# Patient Record
Sex: Female | Born: 1975 | Race: White | Hispanic: No | Marital: Married | Smoking: Former smoker
Health system: Southern US, Community
[De-identification: ages and names within clinical notes are randomized; demographics above are authoritative.]

## PROBLEM LIST (undated history)

## (undated) ENCOUNTER — Inpatient Hospital Stay (HOSPITAL_COMMUNITY): Payer: Self-pay

## (undated) DIAGNOSIS — F419 Anxiety disorder, unspecified: Secondary | ICD-10-CM

## (undated) DIAGNOSIS — Z789 Other specified health status: Secondary | ICD-10-CM

## (undated) HISTORY — PX: NECK SURGERY: SHX720

## (undated) HISTORY — PX: KNEE SURGERY: SHX244

---

## 2012-12-01 ENCOUNTER — Other Ambulatory Visit (HOSPITAL_COMMUNITY)
Admission: RE | Admit: 2012-12-01 | Discharge: 2012-12-01 | Disposition: A | Discharge status: Home / Self Care | Payer: BC Managed Care – PPO | Source: Ambulatory Visit | Attending: Family Medicine | Admitting: Family Medicine

## 2012-12-01 DIAGNOSIS — Z01419 Encounter for gynecological examination (general) (routine) without abnormal findings: Secondary | ICD-10-CM | POA: Insufficient documentation

## 2012-12-03 ENCOUNTER — Other Ambulatory Visit: Payer: Self-pay | Admitting: Family Medicine

## 2012-12-03 DIAGNOSIS — R102 Pelvic and perineal pain: Secondary | ICD-10-CM

## 2012-12-09 ENCOUNTER — Ambulatory Visit
Admission: RE | Admit: 2012-12-09 | Discharge: 2012-12-09 | Disposition: A | Discharge status: Home / Self Care | Payer: BC Managed Care – PPO | Source: Ambulatory Visit | Attending: Family Medicine | Admitting: Family Medicine

## 2012-12-09 DIAGNOSIS — R102 Pelvic and perineal pain: Secondary | ICD-10-CM

## 2013-05-01 ENCOUNTER — Inpatient Hospital Stay (HOSPITAL_COMMUNITY)
Admission: AD | Admit: 2013-05-01 | Discharge: 2013-05-01 | Disposition: A | Discharge status: Home / Self Care | Payer: BC Managed Care – PPO | Source: Ambulatory Visit | Attending: Obstetrics and Gynecology | Admitting: Obstetrics and Gynecology

## 2013-05-01 ENCOUNTER — Inpatient Hospital Stay (HOSPITAL_COMMUNITY): Payer: BC Managed Care – PPO

## 2013-05-01 ENCOUNTER — Encounter (HOSPITAL_COMMUNITY): Payer: Self-pay | Admitting: *Deleted

## 2013-05-01 DIAGNOSIS — O3410 Maternal care for benign tumor of corpus uteri, unspecified trimester: Secondary | ICD-10-CM | POA: Insufficient documentation

## 2013-05-01 DIAGNOSIS — D259 Leiomyoma of uterus, unspecified: Secondary | ICD-10-CM | POA: Insufficient documentation

## 2013-05-01 DIAGNOSIS — O209 Hemorrhage in early pregnancy, unspecified: Secondary | ICD-10-CM | POA: Insufficient documentation

## 2013-05-01 DIAGNOSIS — O99330 Smoking (tobacco) complicating pregnancy, unspecified trimester: Secondary | ICD-10-CM | POA: Insufficient documentation

## 2013-05-01 DIAGNOSIS — R109 Unspecified abdominal pain: Secondary | ICD-10-CM | POA: Insufficient documentation

## 2013-05-01 HISTORY — DX: Other specified health status: Z78.9

## 2013-05-01 LAB — CBC
HEMATOCRIT: 39.3 % (ref 36.0–46.0)
HEMOGLOBIN: 13.8 g/dL (ref 12.0–15.0)
MCH: 34.2 pg — AB (ref 26.0–34.0)
MCHC: 35.1 g/dL (ref 30.0–36.0)
MCV: 97.5 fL (ref 78.0–100.0)
Platelets: 263 10*3/uL (ref 150–400)
RBC: 4.03 MIL/uL (ref 3.87–5.11)
RDW: 11.6 % (ref 11.5–15.5)
WBC: 8.3 10*3/uL (ref 4.0–10.5)

## 2013-05-01 LAB — HCG, QUANTITATIVE, PREGNANCY: hCG, Beta Chain, Quant, S: 1533 m[IU]/mL — ABNORMAL HIGH (ref <5)

## 2013-05-01 LAB — ABO/RH: ABO/RH(D): O NEG

## 2013-05-01 MED ORDER — RHO D IMMUNE GLOBULIN 1500 UNIT/2ML IJ SOLN
300.0000 ug | Freq: Once | INTRAMUSCULAR | Status: AC
  Administered 2013-05-01: 300 ug via INTRAMUSCULAR
  Filled 2013-05-01: qty 2

## 2013-05-01 NOTE — Progress Notes (Signed)
J.Ethier PA in earlier to discuss test results and d/c plan. Written material on Rhophylac given to pt earlier and questions answered. Written and verbal d/c instructions given and understanding voiced.

## 2013-05-01 NOTE — MAU Note (Signed)
About 1100 on Sat was spotting like pink color. That continued throughout the day until night time bleeding bright red and more like a period

## 2013-05-01 NOTE — Discharge Instructions (Signed)
Vaginal Bleeding During Pregnancy, First Trimester °A small amount of bleeding (spotting) from the vagina is relatively common in early pregnancy. It usually stops on its own. Various things may cause bleeding or spotting in early pregnancy. Some bleeding may be related to the pregnancy, and some may not. In most cases, the bleeding is normal and is not a problem. However, bleeding can also be a sign of something serious. Be sure to tell your health care provider about any vaginal bleeding right away. °Some possible causes of vaginal bleeding during the first trimester include: °· Infection or inflammation of the cervix. °· Growths (polyps) on the cervix. °· Miscarriage or threatened miscarriage. °· Pregnancy tissue has developed outside of the uterus and in a fallopian tube (tubal pregnancy). °· Tiny cysts have developed in the uterus instead of pregnancy tissue (molar pregnancy). °HOME CARE INSTRUCTIONS  °Watch your condition for any changes. The following actions may help to lessen any discomfort you are feeling: °· Follow your health care provider's instructions for limiting your activity. If your health care provider orders bed rest, you may need to stay in bed and only get up to use the bathroom. However, your health care provider may allow you to continue light activity. °· If needed, make plans for someone to help with your regular activities and responsibilities while you are on bed rest. °· Keep track of the number of pads you use each day, how often you change pads, and how soaked (saturated) they are. Write this down. °· Do not use tampons. Do not douche. °· Do not have sexual intercourse or orgasms until approved by your health care provider. °· If you pass any tissue from your vagina, save the tissue so you can show it to your health care provider. °· Only take over-the-counter or prescription medicines as directed by your health care provider. °· Do not take aspirin because it can make you  bleed. °· Keep all follow-up appointments as directed by your health care provider. °SEEK MEDICAL CARE IF: °· You have any vaginal bleeding during any part of your pregnancy. °· You have cramps or labor pains. °SEEK IMMEDIATE MEDICAL CARE IF:  °· You have severe cramps in your back or belly (abdomen). °· You have a fever, not controlled by medicine. °· You pass large clots or tissue from your vagina. °· Your bleeding increases. °· You feel lightheaded or weak, or you have fainting episodes. °· You have chills. °· You are leaking fluid or have a gush of fluid from your vagina. °· You pass out while having a bowel movement. °MAKE SURE YOU: °· Understand these instructions. °· Will watch your condition. °· Will get help right away if you are not doing well or get worse. °Document Released: 11/06/2004 Document Revised: 11/17/2012 Document Reviewed: 10/04/2012 °ExitCare® Patient Information ©2014 [employer]. ° °

## 2013-05-01 NOTE — MAU Provider Note (Signed)
History     CSN: [id number]  Arrival date and time: 05/01/13 5465   First Provider Initiated Contact with Patient 05/01/13 0207      Chief Complaint  Patient presents with  . Vaginal Bleeding  . Abdominal Cramping   HPI Ms. Hannah Bullock is a 38 y.o. G1P0 at [redacted]w[redacted]d who presents to MAU today with complaint of vaginal bleeding. The patient states that she had light bleeding since Saturday morning that became heavier since about 2100 Saturday. She has very mild occasional cramps. She denies previous bleeding episodes during this pregnancy. She rates her pain at 1/10. She denies vaginal discharge, UTI symptoms, fever or N/V/D or constipation.   OB History   Grav Para Term Preterm Abortions TAB SAB Ect Mult Living   1               Past Medical History  Diagnosis Date  . Medical history non-contributory     Past Surgical History  Procedure Laterality Date  . Knee surgery      Family History  Problem Relation Age of Onset  . Heart disease Father   . Heart disease Paternal Grandfather     History  Substance Use Topics  . Smoking status: Current Some Day Smoker    Types: Cigarettes  . Smokeless tobacco: Not on file  . Alcohol Use: No    Allergies: No Known Allergies  Prescriptions prior to admission  Medication Sig Dispense Refill  . Prenatal Vit-Fe Fumarate-FA (PRENATAL MULTIVITAMIN) TABS tablet Take 1 tablet by mouth daily at 12 noon.        Review of Systems  Constitutional: Negative for fever and malaise/fatigue.  Gastrointestinal: Positive for abdominal pain. Negative for nausea, vomiting, diarrhea and constipation.  Genitourinary: Negative for dysuria, urgency and frequency.       Neg - vaginal discharge + vaginal bleeding  Neurological: Negative for dizziness and weakness.   Physical Exam   Blood pressure 139/86, pulse 85, temperature 98.1 F (36.7 C), resp. rate 20, height 5\' 4"  (1.626 m), weight 144 lb (65.318 kg), last menstrual period  03/13/2013.  Physical Exam  Constitutional: She is oriented to person, place, and time. She appears well-developed and well-nourished. No distress.  HENT:  Head: Normocephalic and atraumatic.  Cardiovascular: Normal rate, regular rhythm and normal heart sounds.   Respiratory: Effort normal and breath sounds normal. No respiratory distress.  GI: Soft. Bowel sounds are normal. She exhibits no distension and no mass. There is no tenderness. There is no rebound and no guarding.  Genitourinary: Cervix exhibits discharge (active bleeding from the cervical os). Cervix exhibits no motion tenderness and no friability. There is bleeding (scant blood in the vaginal vault) around the vagina. No vaginal discharge found.  Neurological: She is alert and oriented to person, place, and time.  Skin: Skin is warm and dry. No erythema.  Psychiatric: She has a normal mood and affect.  Cervix: closed, thick  Results for orders placed during the hospital encounter of 05/01/13 (from the past 24 hour(s))  CBC     Status: Abnormal   Collection Time    05/01/13 12:40 AM      Result Value Ref Range   WBC 8.3  4.0 - 10.5 K/uL   RBC 4.03  3.87 - 5.11 MIL/uL   Hemoglobin 13.8  12.0 - 15.0 g/dL   HCT 39.3  36.0 - 46.0 %   MCV 97.5  78.0 - 100.0 fL   MCH 34.2 (*) 26.0 - 34.0 pg  MCHC 35.1  30.0 - 36.0 g/dL   RDW 11.6  11.5 - 15.5 %   Platelets 263  150 - 400 K/uL  ABO/RH     Status: None   Collection Time    05/01/13 12:40 AM      Result Value Ref Range   ABO/RH(D) O NEG    HCG, QUANTITATIVE, PREGNANCY     Status: Abnormal   Collection Time    05/01/13 12:40 AM      Result Value Ref Range   hCG, Beta Chain, Quant, S 1533 (*) <5 mIU/mL  RH IG WORKUP (INCLUDES ABO/RH)     Status: None   Collection Time    05/01/13 12:40 AM      Result Value Ref Range   Gestational Age(Wks) 5     ABO/RH(D) O NEG     Antibody Screen NEG     Unit Number [id number]     Blood Component Type RHIG     Unit division 00      Status of Unit ALLOCATED     Transfusion Status OK TO TRANSFUSE     US Ob Comp Less 14 Wks  05/01/2013   CLINICAL DATA:  38 year old G1, LMP 03/13/2013 (7 weeks 0 days), quantitative beta HCG 1,533, presenting with vaginal bleeding.  EXAM: OBSTETRIC <14 WK [place] AND TRANSVAGINAL OB US  TECHNIQUE: Both transabdominal and transvaginal ultrasound examinations were performed for complete evaluation of the gestation as well as the maternal uterus, adnexal regions, and pelvic cul-de-sac. Transvaginal technique was performed to assess early pregnancy.  COMPARISON:  None.  FINDINGS: Intrauterine gestational sac: Single, oval and slightly irregular in shape.  Yolk sac:  Not present.  Embryo:  Not present.  Cardiac Activity: Not applicable.  MSD:  4.5 mm   5 w   0  d  [place] EDC: 01/01/2014.  Maternal uterus/adnexae: No evidence of subchorionic hemorrhage. Left ovary normal in appearance measuring approximately 3.0 x 1.5 x 2.1 cm, containing small follicular cysts. Right ovary normal in appearance measuring approximately 3.1 x 2.3 x 1.9 cm. Approximate 0.9 x 0.8 x 0.9 cm homogeneous solid hypoechoic mass in the left adnexal region immediately adjacent to the uterus. At least 3 measurable uterine fibroids, one in the left uterine body measuring approximately 2.3 x 2.2 x 2.2 cm, another in the right fundus measuring approximately 3.9 x 3.4 x 3.9 cm, and a 3rd in the right uterine body measuring approximately 2.4 x 2.9 x 2.6 cm.  IMPRESSION: 1. Single intrauterine gestational sac, slightly irregular in its shape, without evidence of a yolk sac or embryo at this time. Mean sac diameter yields a gestational age of [redacted] weeks 0 days. 2. Normal-appearing ovaries bilaterally. 3. At least 3 measurable uterine fibroids, measurements given above. 4. Approximate 1 cm homogeneous solid mass in the left adnexal region, query endometrioma.   Electronically Signed   By: Evangeline Dakin M.D.   On: 05/01/2013 02:18   US Ob  Transvaginal  05/01/2013   CLINICAL DATA:  38 year old G1, LMP 03/13/2013 (7 weeks 0 days), quantitative beta HCG 1,533, presenting with vaginal bleeding.  EXAM: OBSTETRIC <14 WK [place] AND TRANSVAGINAL OB US  TECHNIQUE: Both transabdominal and transvaginal ultrasound examinations were performed for complete evaluation of the gestation as well as the maternal uterus, adnexal regions, and pelvic cul-de-sac. Transvaginal technique was performed to assess early pregnancy.  COMPARISON:  None.  FINDINGS: Intrauterine gestational sac: Single, oval and slightly irregular in shape.  Yolk sac:  Not present.  Embryo:  Not present.  Cardiac Activity: Not applicable.  MSD:  4.5 mm   5 w   0  d  [place] EDC: 01/01/2014.  Maternal uterus/adnexae: No evidence of subchorionic hemorrhage. Left ovary normal in appearance measuring approximately 3.0 x 1.5 x 2.1 cm, containing small follicular cysts. Right ovary normal in appearance measuring approximately 3.1 x 2.3 x 1.9 cm. Approximate 0.9 x 0.8 x 0.9 cm homogeneous solid hypoechoic mass in the left adnexal region immediately adjacent to the uterus. At least 3 measurable uterine fibroids, one in the left uterine body measuring approximately 2.3 x 2.2 x 2.2 cm, another in the right fundus measuring approximately 3.9 x 3.4 x 3.9 cm, and a 3rd in the right uterine body measuring approximately 2.4 x 2.9 x 2.6 cm.  IMPRESSION: 1. Single intrauterine gestational sac, slightly irregular in its shape, without evidence of a yolk sac or embryo at this time. Mean sac diameter yields a gestational age of [redacted] weeks 0 days. 2. Normal-appearing ovaries bilaterally. 3. At least 3 measurable uterine fibroids, measurements given above. 4. Approximate 1 cm homogeneous solid mass in the left adnexal region, query endometrioma.   Electronically Signed   By: Evangeline Dakin M.D.   On: 05/01/2013 02:18    MAU Course  Procedures None  MDM CBC, ABO/Rh, quant hCG and [place] today Discussed patient with Dr.  Simona Huh. [place] for discharge after Rhogam injection given. Follow-up in the office Discussed results and precautions with the patient and her husband. Questions answered. ~ 20 minutes spent on patient education.  Rhogam given Assessment and Plan  A: IUGS at [redacted]w[redacted]d without YS or FP Multiple uterine fibroids Vaginal bleeding in pregnancy prior to [redacted] weeks gestation  P: Discharge home Bleeding/Ectopic precautions discussed Patient advised to follow-up in the office or call if her condition were to change or worsen Patient may return to MAU as needed or if her condition were to change or worsen  Farris Has, PA-C  05/01/2013, 3:11 AM

## 2013-05-02 LAB — RH IG WORKUP (INCLUDES ABO/RH)
ABO/RH(D): O NEG
ANTIBODY SCREEN: NEGATIVE
Gestational Age(Wks): 5
UNIT DIVISION: 0

## 2013-12-12 ENCOUNTER — Encounter (HOSPITAL_COMMUNITY): Payer: Self-pay | Admitting: *Deleted

## 2014-03-06 ENCOUNTER — Encounter (HOSPITAL_COMMUNITY): Payer: Self-pay | Admitting: *Deleted

## 2015-06-26 ENCOUNTER — Other Ambulatory Visit: Payer: Self-pay | Admitting: Orthopedic Surgery

## 2015-06-26 DIAGNOSIS — M542 Cervicalgia: Secondary | ICD-10-CM

## 2015-06-27 ENCOUNTER — Ambulatory Visit
Admission: RE | Admit: 2015-06-27 | Discharge: 2015-06-27 | Disposition: A | Discharge status: Home / Self Care | Payer: BLUE CROSS/BLUE SHIELD | Source: Ambulatory Visit | Attending: Orthopedic Surgery | Admitting: Orthopedic Surgery

## 2015-06-27 DIAGNOSIS — M542 Cervicalgia: Secondary | ICD-10-CM

## 2017-02-20 IMAGING — MR MR CERVICAL SPINE W/O CM
5 series · 28 of 48 positions shown · non-contrast
Comparison: None.

CLINICAL DATA: 40-year-old female with cervical neck pain radiating
to the left upper extremity including the shoulder for 1.5 months.
Numbness in the left hand. Status post steroid injection 1 month
ago. Initial encounter.

EXAM:
MRI CERVICAL SPINE WITHOUT CONTRAST
TECHNIQUE: Multiplanar, multisequence MR imaging of the cervical spine was
performed. No intravenous contrast was administered.

[Series 3: T2 · sagittal · 3.0mm · 0.66mm/px · 6 of 12 slices shown (1 of 2)]
[im 1/12]
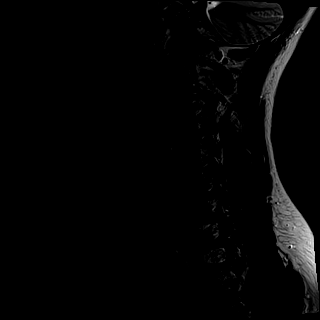
[im 3/12]
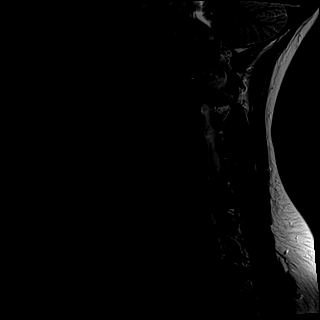
[im 5/12]
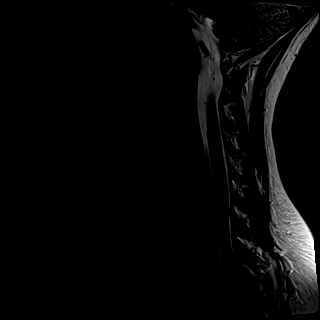
[im 7/12]
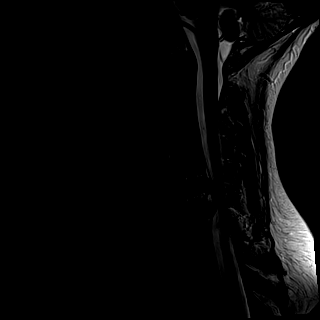
[im 9/12]
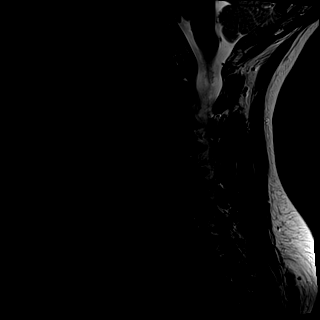
[im 12/12]
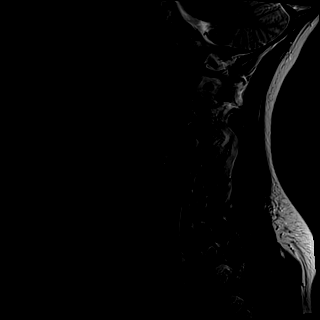

[Series 4: T1 · sagittal · 3.0mm · 0.41mm/px · 6 of 12 slices shown]
[im 1/12]
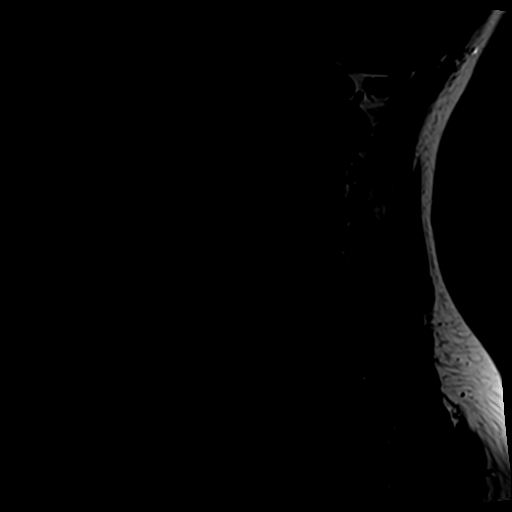
[im 3/12]
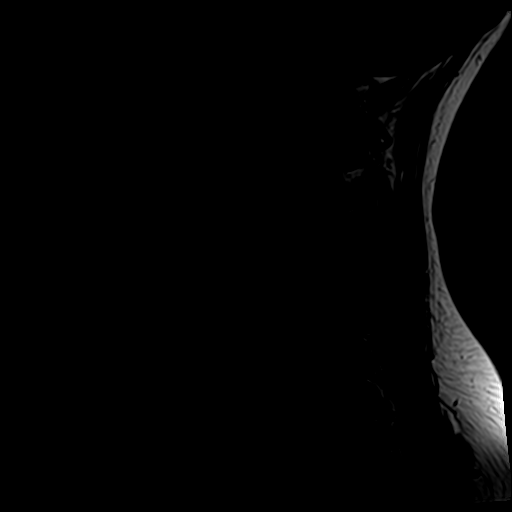
[im 5/12]
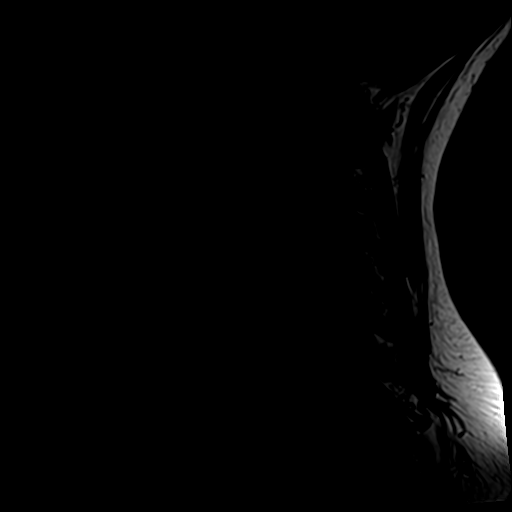
[im 7/12]
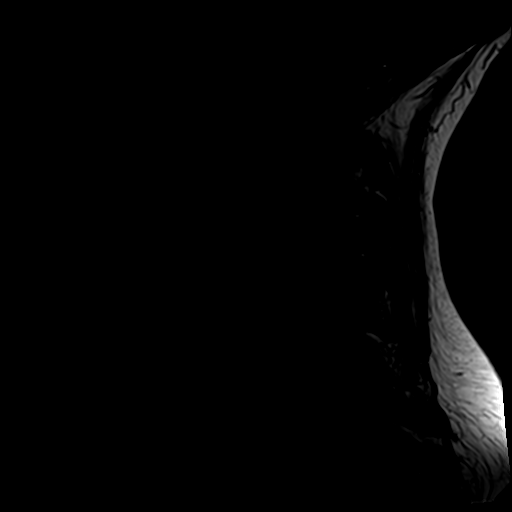
[im 9/12]
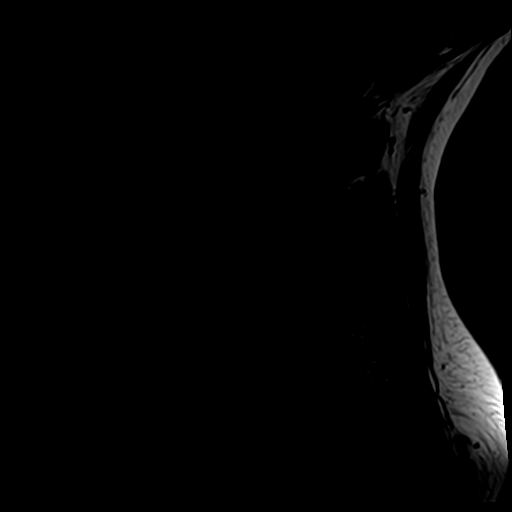
[im 12/12]
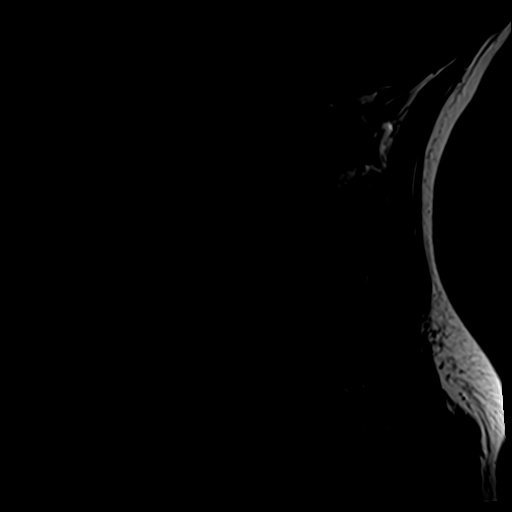

[Series 5: tir sag · sagittal · 3.0mm · 0.41mm/px · 6 of 12 slices shown]
[im 1/12]
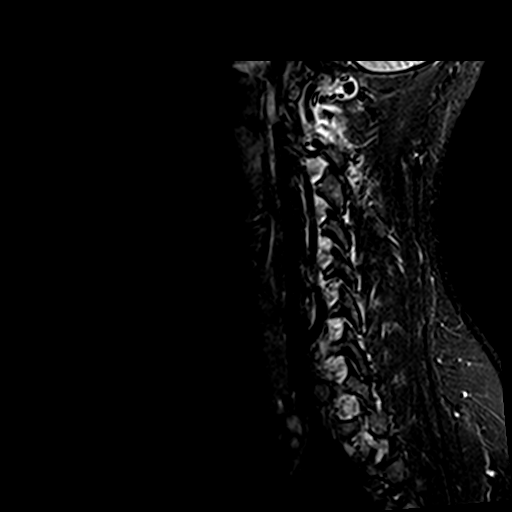
[im 3/12]
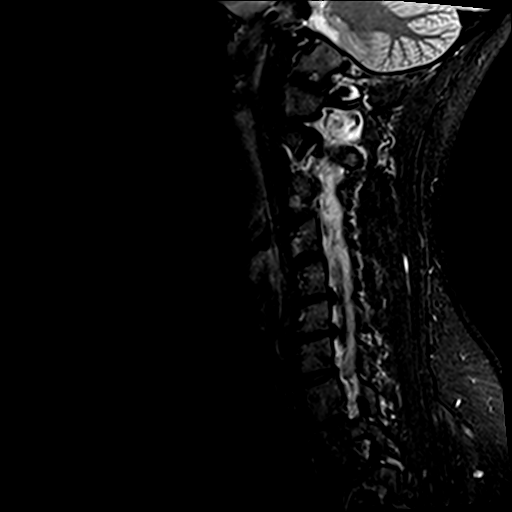
[im 5/12]
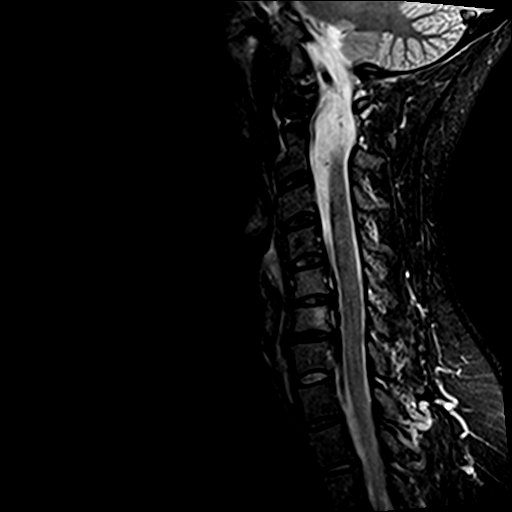
[im 7/12]
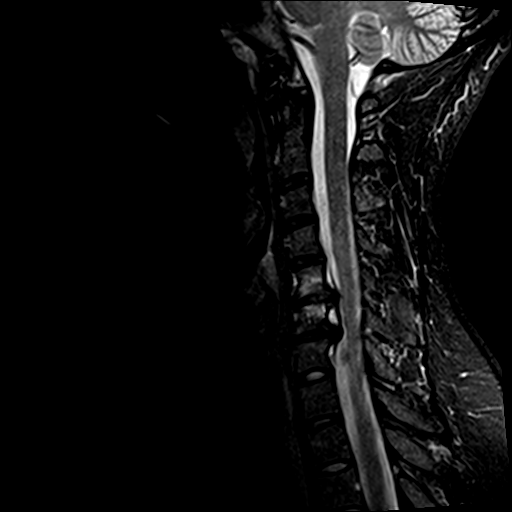
[im 9/12]
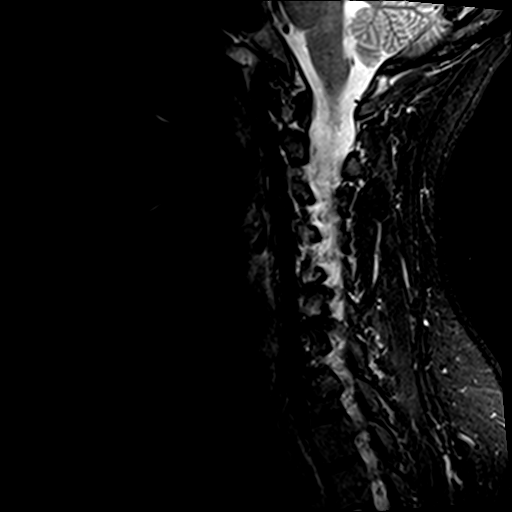
[im 12/12]
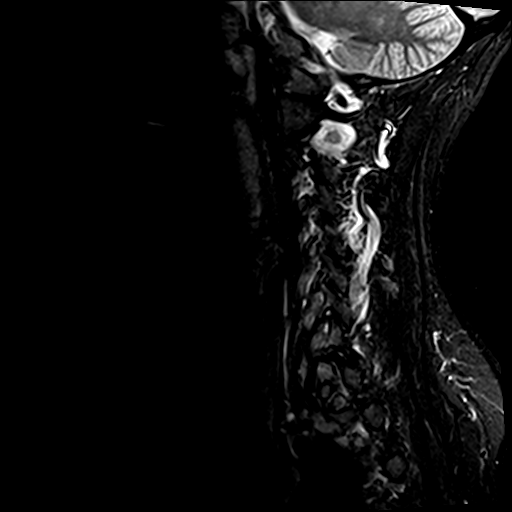

[Series 6: GRE · axial · 3.0mm · 0.35mm/px · 1 of 28 slices shown]
[im 1/28]
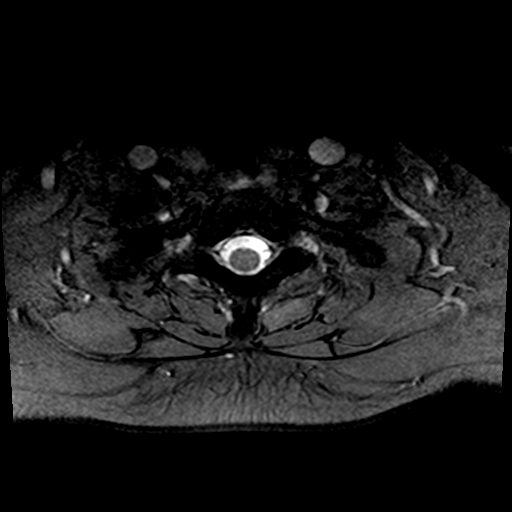

[Series 7: T2 · axial · 3.0mm · 0.70mm/px · z∈[-16,+85]mm · 9 of 28 slices shown (2 of 2)]
[im 1/28]
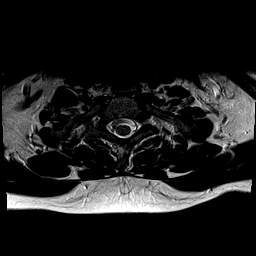
[im 4/28]
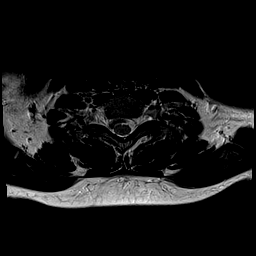
[im 8/28]
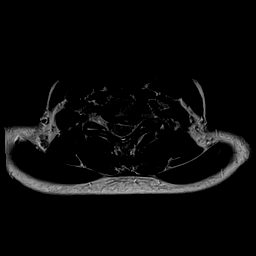
[im 12/28]
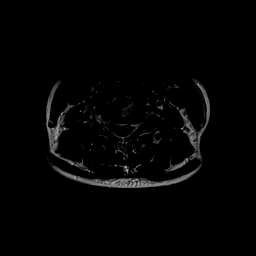
[im 14/28]
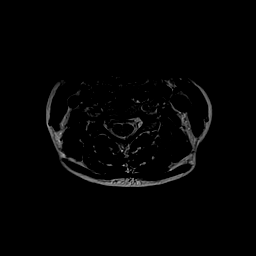
[im 16/28]
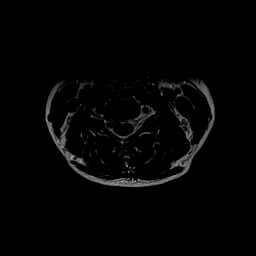
[im 20/28]
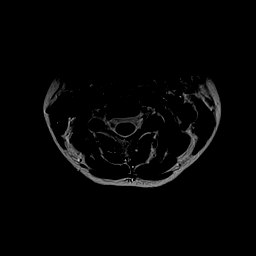
[im 24/28]
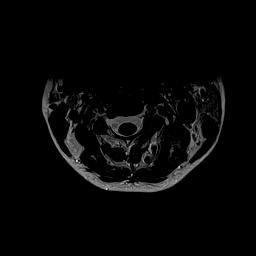
[im 28/28]
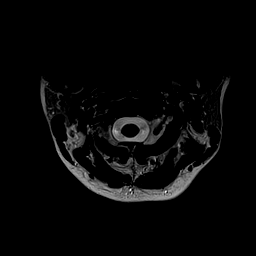

[28 of 48 positions shown; findings below may reference images not displayed]

FINDINGS: Straightening of cervical lordosis, with mild reversal most
pronounced from the C4 to the C6 level. Associated patchy vertebral
body and endplate marrow edema at C5 and C6 appears degenerative in
nature. Associated disc degeneration described below. Marrow signal
elsewhere is within normal limits. No acute osseous abnormality
identified.

Negative visualized posterior fossa. Cervicomedullary junction is
within normal limits. Spinal cord signal is within normal limits at
all visualized levels.

Negative paraspinal soft tissues.

C2-C3:  Negative.

C3-C4:  Negative.

C4-C5:  Negative.

C5-C6: Disc space loss with left eccentric circumferential disc
osteophyte complex. Broad-based posterior component of disc (series
6, image 18). Spinal stenosis with mild if any spinal cord mass
effect. Disc and uncovertebral hypertrophy affecting left foramen
with mild to moderate left C6 foraminal stenosis.

C6-C7: Disc space loss. Circumferential disc osteophyte complex.
Broad-based left eccentric and subarticular component of disc
(series 6, image 21). Spinal stenosis with mild spinal cord mass
effect. Severe left C7 foraminal stenosis related to uncovertebral
hypertrophy and disc. No significant right foraminal involvement.

C7-T1:  Mild facet hypertrophy on the left.

No upper thoracic spinal stenosis.
IMPRESSION: 1. Disc and endplate degeneration at C5-C6 and C6-C7 resulting in
spinal and left neural foraminal stenosis at each level.
Up to mild spinal cord mass effect occurs with no cord signal
abnormality. Up to moderate C6 and severe C7 left neural foraminal
stenosis.
2. Patchy marrow edema in the C5 and C6 vertebral bodies appears to
be degenerative in nature.

## 2017-04-06 ENCOUNTER — Encounter (HOSPITAL_COMMUNITY): Payer: Self-pay | Admitting: Emergency Medicine

## 2017-04-06 ENCOUNTER — Observation Stay (HOSPITAL_COMMUNITY): Payer: BLUE CROSS/BLUE SHIELD | Admitting: Certified Registered Nurse Anesthetist

## 2017-04-06 ENCOUNTER — Observation Stay (HOSPITAL_COMMUNITY)
Admission: EM | Admit: 2017-04-06 | Discharge: 2017-04-06 | Disposition: A | Discharge status: Home / Self Care | Payer: BLUE CROSS/BLUE SHIELD | Attending: Internal Medicine | Admitting: Internal Medicine

## 2017-04-06 ENCOUNTER — Encounter (HOSPITAL_COMMUNITY)
Admission: EM | Disposition: A | Discharge status: Home / Self Care | Payer: Self-pay | Source: Home / Self Care | Attending: Emergency Medicine

## 2017-04-06 DIAGNOSIS — K250 Acute gastric ulcer with hemorrhage: Secondary | ICD-10-CM

## 2017-04-06 DIAGNOSIS — K920 Hematemesis: Secondary | ICD-10-CM

## 2017-04-06 DIAGNOSIS — K259 Gastric ulcer, unspecified as acute or chronic, without hemorrhage or perforation: Secondary | ICD-10-CM | POA: Diagnosis not present

## 2017-04-06 DIAGNOSIS — R1110 Vomiting, unspecified: Secondary | ICD-10-CM

## 2017-04-06 DIAGNOSIS — K210 Gastro-esophageal reflux disease with esophagitis: Secondary | ICD-10-CM | POA: Diagnosis not present

## 2017-04-06 DIAGNOSIS — D62 Acute posthemorrhagic anemia: Secondary | ICD-10-CM | POA: Diagnosis not present

## 2017-04-06 DIAGNOSIS — K319 Disease of stomach and duodenum, unspecified: Secondary | ICD-10-CM | POA: Insufficient documentation

## 2017-04-06 DIAGNOSIS — Z791 Long term (current) use of non-steroidal anti-inflammatories (NSAID): Secondary | ICD-10-CM | POA: Diagnosis not present

## 2017-04-06 DIAGNOSIS — F1010 Alcohol abuse, uncomplicated: Secondary | ICD-10-CM | POA: Diagnosis not present

## 2017-04-06 DIAGNOSIS — K253 Acute gastric ulcer without hemorrhage or perforation: Secondary | ICD-10-CM | POA: Diagnosis not present

## 2017-04-06 DIAGNOSIS — K921 Melena: Secondary | ICD-10-CM | POA: Diagnosis not present

## 2017-04-06 DIAGNOSIS — K922 Gastrointestinal hemorrhage, unspecified: Secondary | ICD-10-CM | POA: Diagnosis present

## 2017-04-06 DIAGNOSIS — K3189 Other diseases of stomach and duodenum: Secondary | ICD-10-CM | POA: Diagnosis not present

## 2017-04-06 DIAGNOSIS — Z7982 Long term (current) use of aspirin: Secondary | ICD-10-CM | POA: Insufficient documentation

## 2017-04-06 DIAGNOSIS — Z79899 Other long term (current) drug therapy: Secondary | ICD-10-CM | POA: Insufficient documentation

## 2017-04-06 DIAGNOSIS — R55 Syncope and collapse: Secondary | ICD-10-CM | POA: Diagnosis present

## 2017-04-06 DIAGNOSIS — F17210 Nicotine dependence, cigarettes, uncomplicated: Secondary | ICD-10-CM | POA: Insufficient documentation

## 2017-04-06 DIAGNOSIS — Z720 Tobacco use: Secondary | ICD-10-CM | POA: Diagnosis present

## 2017-04-06 DIAGNOSIS — K2970 Gastritis, unspecified, without bleeding: Secondary | ICD-10-CM | POA: Diagnosis not present

## 2017-04-06 HISTORY — PX: ESOPHAGOGASTRODUODENOSCOPY: SHX5428

## 2017-04-06 LAB — TYPE AND SCREEN
ABO/RH(D): O NEG
ANTIBODY SCREEN: NEGATIVE

## 2017-04-06 LAB — CBC
HEMATOCRIT: 26.6 % — AB (ref 36.0–46.0)
HEMATOCRIT: 23 % — AB (ref 36.0–46.0)
HEMOGLOBIN: 7.6 g/dL — AB (ref 12.0–15.0)
Hemoglobin: 8.8 g/dL — ABNORMAL LOW (ref 12.0–15.0)
MCH: 32 pg (ref 26.0–34.0)
MCH: 31.8 pg (ref 26.0–34.0)
MCHC: 33.1 g/dL (ref 30.0–36.0)
MCHC: 33 g/dL (ref 30.0–36.0)
MCV: 96.7 fL (ref 78.0–100.0)
MCV: 96.2 fL (ref 78.0–100.0)
Platelets: 290 10*3/uL (ref 150–400)
Platelets: 256 10*3/uL (ref 150–400)
RBC: 2.75 MIL/uL — AB (ref 3.87–5.11)
RBC: 2.39 MIL/uL — ABNORMAL LOW (ref 3.87–5.11)
RDW: 13.1 % (ref 11.5–15.5)
RDW: 13.1 % (ref 11.5–15.5)
WBC: 10.7 10*3/uL — ABNORMAL HIGH (ref 4.0–10.5)
WBC: 9 10*3/uL (ref 4.0–10.5)

## 2017-04-06 LAB — COMPREHENSIVE METABOLIC PANEL
ALBUMIN: 3.2 g/dL — AB (ref 3.5–5.0)
ALT: 21 U/L (ref 14–54)
AST: 28 U/L (ref 15–41)
Alkaline Phosphatase: 57 U/L (ref 38–126)
Anion gap: 7 (ref 5–15)
BILIRUBIN TOTAL: 0.4 mg/dL (ref 0.3–1.2)
BUN: 29 mg/dL — ABNORMAL HIGH (ref 6–20)
CO2: 21 mmol/L — ABNORMAL LOW (ref 22–32)
Calcium: 7.9 mg/dL — ABNORMAL LOW (ref 8.9–10.3)
Chloride: 109 mmol/L (ref 101–111)
Creatinine, Ser: 0.71 mg/dL (ref 0.44–1.00)
GFR calc non Af Amer: >60 mL/min (ref >60)
Glucose, Bld: 115 mg/dL — ABNORMAL HIGH (ref 65–99)
POTASSIUM: 4.3 mmol/L (ref 3.5–5.1)
SODIUM: 137 mmol/L (ref 135–145)
TOTAL PROTEIN: 5.4 g/dL — AB (ref 6.5–8.1)

## 2017-04-06 LAB — PREGNANCY, URINE: Preg Test, Ur: NEGATIVE

## 2017-04-06 LAB — PROTIME-INR
INR: 1.25
Prothrombin Time: 15.6 seconds — ABNORMAL HIGH (ref 11.4–15.2)

## 2017-04-06 LAB — APTT: APTT: 26 s (ref 24–36)

## 2017-04-06 LAB — ABO/RH: ABO/RH(D): O NEG

## 2017-04-06 LAB — I-STAT BETA HCG BLOOD, ED (MC, WL, AP ONLY)

## 2017-04-06 LAB — HEMOGLOBIN AND HEMATOCRIT, BLOOD
HCT: 25.6 % — ABNORMAL LOW (ref 36.0–46.0)
Hemoglobin: 8.6 g/dL — ABNORMAL LOW (ref 12.0–15.0)

## 2017-04-06 LAB — OCCULT BLOOD GASTRIC / DUODENUM (SPECIMEN CUP): OCCULT BLOOD, GASTRIC: POSITIVE — AB

## 2017-04-06 LAB — POC OCCULT BLOOD, ED: FECAL OCCULT BLD: POSITIVE — AB

## 2017-04-06 LAB — ETHANOL: Alcohol, Ethyl (B): <10 mg/dL (ref <10)

## 2017-04-06 SURGERY — EGD (ESOPHAGOGASTRODUODENOSCOPY)
Anesthesia: Monitor Anesthesia Care

## 2017-04-06 MED ORDER — LORAZEPAM 1 MG PO TABS: 1.0000 mg | ORAL_TABLET | Freq: Four times a day (QID) | ORAL | Status: DC | PRN

## 2017-04-06 MED ORDER — MIDAZOLAM HCL 5 MG/5ML IJ SOLN
INTRAMUSCULAR | Status: DC | PRN
  Administered 2017-04-06: 2 mg via INTRAVENOUS

## 2017-04-06 MED ORDER — SODIUM CHLORIDE 0.9 % IV SOLN
INTRAVENOUS | Status: DC
  Administered 2017-04-06: 10:00:00 via INTRAVENOUS

## 2017-04-06 MED ORDER — FERROUS SULFATE 325 (65 FE) MG PO TBEC: 325.0000 mg | DELAYED_RELEASE_TABLET | Freq: Two times a day (BID) | ORAL | 3 refills | Status: DC

## 2017-04-06 MED ORDER — PANTOPRAZOLE SODIUM 40 MG PO TBEC: 40.0000 mg | DELAYED_RELEASE_TABLET | Freq: Two times a day (BID) | ORAL | 3 refills | Status: DC

## 2017-04-06 MED ORDER — ONDANSETRON HCL 4 MG/2ML IJ SOLN
4.0000 mg | Freq: Four times a day (QID) | INTRAMUSCULAR | Status: DC | PRN
Start: 2017-04-06 — End: 2017-04-06

## 2017-04-06 MED ORDER — LORAZEPAM 2 MG/ML IJ SOLN: 0.0000 mg | Freq: Four times a day (QID) | INTRAMUSCULAR | Status: DC

## 2017-04-06 MED ORDER — PANTOPRAZOLE SODIUM 40 MG IV SOLR
80.0000 mg | Freq: Once | INTRAVENOUS | Status: AC
  Administered 2017-04-06: 80 mg via INTRAVENOUS
  Filled 2017-04-06: qty 80

## 2017-04-06 MED ORDER — SODIUM CHLORIDE 0.9 % IV SOLN
INTRAVENOUS | Status: DC
  Administered 2017-04-06: 05:00:00 via INTRAVENOUS

## 2017-04-06 MED ORDER — ONDANSETRON HCL 4 MG/2ML IJ SOLN
INTRAMUSCULAR | Status: DC | PRN
  Administered 2017-04-06: 4 mg via INTRAVENOUS

## 2017-04-06 MED ORDER — SODIUM CHLORIDE 0.9 % IV BOLUS (SEPSIS)
1000.0000 mL | Freq: Once | INTRAVENOUS | Status: AC
  Administered 2017-04-06: 1000 mL via INTRAVENOUS

## 2017-04-06 MED ORDER — ACETAMINOPHEN 325 MG PO TABS
650.0000 mg | ORAL_TABLET | Freq: Four times a day (QID) | ORAL | Status: DC | PRN
  Administered 2017-04-06: 650 mg via ORAL
  Filled 2017-04-06: qty 2

## 2017-04-06 MED ORDER — SODIUM CHLORIDE 0.9 % IV SOLN
8.0000 mg/h | INTRAVENOUS | Status: DC
  Administered 2017-04-06: 8 mg/h via INTRAVENOUS
  Filled 2017-04-06 (×2): qty 80

## 2017-04-06 MED ORDER — PROPOFOL 10 MG/ML IV BOLUS
INTRAVENOUS | Status: DC | PRN
  Administered 2017-04-06: 20 mg via INTRAVENOUS
  Administered 2017-04-06: 10 mg via INTRAVENOUS

## 2017-04-06 MED ORDER — FOLIC ACID 1 MG PO TABS
1.0000 mg | ORAL_TABLET | Freq: Every day | ORAL | Status: DC
  Administered 2017-04-06: 1 mg via ORAL
  Filled 2017-04-06: qty 1

## 2017-04-06 MED ORDER — LIDOCAINE 2% (20 MG/ML) 5 ML SYRINGE
INTRAMUSCULAR | Status: DC | PRN
  Administered 2017-04-06: 80 mg via INTRAVENOUS

## 2017-04-06 MED ORDER — PANTOPRAZOLE SODIUM 40 MG PO TBEC
40.0000 mg | DELAYED_RELEASE_TABLET | Freq: Two times a day (BID) | ORAL | Status: DC
  Administered 2017-04-06: 40 mg via ORAL
  Filled 2017-04-06: qty 1

## 2017-04-06 MED ORDER — DEXMEDETOMIDINE HCL 200 MCG/2ML IV SOLN
INTRAVENOUS | Status: DC | PRN
  Administered 2017-04-06 (×2): 8 ug via INTRAVENOUS
  Administered 2017-04-06: 4 ug via INTRAVENOUS

## 2017-04-06 MED ORDER — THIAMINE HCL 100 MG/ML IJ SOLN: 100.0000 mg | Freq: Every day | INTRAMUSCULAR | Status: DC

## 2017-04-06 MED ORDER — MIDAZOLAM HCL 2 MG/2ML IJ SOLN
INTRAMUSCULAR | Status: AC
  Filled 2017-04-06: qty 2

## 2017-04-06 MED ORDER — LORAZEPAM 2 MG/ML IJ SOLN: 1.0000 mg | Freq: Four times a day (QID) | INTRAMUSCULAR | Status: DC | PRN

## 2017-04-06 MED ORDER — PROPOFOL 500 MG/50ML IV EMUL
INTRAVENOUS | Status: DC | PRN
  Administered 2017-04-06: 200 ug/kg/min via INTRAVENOUS

## 2017-04-06 MED ORDER — VITAMIN B-1 100 MG PO TABS
100.0000 mg | ORAL_TABLET | Freq: Every day | ORAL | Status: DC
  Administered 2017-04-06: 100 mg via ORAL
  Filled 2017-04-06: qty 1

## 2017-04-06 MED ORDER — ONDANSETRON HCL 4 MG PO TABS: 4.0000 mg | ORAL_TABLET | Freq: Four times a day (QID) | ORAL | Status: DC | PRN

## 2017-04-06 MED ORDER — ONDANSETRON HCL 4 MG/2ML IJ SOLN
4.0000 mg | Freq: Once | INTRAMUSCULAR | Status: AC
  Administered 2017-04-06: 4 mg via INTRAVENOUS
  Filled 2017-04-06: qty 2

## 2017-04-06 MED ORDER — ACETAMINOPHEN 650 MG RE SUPP: 650.0000 mg | Freq: Four times a day (QID) | RECTAL | Status: DC | PRN

## 2017-04-06 MED ORDER — LORAZEPAM 2 MG/ML IJ SOLN: 0.0000 mg | Freq: Two times a day (BID) | INTRAMUSCULAR | Status: DC

## 2017-04-06 MED ORDER — ADULT MULTIVITAMIN W/MINERALS CH
1.0000 | ORAL_TABLET | Freq: Every day | ORAL | Status: DC
  Administered 2017-04-06: 1 via ORAL
  Filled 2017-04-06: qty 1

## 2017-04-06 MED ORDER — PANTOPRAZOLE SODIUM 40 MG IV SOLR: 40.0000 mg | Freq: Two times a day (BID) | INTRAVENOUS | Status: DC

## 2017-04-06 MED ORDER — KETAMINE HCL-SODIUM CHLORIDE 100-0.9 MG/10ML-% IV SOSY
PREFILLED_SYRINGE | INTRAVENOUS | Status: AC
Start: 2017-04-06 — End: 2017-04-06
  Filled 2017-04-06: qty 10

## 2017-04-06 NOTE — H&P (Signed)
History and Physical    Hannah Bullock RUE:[id number] DOB: 1975/11/19 DOA: 04/06/2017  PCP: Patient, No Pcp Per  Patient coming from: Home.  Chief Complaint: Throwing up blood.  HPI: Hannah Bullock is a 42 y.o. female with history of alcoholism and tobacco abuse who uses Goody powders every day and has been recently using ibuprofen because patient had upper respiratory tract infection presents to the ER after patient had multiple episodes of frank upper GI bleed.  Patient started throwing up coffee-ground later on patient had projectile hematemesis.  Patient has been noticing some black stool last few days.  During 1 of the episodes of hematemesis patient lost consciousness but did not hit her head because patient was already on the floor.  Admits to drinking alcohol every day.  EMS on arrival found patient to be hypotensive.  ED Course: In the ER patient was given fluid bolus following which blood pressure improved.  Hemoglobin is around 8.  Last hemoglobin in our system is around 13 in 2015.  Stool for occult blood is positive.  Protonix infusion has been started and patient admitted for acute GI bleed likely from upper GI.  Patient denies any abdominal pain.  Review of Systems: As per HPI, rest all negative.   Past Medical History:  Diagnosis Date  . Medical history non-contributory     Past Surgical History:  Procedure Laterality Date  . KNEE SURGERY    . NECK SURGERY       reports that she has been smoking cigarettes.  she has never used smokeless tobacco. She reports that she drinks alcohol. She reports that she does not use drugs.  No Known Allergies  Family History  Problem Relation Age of Onset  . Heart disease Father   . Heart disease Paternal Grandfather     Prior to Admission medications   Medication Sig Start Date End Date Taking? Authorizing Provider  Aspirin-Salicylamide-Caffeine (BC HEADACHE POWDER PO) Take 1 packet by mouth as needed (for pain).   Yes [provider]  calcium carbonate (TUMS - DOSED IN MG ELEMENTAL CALCIUM) 500 MG chewable tablet Chew 1 tablet by mouth daily as needed for indigestion or heartburn.   Yes [provider]  DM-Doxylamine-Acetaminophen (NYQUIL HBP COLD & FLU) 15-6.25-325 MG/15ML LIQD Take 15 mLs by mouth as needed (for cough and sleep).   Yes [provider]  ibuprofen (ADVIL,MOTRIN) 200 MG tablet Take 200 mg by mouth every 6 (six) hours as needed for headache or mild pain.   Yes [provider]  Pheniramine-PE-APAP (THERAFLU FLU & SORE THROAT) 20-10-650 MG PACK Take 1 Package by mouth as needed (for sinus).   Yes [provider]  pseudoephedrine-acetaminophen (TYLENOL SINUS) 30-500 MG TABS tablet Take 1 tablet by mouth every 4 (four) hours as needed (for congestion).   Yes [provider]    Physical Exam: Vitals:   04/06/17 0500 04/06/17 0545 04/06/17 0559 04/06/17 0600  BP: 104/69 123/75  138/83  Pulse: 93 (!) 110  (!) 122  Resp: 16 18  14   Temp:   98.5 F (36.9 C)   TempSrc:   Oral   SpO2: 100% 100%  100%      Constitutional: Moderately built and nourished. Vitals:   04/06/17 0500 04/06/17 0545 04/06/17 0559 04/06/17 0600  BP: 104/69 123/75  138/83  Pulse: 93 (!) 110  (!) 122  Resp: 16 18  14   Temp:   98.5 F (36.9 C)   TempSrc:   Oral  SpO2: 100% 100%  100%   Eyes: Anicteric no pallor. ENMT: No discharge from the ears eyes nose or mouth. Neck: No mass felt.  No neck rigidity. Respiratory: No rhonchi or crepitations. Cardiovascular: S1-S2 heard no murmurs appreciated. Abdomen: Soft nontender bowel sounds present. Musculoskeletal: No edema.  No joint effusion. Skin: No rash.  Skin appears warm. Neurologic: Alert awake oriented to time place and person.  Moves all extremity's. Psychiatric: Appears normal.  Normal affect.   Labs on Admission: I have personally reviewed following labs and imaging studies  CBC: Recent Labs  Lab 04/06/17 0408    WBC 10.7*  HGB 8.8*  HCT 26.6*  MCV 96.7  PLT 244   Basic Metabolic Panel: Recent Labs  Lab 04/06/17 0408  NA 137  K 4.3  CL 109  CO2 21*  GLUCOSE 115*  BUN 29*  CREATININE 0.71  CALCIUM 7.9*   GFR: CrCl cannot be calculated (Unknown ideal weight.). Liver Function Tests: Recent Labs  Lab 04/06/17 0408  AST 28  ALT 21  ALKPHOS 57  BILITOT 0.4  PROT 5.4*  ALBUMIN 3.2*   No results for input(s): LIPASE, AMYLASE in the last 168 hours. No results for input(s): AMMONIA in the last 168 hours. Coagulation Profile: Recent Labs  Lab 04/06/17 0454  INR 1.25   Cardiac Enzymes: No results for input(s): CKTOTAL, CKMB, CKMBINDEX, TROPONINI in the last 168 hours. BNP (last 3 results) No results for input(s): PROBNP in the last 8760 hours. HbA1C: No results for input(s): HGBA1C in the last 72 hours. CBG: No results for input(s): GLUCAP in the last 168 hours. Lipid Profile: No results for input(s): CHOL, HDL, LDLCALC, TRIG, CHOLHDL, LDLDIRECT in the last 72 hours. Thyroid Function Tests: No results for input(s): TSH, T4TOTAL, FREET4, T3FREE, THYROIDAB in the last 72 hours. Anemia Panel: No results for input(s): VITAMINB12, FOLATE, FERRITIN, TIBC, IRON, RETICCTPCT in the last 72 hours. Urine analysis: No results found for: COLORURINE, APPEARANCEUR, LABSPEC, PHURINE, GLUCOSEU, HGBUR, BILIRUBINUR, KETONESUR, PROTEINUR, UROBILINOGEN, NITRITE, LEUKOCYTESUR Sepsis Labs: @LABRCNTIP (procalcitonin:4,lacticidven:4) )No results found for this or any previous visit (from the past 240 hour(s)).   Radiological Exams on Admission: No results found.  EKG: Independently reviewed.  Sinus tachycardia QTC of 439 ms.  Assessment/Plan Principal Problem:   Acute GI bleeding Active Problems:   Acute blood loss anemia   Alcohol abuse   Tobacco abuse    1. Acute GI bleeding -likely upper GI bleed.  Patient is placed on Protonix infusion follow CBC closely.  Patient's blood pressure  has improved with fluid bolus.  Consult gastroenterologist in a.m. 2. Alcohol abuse -patient is placed on CIWA protocol. 3. Tobacco abuse -tobacco cessation counseling requested. 4. Acute blood loss anemia -follow CBC.   DVT prophylaxis: SCDs. Code Status: Full code. Family Communication: Discussed with patient. Disposition Plan: Home. Consults called: None. Admission status: Inpatient.   Rise Patience MD [facility] Pager [phone].  If 7PM-7AM, please contact night-coverage [email or web address] Password [facility]  04/06/2017, 6:13 AM

## 2017-04-06 NOTE — Discharge Instructions (Signed)
YOU HAD AN ENDOSCOPIC PROCEDURE TODAY: Refer to the procedure report and other information in the discharge instructions given to you for any specific questions about what was found during the examination. If this information does not answer your questions, please call [place] office at [phone] to clarify.   YOU SHOULD EXPECT: Some feelings of bloating in the abdomen. Passage of more gas than usual. Walking can help get rid of the air that was put into your GI tract during the procedure and reduce the bloating. If you had a lower endoscopy (such as a colonoscopy or flexible sigmoidoscopy) you may notice spotting of blood in your stool or on the toilet paper. Some abdominal soreness may be present for a day or two, also.  DIET: Your first meal following the procedure should be a light meal and then it is ok to progress to your normal diet. A half-sandwich or bowl of soup is an example of a good first meal. Heavy or fried foods are harder to digest and may make you feel nauseous or bloated. Drink plenty of fluids but you should avoid alcoholic beverages for 24 hours. If you had a esophageal dilation, please see attached instructions for diet.    ACTIVITY: Your care partner should take you home directly after the procedure. You should plan to take it easy, moving slowly for the rest of the day. You can resume normal activity the day after the procedure however YOU SHOULD NOT DRIVE, use power tools, machinery or perform tasks that involve climbing or major physical exertion for 24 hours (because of the sedation medicines used during the test).   SYMPTOMS TO REPORT IMMEDIATELY: A gastroenterologist can be reached at any hour. Please call [phone]  for any of the following symptoms:  Monitored Anesthesia Care, Care After These instructions provide you with information about caring for yourself after your procedure. Your health care provider may also give you more specific instructions. Your treatment  has been planned according to current medical practices, but problems sometimes occur. Call your health care provider if you have any problems or questions after your procedure. What can I expect after the procedure? After your procedure, it is common to:  Feel sleepy for several hours.  Feel clumsy and have poor balance for several hours.  Feel forgetful about what happened after the procedure.  Have poor judgment for several hours.  Feel nauseous or vomit.  Have a sore throat if you had a breathing tube during the procedure.  Follow these instructions at home: For at least 24 hours after the procedure:   Do not: ? Participate in activities in which you could fall or become injured. ? Drive. ? Use heavy machinery. ? Drink alcohol. ? Take sleeping pills or medicines that cause drowsiness. ? Make important decisions or sign legal documents. ? Take care of children on your own.  Rest. Eating and drinking  Follow the diet that is recommended by your health care provider.  If you vomit, drink water, juice, or soup when you can drink without vomiting.  Make sure you have little or no nausea before eating solid foods. General instructions  Have a responsible adult stay with you until you are awake and alert.  Take over-the-counter and prescription medicines only as told by your health care provider.  If you smoke, do not smoke without supervision.  Keep all follow-up visits as told by your health care provider. This is important. Contact a health care provider if:  You keep feeling nauseous  or you keep vomiting.  You feel light-headed.  You develop a rash.  You have a fever. Get help right away if:  You have trouble breathing. This information is not intended to replace advice given to you by your health care provider. Make sure you discuss any questions you have with your health care provider. Document Released: 05/20/2015 Document Revised: 09/19/2015 Document  Reviewed: 05/20/2015 Elsevier Interactive Patient Education  2018 [employer].   Following upper endoscopy (EGD, EUS, ERCP, esophageal dilation) Vomiting of blood or coffee ground material  New, significant abdominal pain  New, significant chest pain or pain under the shoulder blades  Painful or persistently difficult swallowing  New shortness of breath  Black, tarry-looking or red, bloody stools  FOLLOW UP:  If any biopsies were taken you will be contacted by phone or by letter within the next 1-3 weeks. Call [phone]  if you have not heard about the biopsies in 3 weeks.  Please also call with any specific questions about appointments or follow up tests.

## 2017-04-06 NOTE — Anesthesia Preprocedure Evaluation (Addendum)
Anesthesia Evaluation  Patient identified by MRN, date of birth, ID band Patient awake    Reviewed: Allergy & Precautions, H&P , NPO status , Patient's Chart, lab work & pertinent test results  Airway Mallampati: III  TM Distance: >3 FB Neck ROM: Full    Dental no notable dental hx. (+) Teeth Intact, Dental Advisory Given   Pulmonary Current Smoker, former smoker,    Pulmonary exam normal breath sounds clear to auscultation       Cardiovascular negative cardio ROS   Rhythm:Regular Rate:Normal     Neuro/Psych negative neurological ROS  negative psych ROS   GI/Hepatic negative GI ROS, (+)     substance abuse  alcohol use,   Endo/Other  negative endocrine ROS  Renal/GU negative Renal ROS  negative genitourinary   Musculoskeletal   Abdominal   Peds  Hematology negative hematology ROS (+) anemia ,   Anesthesia Other Findings   Reproductive/Obstetrics negative OB ROS                            Anesthesia Physical Anesthesia Plan  ASA: III  Anesthesia Plan: MAC   Post-op Pain Management:    Induction: Intravenous  PONV Risk Score and Plan: 2 and Propofol infusion and Ondansetron  Airway Management Planned: Nasal Cannula  Additional Equipment:   Intra-op Plan:   Post-operative Plan:   Informed Consent: I have reviewed the patients History and Physical, chart, labs and discussed the procedure including the risks, benefits and alternatives for the proposed anesthesia with the patient or authorized representative who has indicated his/her understanding and acceptance.   Dental advisory given  Plan Discussed with: CRNA and Surgeon  Anesthesia Plan Comments:        Anesthesia Quick Evaluation

## 2017-04-06 NOTE — Discharge Summary (Signed)
Physician Discharge Summary  Hannah Bullock FXT:[id number] DOB: 01-26-1976 DOA: 04/06/2017  PCP: Patient, No Pcp Per  Admit date: 04/06/2017 Discharge date: 04/06/2017  Admitted From: home Disposition:  Home  Recommendations for Outpatient Follow-up:  1. Follow up with GI in 1-2 weeks 2. Please obtain BMP/CBC in one week 3.   Home Health:No Equipment/Devices:None  Discharge Condition:stable CODE STATUS:full Diet recommendation: Regular   Brief/Interim Summary: 42 y.o. female past medical history of alcohol, tobacco abuse N Goody powder use as well as ibuprofen for an upper respiratory tract infection comes into the ED with hematemesis,    Discharge Diagnoses:  Principal Problem:   Acute gastric ulcer with hemorrhage Active Problems:   Acute blood loss anemia   Alcohol abuse   Tobacco abuse   Upper GI bleed   Coffee ground emesis  Acute GI bleeding/Acute blood loss anemia: Baseline Hbg on 2015 was 16. On admission 8.6. With 6 episodes of hematemesis, will type and screen 2 units of packed red blood cells. EGD was done that showed gastritis with a nonbleeding ulcer likely due to NSAID use. She was changed to oral Protonix twice a day which she will continue as an outpatient. Follow-up with GI as an outpatient.  Alcohol abuse Monitor with serial protocol no signs of withdrawal she was given thiamine and folate on admission counseling was performed.  Tobacco abuse Counseling.   Discharge Instructions  Discharge Instructions    Diet - low sodium heart healthy   Complete by:  As directed    Increase activity slowly   Complete by:  As directed      Allergies as of 04/06/2017   No Known Allergies     Medication List    STOP taking these medications   BC HEADACHE POWDER PO   ibuprofen 200 MG tablet Commonly known as:  ADVIL,MOTRIN   NYQUIL HBP COLD & FLU 15-6.25-325 MG/15ML Liqd Generic drug:  DM-Doxylamine-Acetaminophen     TAKE these medications    calcium carbonate 500 MG chewable tablet Commonly known as:  TUMS - dosed in mg elemental calcium Chew 1 tablet by mouth daily as needed for indigestion or heartburn.   pseudoephedrine-acetaminophen 30-500 MG Tabs tablet Commonly known as:  TYLENOL SINUS Take 1 tablet by mouth every 4 (four) hours as needed (for congestion).   THERAFLU FLU & SORE THROAT 20-10-650 MG Pack Generic drug:  Pheniramine-PE-APAP Take 1 Package by mouth as needed (for sinus).       No Known Allergies  Consultations: GI  Procedures/Studies:  No results found. EGD that showed gastritis with nonbleeding gastric ulcer   Subjective: She relates no new complaints feels great.  Discharge Exam: Vitals:   04/06/17 1500 04/06/17 1600  BP: 121/87 127/73  Pulse:  (!) 111  Resp: 12 17  Temp:    SpO2: 98% 99%   Vitals:   04/06/17 1300 04/06/17 1310 04/06/17 1500 04/06/17 1600  BP:  (!) 99/52 121/87 127/73  Pulse: (!) 101 (!) 105  (!) 111  Resp: 17 16 12 17   Temp:      TempSrc:      SpO2: 98% 98% 98% 99%  Weight:      Height:        General: Pt is alert, awake, not in acute distress Cardiovascular: RRR, S1/S2 +, no rubs, no gallops Respiratory: CTA bilaterally, no wheezing, no rhonchi Abdominal: Soft, NT, ND, bowel sounds + Extremities: no edema, no cyanosis    The results of significant diagnostics from this hospitalization (  including imaging, microbiology, ancillary and laboratory) are listed below for reference.     Microbiology: No results found for this or any previous visit (from the past 240 hour(s)).   Labs: BNP (last 3 results) No results for input(s): BNP in the last 8760 hours. Basic Metabolic Panel: Recent Labs  Lab 04/06/17 0408  NA 137  K 4.3  CL 109  CO2 21*  GLUCOSE 115*  BUN 29*  CREATININE 0.71  CALCIUM 7.9*   Liver Function Tests: Recent Labs  Lab 04/06/17 0408  AST 28  ALT 21  ALKPHOS 57  BILITOT 0.4  PROT 5.4*  ALBUMIN 3.2*   No results for  input(s): LIPASE, AMYLASE in the last 168 hours. No results for input(s): AMMONIA in the last 168 hours. CBC: Recent Labs  Lab 04/06/17 0408 04/06/17 0544 04/06/17 1038  WBC 10.7*  --  9.0  HGB 8.8* 8.6* 7.6*  HCT 26.6* 25.6* 23.0*  MCV 96.7  --  96.2  PLT 290  --  256   Cardiac Enzymes: No results for input(s): CKTOTAL, CKMB, CKMBINDEX, TROPONINI in the last 168 hours. BNP: Invalid input(s): POCBNP CBG: No results for input(s): GLUCAP in the last 168 hours. D-Dimer No results for input(s): DDIMER in the last 72 hours. Hgb A1c No results for input(s): HGBA1C in the last 72 hours. Lipid Profile No results for input(s): CHOL, HDL, LDLCALC, TRIG, CHOLHDL, LDLDIRECT in the last 72 hours. Thyroid function studies No results for input(s): TSH, T4TOTAL, T3FREE, THYROIDAB in the last 72 hours.  Invalid input(s): FREET3 Anemia work up No results for input(s): VITAMINB12, FOLATE, FERRITIN, TIBC, IRON, RETICCTPCT in the last 72 hours. Urinalysis No results found for: COLORURINE, APPEARANCEUR, LABSPEC, [place], GLUCOSEU, HGBUR, BILIRUBINUR, KETONESUR, PROTEINUR, UROBILINOGEN, NITRITE, LEUKOCYTESUR Sepsis Labs Invalid input(s): PROCALCITONIN,  WBC,  LACTICIDVEN Microbiology No results found for this or any previous visit (from the past 240 hour(s)).   Time coordinating discharge: Over 30 minutes  SIGNED:   Charlynne Cousins, MD  [facility] 04/06/2017, 4:14 PM Pager   If 7PM-7AM, please contact night-coverage [email or web address] Password [email or web address]

## 2017-04-06 NOTE — ED Notes (Signed)
All meds were held at 1408 due to patient being asleep and not wanting to be awakened.

## 2017-04-06 NOTE — ED Provider Notes (Signed)
TIME SEEN: 4:27 AM  CHIEF COMPLAINT: GI bleed  HPI: Patient is a 42 year old female with history of alcohol abuse who presents to the emergency department with GI bleed.  She states for the past 4 days she has had black stools.  Has been having some upper abdominal pain which she has been taking some Pepto-Bismol for intermittently.  No abdominal pain currently.  States tonight she started having coffee-ground emesis.  States she felt very lightheaded and had a syncopal event.  No chest pain or shortness of breath.  Does not take iron tablets she drinks 6-8 ounces of bourbon a night and then several hard sodas and sometimes [place].  She has been drinking heavily for years.  Has never had an upper GI bleed before.  Not on blood thinners.  No previous history of endoscopy.  Has never been told she has esophageal varices.  She does not have a GI physician.  Last seen by [facility] physicians in 2015.  ROS: See HPI Constitutional: no fever  Eyes: no drainage  ENT: no runny nose   Cardiovascular:  no chest pain  Resp: no SOB  GI:  vomiting GU: no dysuria Integumentary: no rash  Allergy: no hives  Musculoskeletal: no leg swelling  Neurological: no slurred speech ROS otherwise negative  PAST MEDICAL HISTORY/PAST SURGICAL HISTORY:  Past Medical History:  Diagnosis Date  . Medical history non-contributory     MEDICATIONS:  Prior to Admission medications   Medication Sig Start Date End Date Taking? Authorizing Provider  Prenatal Vit-Fe Fumarate-FA (PRENATAL MULTIVITAMIN) TABS tablet Take 1 tablet by mouth daily at 12 noon.    [provider]    ALLERGIES:  No Known Allergies  SOCIAL HISTORY:  Social History   Tobacco Use  . Smoking status: Current Some Day Smoker    Types: Cigarettes  Substance Use Topics  . Alcohol use: No    FAMILY HISTORY: Family History  Problem Relation Age of Onset  . Heart disease Father   . Heart disease Paternal Grandfather     EXAM: BP  115/87   Pulse (!) 107   SpO2 100%  CONSTITUTIONAL: Alert and oriented and responds appropriately to questions. Well-appearing; well-nourished, smiling, pleasant HEAD: Normocephalic EYES: Conjunctivae clear, pupils appear equal, EOMI ENT: normal nose; moist mucous membranes NECK: Supple, no meningismus, no nuchal rigidity, no LAD  CARD: Regular and tachycardic; S1 and S2 appreciated; no murmurs, no clicks, no rubs, no gallops RESP: Normal chest excursion without splinting or tachypnea; breath sounds clear and equal bilaterally; no wheezes, no rhonchi, no rales, no hypoxia or respiratory distress, speaking full sentences ABD/GI: Normal bowel sounds; non-distended; soft, non-tender, no rebound, no guarding, no peritoneal signs, no hepatosplenomegaly RECTAL:  Normal rectal tone, no gross blood, patient does have melena, guaiac positive, no hemorrhoids appreciated, nontender rectal exam, no fecal impaction BACK:  The back appears normal and is non-tender to palpation, there is no CVA tenderness EXT: Normal ROM in all joints; non-tender to palpation; no edema; normal capillary refill; no cyanosis, no calf tenderness or swelling    SKIN: Normal color for age and race; warm; no rash NEURO: Moves all extremities equally PSYCH: The patient's mood and manner are appropriate. Grooming and personal hygiene are appropriate.  MEDICAL DECISION MAKING: Patient here with upper GI bleed.  Blood pressure was 54/31 with EMS.  She is tachycardic here but normotensive.  Hemoglobin is 8.8.  She is still having coffee-ground emesis and melena.  Will give IV fluids and start  Protonix drip and give a bolus.  Patient will need admission.  Will monitor her closely.   Patient vomited 10 mL of coffee ground emesis before meds started.  ED PROGRESS: 5:30 AM Discussed patient's case with hospitalist, Dr. Hal Hope.  I have recommended admission and patient (and family if present) agree with this plan. Admitting physician  will place admission orders.   I reviewed all nursing notes, vitals, pertinent previous records, EKGs, lab and urine results, imaging (as available).       EKG Interpretation  Date/Time:  Monday April 06 2017 04:27:07 EST Ventricular Rate:  107 PR Interval:    QRS Duration: 91 QT Interval:  329 QTC Calculation: 439 R Axis:   65 Text Interpretation:  Sinus tachycardia Nonspecific T abnormalities, anterior leads No old tracing to compare Confirmed by Sequan Auxier, Cyril Mourning [phone]) on 04/06/2017 4:40:59 AM        CRITICAL CARE Performed by: Cyril Mourning Shawnae Leiva   Total critical care time: 45 minutes  Critical care time was exclusive of separately billable procedures and treating other patients.  Critical care was necessary to treat or prevent imminent or life-threatening deterioration.  Critical care was time spent personally by me on the following activities: development of treatment plan with patient and/or surrogate as well as nursing, discussions with consultants, evaluation of patient's response to treatment, examination of patient, obtaining history from patient or surrogate, ordering and performing treatments and interventions, ordering and review of laboratory studies, ordering and review of radiographic studies, pulse oximetry and re-evaluation of patient's condition.    Selby Slovacek, Delice Bison, DO 04/06/17 [phone]

## 2017-04-06 NOTE — Consult Note (Signed)
[facility] Consult: 8:15 AM 04/06/2017  LOS: 0 days    Referring Provider: Dr Olevia Bowens  Primary Care Physician:  Patient, No Pcp Per Primary Gastroenterologist:  unassigned     Reason for Consultation: Coffee ground emesis and anemia.     HPI: Hannah Bullock is a 42 y.o. female.  PMH cervical disc dz and stenosis.  S/p c spine and knee surgery.      For various aches and pains the patient's baseline use of meds is BC powders 4 times a week and ibuprofen 6 times a week.  For the last few days she upped BCs to once daily along with acetaminophen-containing cold remedies because she has upper respiratory symptoms.  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 Patient noticed soft, black colored stool starting on Thursday through Friday.  Last night, Sunday at 11 PM she developed small volume coffee-ground emesis and over the next few hours this became larger volume coffee-ground emesis but never hematemesis.  At home she had a syncopal spell.  EMS arrived and blood pressure was in the 50s/30s. By arrival at the ED BP 104/69, tachy to 122.   Hgb 8.8 >> 8.6, was 13.8 in 04/2013.  BUN 29.  Pt/INR ok.  FOB and GOB + No blood transfusions yet.    About 6 months ago patient had a similar spell of coffee grounds and dark stools but it lasted an hour or so.  She does not use PPI or H2 blockers.  Recently she has had a bit more heartburn than his normal.  No abdominal pain.  No chest pain.  No dysphagia. No family history of ulcer disease, GI bleed, anemia, liver disease or colon cancer.   Past Medical History:  Diagnosis Date  . Medical history non-contributory     Past Surgical History:  Procedure Laterality Date  . KNEE SURGERY    . NECK SURGERY       Prior to Admission medications   Medication Sig Start Date End Date Taking? Authorizing Provider  Aspirin-Salicylamide-Caffeine (BC HEADACHE POWDER PO) Take 1 packet by mouth as needed (for pain).   Yes [provider]  calcium carbonate (TUMS - DOSED IN MG ELEMENTAL CALCIUM) 500 MG chewable tablet Chew 1 tablet by mouth daily as needed for indigestion or heartburn.   Yes [provider]  DM-Doxylamine-Acetaminophen (NYQUIL HBP COLD & FLU) 15-6.25-325 MG/15ML LIQD Take 15 mLs by mouth as needed (for cough and sleep).   Yes [provider]  ibuprofen (ADVIL,MOTRIN) 200 MG tablet Take 200 mg by mouth every 6 (six) hours as needed for headache or mild pain.   Yes [provider]  Pheniramine-PE-APAP (THERAFLU FLU & SORE THROAT) 20-10-650 MG PACK Take 1 Package by mouth as needed (for sinus).   Yes [provider]  pseudoephedrine-acetaminophen (TYLENOL SINUS) 30-500 MG TABS tablet Take 1 tablet by mouth every 4 (four) hours as needed (for congestion).   Yes [provider]    Scheduled Meds: . folic  acid  1 mg Oral Daily  . LORazepam  0-4 mg Intravenous Q6H   Followed by  . [START ON 04/08/2017] LORazepam  0-4 mg Intravenous Q12H  . multivitamin with minerals  1 tablet Oral Daily  . [START ON 04/09/2017] pantoprazole  40 mg Intravenous Q12H  . thiamine  100 mg Oral Daily   Or  . thiamine  100 mg Intravenous Daily   Infusions: . sodium chloride 125 mL/hr at 04/06/17 0511  . sodium chloride    . pantoprozole (PROTONIX) infusion 8 mg/hr (04/06/17 0535)  . sodium chloride     PRN Meds: acetaminophen **OR** acetaminophen, LORazepam **OR** LORazepam, ondansetron **OR** ondansetron (ZOFRAN) IV   Allergies as of 04/06/2017  . (No Known Allergies)    Family History  Problem Relation Age of Onset  . Heart disease Father   . Heart disease Paternal Grandfather     Social History   Socioeconomic History  . Marital status: Married     Spouse name: Not on file  . Number of children: Not on file  . Years of education: Not on file  . Highest education level: Not on file  Social Needs  . Financial resource strain: Not on file  . Food insecurity - worry: Not on file  . Food insecurity - inability: Not on file  . Transportation needs - medical: Not on file  . Transportation needs - non-medical: Not on file  Occupational History  . Not on file  Tobacco Use  . Smoking status: Current Some Day Smoker    Types: Cigarettes  . Smokeless tobacco: Never Used  Substance and Sexual Activity  . Alcohol use: Yes    Comment: Every day.  . Drug use: No  . Sexual activity: Yes  Other Topics Concern  . Not on file  Social History Narrative  . Not on file    REVIEW OF SYSTEMS: Constitutional: Generally has good energy.  She works out vigorously at [employer] 3 times a week. ENT:  No nose bleeds Pulm: No shortness of breath or cough. CV:  No palpitations, no LE edema.  Chest pain GU:  No hematuria, no frequency GI:  Per HPI Heme: No unusual bleeding or bruising. Transfusions: No transfusions to date. Neuro: Syncope as per  HPI.  Generally has no headaches, no peripheral tingling or numbness Derm:  No itching, no rash or sores.  Endocrine:  No sweats or chills.  No polyuria or dysuria Immunization: Did not query her as to recent vaccinations. Travel:  None beyond local counties in last few months.    PHYSICAL EXAM: Vital signs in last 24 hours: Vitals:   04/06/17 0559 04/06/17 0600  BP:  138/83  Pulse:  (!) 122  Resp:  14  Temp: 98.5 F (36.9 C)   SpO2:  100%   Wt Readings from Last 3 Encounters:  05/01/13 65.3 kg (144 lb)    General: Pleasant, comfortable, fully alert, healthy-appearing WF Head: No facial asymmetry or swelling.  No signs of head trauma. Eyes: No scleral icterus.  No conjunctival pallor.  EOMI. Ears: Not hard of hearing Nose: No discharge or congestion. Mouth: Good dentition.  Tongue  midline.  Oral mucosa moist and clear. Neck: No JVD, no masses, no thyromegaly. Lungs: Good breath sounds, clear.  No dyspnea.  No cough Heart: RRR.  No MRG.  S1, S2 present. Abdomen: Soft.  Active bowel sounds.  No organomegaly, bruits, hernias..   Rectal: Deferred.  Stool FOBT positive per ED staff.  Musc/Skeltl: No joint redness, swelling or deformity. Extremities: No CCE.  Feet warm with brisk pedal pulses. Neurologic: Excellent historian.  Oriented x3.  No tremor.  Fully alert without somnolence. Skin: No rashes, sores, telemetry ectasia. Tattoos: On the back, professional quality. Nodes: No cervical or inguinal adenopathy. Psych: Pleasant, calm, cooperative.  Fluid speech.  Intake/Output from previous day: 02/24 0701 - 02/25 0700 In: 2000 [I.V.:1000; IV Piggyback:1000] Out: -  Intake/Output this shift: No intake/output data recorded.  LAB RESULTS: Recent Labs    04/06/17 0408 04/06/17 0544  WBC 10.7*  --   HGB 8.8* 8.6*  HCT 26.6* 25.6*  PLT 290  --    BMET Lab Results  Component Value Date   NA 137 04/06/2017   K 4.3 04/06/2017   CL 109 04/06/2017   CO2 21 (L) 04/06/2017   GLUCOSE 115 (H) 04/06/2017   BUN 29 (H) 04/06/2017   CREATININE 0.71 04/06/2017   CALCIUM 7.9 (L) 04/06/2017   LFT Recent Labs    04/06/17 0408  PROT 5.4*  ALBUMIN 3.2*  AST 28  ALT 21  ALKPHOS 57  BILITOT 0.4   PT/INR Lab Results  Component Value Date   INR 1.25 04/06/2017   Hepatitis Panel No results for input(s): HEPBSAG, HCVAB, HEPAIGM, HEPBIGM in the last 72 hours. C-Diff No components found for: CDIFF Lipase  No results found for: LIPASE  Drugs of Abuse  No results found for: LABOPIA, COCAINSCRNUR, LABBENZ, AMPHETMU, THCU, LABBARB   RADIOLOGY STUDIES: No results found.    IMPRESSION:   *  Hematemesis.  FOBT +.  Use of ASA powders, NSAIDs and heavy ETOH R/o ulcers, gastritis.   Protonix drip in place.    *  Blood loss anemia, recent unknown baseline.  Hgb  down 5 gm c/w 2 years ago.   Symptomatic with syncope, hypotension.  Blood pressure improved with IV fluids.  *Heavy alcohol use.  LFTs, coags are normal.    PLAN:     *   EGD later this morning.  Continue Protonix drip for the time being.   Azucena Freed  04/06/2017, 8:15 AM Pager: [phone]

## 2017-04-06 NOTE — Progress Notes (Signed)
[facility] PROGRESS NOTE    Progress Note  Hannah Bullock  DDU:[id number] DOB: May 24, 1975 DOA: 04/06/2017 PCP: Patient, No Pcp Per     Brief Narrative:   Hannah Bullock is an 42 y.o. female past medical history of alcohol, tobacco abuse N Goody powder use as well as ibuprofen for an upper respiratory tract infection comes into the ED with hematemesis,  Assessment/Plan:   Acute GI bleeding/Acute blood loss anemia: Baseline Hbg on 2015 was 16. On admission 8.6. With 6 episodes of hematemesis, will type and screen 2 units of packed red blood cells. Keep n.p.o.,  Have called GI for possible EGD this morning. She was started on Protonix drip, will give her an additional fluid bolus continue normal saline. Is currently asymptomatic.  Alcohol abuse Monitor with Ativan protocol started on thiamine and folate, she is high risk for withdrawal as she relates she consumes alcohol daily.  Tobacco abuse   DVT prophylaxis: SCD's Family Communication:none Disposition Plan/Barrier to D/C: unable to determine Code Status:     Code Status Orders  (From admission, onward)        Start     Ordered   04/06/17 0612  Full code  Continuous     04/06/17 0613    Code Status History    Date Active Date Inactive Code Status Order ID Comments User Context   This patient has a current code status but no historical code status.        IV Access:    Peripheral IV   Procedures and diagnostic studies:   No results found.   Medical Consultants:    None.  Anti-Infectives:    Subjective:    [name] no further episodes of hematemesis, she relates she is anorexic, she denies any abdominal pain.  Objective:    Vitals:   04/06/17 0500 04/06/17 0545 04/06/17 0559 04/06/17 0600  BP: 104/69 123/75  138/83  Pulse: 93 (!) 110  (!) 122  Resp: 16 18  14   Temp:   98.5 F (36.9 C)   TempSrc:   Oral   SpO2: 100% 100%  100%    Intake/Output Summary (Last 24 hours) at  04/06/2017 0739 Last data filed at 04/06/2017 2376 Gross per 24 hour  Intake 2000 ml  Output -  Net 2000 ml   There were no vitals filed for this visit.  Exam: General exam: In no acute distress. Respiratory system: Good air movement and clear to auscultation. Cardiovascular system: S1 & S2 heard, RRR.  Gastrointestinal system: Positive bowel sounds, soft nontender nondistended. Central nervous system: Alert and oriented. No focal neurological deficits. Extremities: No pedal edema. Skin: No rashes, lesions or ulcers Psychiatry: Judgement and insight appear normal. Mood & affect appropriate.    Data Reviewed:    Labs: Basic Metabolic Panel: Recent Labs  Lab 04/06/17 0408  NA 137  K 4.3  CL 109  CO2 21*  GLUCOSE 115*  BUN 29*  CREATININE 0.71  CALCIUM 7.9*   GFR CrCl cannot be calculated (Unknown ideal weight.). Liver Function Tests: Recent Labs  Lab 04/06/17 0408  AST 28  ALT 21  ALKPHOS 57  BILITOT 0.4  PROT 5.4*  ALBUMIN 3.2*   No results for input(s): LIPASE, AMYLASE in the last 168 hours. No results for input(s): AMMONIA in the last 168 hours. Coagulation profile Recent Labs  Lab 04/06/17 0454  INR 1.25    CBC: Recent Labs  Lab 04/06/17 0408 04/06/17 0544  WBC 10.7*  --  HGB 8.8* 8.6*  HCT 26.6* 25.6*  MCV 96.7  --   PLT 290  --    Cardiac Enzymes: No results for input(s): CKTOTAL, CKMB, CKMBINDEX, TROPONINI in the last 168 hours. BNP (last 3 results) No results for input(s): PROBNP in the last 8760 hours. CBG: No results for input(s): GLUCAP in the last 168 hours. D-Dimer: No results for input(s): DDIMER in the last 72 hours. Hgb A1c: No results for input(s): HGBA1C in the last 72 hours. Lipid Profile: No results for input(s): CHOL, HDL, LDLCALC, TRIG, CHOLHDL, LDLDIRECT in the last 72 hours. Thyroid function studies: No results for input(s): TSH, T4TOTAL, T3FREE, THYROIDAB in the last 72 hours.  Invalid input(s): FREET3 Anemia  work up: No results for input(s): VITAMINB12, FOLATE, FERRITIN, TIBC, IRON, RETICCTPCT in the last 72 hours. Sepsis Labs: Recent Labs  Lab 04/06/17 0408  WBC 10.7*   Microbiology No results found for this or any previous visit (from the past 240 hour(s)).   Medications:   . folic acid  1 mg Oral Daily  . LORazepam  0-4 mg Intravenous Q6H   Followed by  . [START ON 04/08/2017] LORazepam  0-4 mg Intravenous Q12H  . multivitamin with minerals  1 tablet Oral Daily  . [START ON 04/09/2017] pantoprazole  40 mg Intravenous Q12H  . thiamine  100 mg Oral Daily   Or  . thiamine  100 mg Intravenous Daily   Continuous Infusions: . sodium chloride 125 mL/hr at 04/06/17 0511  . sodium chloride    . pantoprozole (PROTONIX) infusion 8 mg/hr (04/06/17 0535)      LOS: 0 days   Charlynne Cousins  [facility] Pager [phone]  *Please refer to [email or web address], password [email or web address] to get updated schedule on who will round on this patient, as hospitalists switch teams weekly. If 7PM-7AM, please contact night-coverage at [email or web address], password [email or web address] for any overnight needs.  04/06/2017, 7:39 AM

## 2017-04-06 NOTE — ED Triage Notes (Signed)
Pt BIB [facility] for hematemesis. Around 2300 pt had a couple episodes of coffee ground emesis. 1-2 hours after that patient projectile vomited coffee ground emesis and passed out. Original bp was 54/31. A+O during triage pt has gotten 600cc of NS. Pt states she has had some dark stools and does take BC powders regularly.

## 2017-04-06 NOTE — ED Notes (Signed)
Mother[facility] number [phone] call if needed

## 2017-04-06 NOTE — Transfer of Care (Signed)
Immediate Anesthesia Transfer of Care Note  Patient: Hannah Bullock  Procedure(s) Performed: ESOPHAGOGASTRODUODENOSCOPY (EGD) (N/A )  Patient Location: PACU  Anesthesia Type:MAC  Level of Consciousness: awake, alert  and oriented  Airway & Oxygen Therapy: Patient Spontanous Breathing and Patient connected to nasal cannula oxygen  Post-op Assessment: Report given to RN, Post -op Vital signs reviewed and stable and Patient moving all extremities X 4  Post vital signs: Reviewed and stable  Last Vitals:  Vitals:   04/06/17 0915 04/06/17 1200  BP: 133/87 (!) 146/96  Pulse: (!) 125   Resp: 17 (!) 24  Temp:  37.7 C  SpO2: 100% 98%    Last Pain:  Vitals:   04/06/17 1200  TempSrc: Oral         Complications: No apparent anesthesia complications

## 2017-04-06 NOTE — Op Note (Signed)
[facility] Patient Name: Hannah Bullock Procedure Date : 04/06/2017 MRN: [id number] Attending MD: Jerene Bears , MD Date of Birth: May 08, 1975 CSN: [id number] Age: 42 Admit Type: Inpatient Procedure:                Upper GI endoscopy Indications:              Acute post hemorrhagic anemia, Coffee-ground                            emesis, Melena Providers:                Lajuan Lines. Hilarie Fredrickson, MD, Kingsley Plan, RN, Cherylynn Ridges, Technician, Edmonia James, CRNA Referring MD:             [facility] Medicines:                Monitored Anesthesia Care Complications:            No immediate complications. Estimated Blood Loss:     Estimated blood loss was minimal. Procedure:                Pre-Anesthesia Assessment:                           - Prior to the procedure, a History and Physical                            was performed, and patient medications and                            allergies were reviewed. The patient's tolerance of                            previous anesthesia was also reviewed. The risks                            and benefits of the procedure and the sedation                            options and risks were discussed with the patient.                            All questions were answered, and informed consent                            was obtained. Prior Anticoagulants: The patient has                            taken no previous anticoagulant or antiplatelet                            agents. ASA Grade Assessment: II - A patient with  mild systemic disease. After reviewing the risks                            and benefits, the patient was deemed in                            satisfactory condition to undergo the procedure.                           - Prior to the procedure, a History and Physical                            was performed, and patient medications and      allergies were reviewed. The patient's tolerance of                            previous anesthesia was also reviewed. The risks                            and benefits of the procedure and the sedation                            options and risks were discussed with the patient.                            All questions were answered, and informed consent                            was obtained. Prior Anticoagulants: The patient has                            taken no previous anticoagulant or antiplatelet                            agents. ASA Grade Assessment: II - A patient with                            mild systemic disease. After reviewing the risks                            and benefits, the patient was deemed in                            satisfactory condition to undergo the procedure.                           After obtaining informed consent, the endoscope was                            passed under direct vision. Throughout the                            procedure, the patient's blood pressure, pulse, and  oxygen saturations were monitored continuously. The                            Endoscope was introduced through the mouth, and                            advanced to the second part of duodenum. The upper                            GI endoscopy was accomplished without difficulty.                            The patient tolerated the procedure well. Scope In: Scope Out: Findings:      LA Grade A (one or more mucosal breaks less than 5 mm, not extending       between tops of 2 mucosal folds) esophagitis with no bleeding was found       at the gastroesophageal junction.      The cardia and gastric fundus were normal on retroflexion.      Localized moderate inflammation was found in the gastric antrum.       Biopsies were taken with a cold forceps for Helicobacter pylori testing.      One non-bleeding cratered gastric ulcer with no stigmata of  bleeding was       found in the gastric antrum. The lesion was 6 mm in largest dimension.      The examined duodenum was normal. Impression:               - Very mild reflux esophagitis.                           - Gastritis. Biopsied.                           - Non-bleeding gastric ulcer with no stigmata of                            bleeding (source for recent coffee-ground emesis,                            melena and acute anemia).                           - Normal examined duodenum. Moderate Sedation:      N/A Recommendation:           - Return patient to hospital ward for ongoing care.                           - Advance diet as tolerated.                           - No aspirin, ibuprofen, naproxen, or other                            non-steroidal anti-inflammatory drugs.                           -  Await pathology results.                           - Twice daily PPI for at least 8 weeks and then                            daily if need for NSAIDs.                           - Avoid alcohol for now given gastritis and ulcer                            disease.                           - Ulcer is low-risk for rebleeding given clean                            base. Call GI with questions. Procedure Code(s):        --- Professional ---                           [phone], Esophagogastroduodenoscopy, flexible,                            transoral; with biopsy, single or multiple Diagnosis Code(s):        --- Professional ---                           K21.0, Gastro-esophageal reflux disease with                            esophagitis                           K25.9, Gastric ulcer, unspecified as acute or                            chronic, without hemorrhage or perforation                           K29.70, Gastritis, unspecified, without bleeding                           D62, Acute posthemorrhagic anemia                           K92.0, Hematemesis                           K92.1,  Melena (includes Hematochezia) CPT copyright 2016 American Medical Association. All rights reserved. The codes documented in this report are preliminary and upon coder review may  be revised to meet current compliance requirements. Jerene Bears, MD 04/06/2017 12:56:03 PM This report has been signed electronically. Number of Addenda: 0

## 2017-04-06 NOTE — ED Notes (Signed)
Spoke to hospitalist. He will be prescribing meds electronically.  Patient has been made aware to pick up meds at [facility] in [place].

## 2017-04-06 NOTE — Anesthesia Postprocedure Evaluation (Signed)
Anesthesia Post Note  Patient: Hannah Bullock  Procedure(s) Performed: ESOPHAGOGASTRODUODENOSCOPY (EGD) (N/A )     Patient location during evaluation: PACU Anesthesia Type: MAC Level of consciousness: awake and alert Pain management: pain level controlled Vital Signs Assessment: post-procedure vital signs reviewed and stable Respiratory status: spontaneous breathing, nonlabored ventilation and respiratory function stable Cardiovascular status: stable and blood pressure returned to baseline Postop Assessment: no apparent nausea or vomiting Anesthetic complications: no    Last Vitals:  Vitals:   04/06/17 1300 04/06/17 1310  BP:  (!) 99/52  Pulse: (!) 101 (!) 105  Resp: 17 16  Temp:    SpO2: 98% 98%    Last Pain:  Vitals:   04/06/17 1245  TempSrc: Oral                 Zeola Brys,W. EDMOND

## 2017-04-06 NOTE — ED Notes (Signed)
Soft diet lunch tray ordered

## 2017-04-07 LAB — HIV ANTIBODY (ROUTINE TESTING W REFLEX): HIV SCREEN 4TH GENERATION: NONREACTIVE

## 2017-04-09 ENCOUNTER — Encounter (HOSPITAL_COMMUNITY): Payer: Self-pay | Admitting: Internal Medicine

## 2019-04-18 ENCOUNTER — Ambulatory Visit: Payer: Self-pay | Attending: Internal Medicine

## 2019-04-18 DIAGNOSIS — Z23 Encounter for immunization: Secondary | ICD-10-CM | POA: Insufficient documentation

## 2019-04-18 NOTE — Progress Notes (Signed)
   Covid-19 Vaccination Clinic  Name:  Hannah Bullock    MRN: [id number] DOB: November 05, 1975  04/18/2019  [name] was observed post Covid-19 immunization for 15 minutes without incident. She was provided with Vaccine Information Sheet and instruction to access the V-Safe system.   [name] was instructed to call 911 with any severe reactions post vaccine: Marland Kitchen Difficulty breathing  . Swelling of face and throat  . A fast heartbeat  . A bad rash all over body  . Dizziness and weakness   Immunizations Administered    Name Date Dose VIS Date Route   Pfizer COVID-19 Vaccine 04/18/2019  5:55 PM 0.3 mL 01/21/2019 Intramuscular   Manufacturer: [place]   Lot: [id number]   [place]: [id number]

## 2019-05-18 ENCOUNTER — Ambulatory Visit: Payer: Self-pay | Attending: Internal Medicine

## 2019-05-18 DIAGNOSIS — Z23 Encounter for immunization: Secondary | ICD-10-CM

## 2019-05-18 NOTE — Progress Notes (Signed)
   Covid-19 Vaccination Clinic  Name:  Hannah Bullock    MRN: [id number] DOB: 11-02-1975  05/18/2019  [name] was observed post Covid-19 immunization for 15 minutes without incident. She was provided with Vaccine Information Sheet and instruction to access the V-Safe system.   [name] was instructed to call 911 with any severe reactions post vaccine: Marland Kitchen Difficulty breathing  . Swelling of face and throat  . A fast heartbeat  . A bad rash all over body  . Dizziness and weakness   Immunizations Administered    Name Date Dose VIS Date Route   Pfizer COVID-19 Vaccine 05/18/2019  3:09 PM 0.3 mL 01/21/2019 Intramuscular   Manufacturer: [place]   Lot: [id number]   [place]: [id number]

## 2021-10-21 DIAGNOSIS — Z6827 Body mass index (BMI) 27.0-27.9, adult: Secondary | ICD-10-CM | POA: Diagnosis not present

## 2021-10-21 DIAGNOSIS — E559 Vitamin D deficiency, unspecified: Secondary | ICD-10-CM | POA: Diagnosis not present

## 2021-12-02 DIAGNOSIS — Z6826 Body mass index (BMI) 26.0-26.9, adult: Secondary | ICD-10-CM | POA: Diagnosis not present

## 2021-12-02 DIAGNOSIS — E782 Mixed hyperlipidemia: Secondary | ICD-10-CM | POA: Diagnosis not present

## 2021-12-03 ENCOUNTER — Other Ambulatory Visit: Payer: Self-pay

## 2021-12-03 ENCOUNTER — Emergency Department (HOSPITAL_COMMUNITY): Payer: 59

## 2021-12-03 ENCOUNTER — Ambulatory Visit (HOSPITAL_COMMUNITY): Payer: 59

## 2021-12-03 ENCOUNTER — Encounter (HOSPITAL_COMMUNITY): Payer: Self-pay

## 2021-12-03 ENCOUNTER — Encounter (HOSPITAL_COMMUNITY): Payer: Self-pay | Admitting: *Deleted

## 2021-12-03 ENCOUNTER — Emergency Department (HOSPITAL_COMMUNITY)
Admission: EM | Admit: 2021-12-03 | Discharge: 2021-12-04 | Disposition: A | Discharge status: Home / Self Care | Payer: 59 | Attending: Emergency Medicine | Admitting: Emergency Medicine

## 2021-12-03 ENCOUNTER — Ambulatory Visit (HOSPITAL_COMMUNITY)
Admission: EM | Admit: 2021-12-03 | Discharge: 2021-12-03 | Disposition: A | Discharge status: Home / Self Care | Payer: 59

## 2021-12-03 DIAGNOSIS — R030 Elevated blood-pressure reading, without diagnosis of hypertension: Secondary | ICD-10-CM | POA: Insufficient documentation

## 2021-12-03 DIAGNOSIS — L03113 Cellulitis of right upper limb: Secondary | ICD-10-CM | POA: Diagnosis not present

## 2021-12-03 DIAGNOSIS — I10 Essential (primary) hypertension: Secondary | ICD-10-CM | POA: Diagnosis not present

## 2021-12-03 DIAGNOSIS — M79601 Pain in right arm: Secondary | ICD-10-CM | POA: Diagnosis not present

## 2021-12-03 DIAGNOSIS — M7989 Other specified soft tissue disorders: Secondary | ICD-10-CM | POA: Diagnosis not present

## 2021-12-03 DIAGNOSIS — R21 Rash and other nonspecific skin eruption: Secondary | ICD-10-CM | POA: Diagnosis not present

## 2021-12-03 LAB — BASIC METABOLIC PANEL
Anion gap: 13 (ref 5–15)
BUN: 7 mg/dL (ref 6–20)
CO2: 21 mmol/L — ABNORMAL LOW (ref 22–32)
Calcium: 9.6 mg/dL (ref 8.9–10.3)
Chloride: 98 mmol/L (ref 98–111)
Creatinine, Ser: 0.8 mg/dL (ref 0.44–1.00)
GFR, Estimated: >60 mL/min (ref >60)
Glucose, Bld: 162 mg/dL — ABNORMAL HIGH (ref 70–99)
Potassium: 2.9 mmol/L — ABNORMAL LOW (ref 3.5–5.1)
Sodium: 132 mmol/L — ABNORMAL LOW (ref 135–145)

## 2021-12-03 LAB — CBC WITH DIFFERENTIAL/PLATELET
Abs Immature Granulocytes: 0.06 10*3/uL (ref 0.00–0.07)
Basophils Absolute: 0 10*3/uL (ref 0.0–0.1)
Basophils Relative: 0 %
Eosinophils Absolute: 0 10*3/uL (ref 0.0–0.5)
Eosinophils Relative: 0 %
HCT: 39.7 % (ref 36.0–46.0)
Hemoglobin: 14.4 g/dL (ref 12.0–15.0)
Immature Granulocytes: 0 %
Lymphocytes Relative: 9 %
Lymphs Abs: 1.2 10*3/uL (ref 0.7–4.0)
MCH: 34.9 pg — ABNORMAL HIGH (ref 26.0–34.0)
MCHC: 36.3 g/dL — ABNORMAL HIGH (ref 30.0–36.0)
MCV: 96.1 fL (ref 80.0–100.0)
Monocytes Absolute: 0.9 10*3/uL (ref 0.1–1.0)
Monocytes Relative: 6 %
Neutro Abs: 11.3 10*3/uL — ABNORMAL HIGH (ref 1.7–7.7)
Neutrophils Relative %: 85 %
Platelets: 305 10*3/uL (ref 150–400)
RBC: 4.13 MIL/uL (ref 3.87–5.11)
RDW: 12 % (ref 11.5–15.5)
WBC: 13.5 10*3/uL — ABNORMAL HIGH (ref 4.0–10.5)
nRBC: 0 % (ref 0.0–0.2)

## 2021-12-03 LAB — LACTIC ACID, PLASMA: Lactic Acid, Venous: 1.3 mmol/L (ref 0.5–1.9)

## 2021-12-03 NOTE — ED Provider Triage Note (Signed)
Emergency Medicine Provider Triage Evaluation Note  Hannah Bullock , a 46 y.o. female  was evaluated in triage.  Patient presenting today with right upper extremity cellulitis.  Reports for the past couple days she had pain in her right elbow but now is having streaking redness up the arm.  Denies any IVDU.  She was sent from urgent care due to tachycardia and borderline fever.  Review of Systems  Positive: Fevers, circa 100 Negative:   Physical Exam  BP (!) 149/104 (BP Location: Left Arm)   Pulse (!) 119   Temp 99.9 F (37.7 C) (Oral)   Resp 16   Ht 5' 4.5" (1.638 m)   Wt 71.7 kg   LMP 11/09/2021 (Approximate)   SpO2 96%   BMI 26.70 kg/m  Gen:   Awake, no distress   Resp:  Normal effort  MSK:   Moves extremities without difficulty  Other:  Elbow inflammation and cellulitis streaking up the upper extremity  Medical Decision Making  Medically screening exam initiated at 9:22 PM.  Appropriate orders placed.  Analissa Bayless was informed that the remainder of the evaluation will be completed by another provider, this initial triage assessment does not replace that evaluation, and the importance of remaining in the ED until their evaluation is complete.     Rhae Hammock, PA-C 12/03/21 2124

## 2021-12-03 NOTE — Discharge Instructions (Signed)
Please go to emergency department for further care and management.

## 2021-12-03 NOTE — ED Triage Notes (Signed)
Pt presents with swelling and redness to right arm that she noticed 2 days ago. The redness extends to her axilla down to her wrist. She was recently seen for this and put on Azithromycin and prednisone. She reports the redness has worsened today.   She also reports cough x 10 days

## 2021-12-03 NOTE — ED Triage Notes (Signed)
Pt states she has had a cough x 10 days which was getting worse she did a telehealth appt and was given prednisone and an zpak on Saturday. 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 Today she noticed she has a rash on her whole right arm. She is having whole body pains, side pains from coughing so much. She has been taking the meds given on Saturday (held them today), IBU, and mucinex fast med.

## 2021-12-03 NOTE — ED Provider Notes (Signed)
Provider Note  Patient Contact: 6:43 PM (approximate)   History   multiple complaints   HPI  Hannah Bullock is a 46 y.o. female presents to the urgent care with multiple medical complaints.  Patient is primarily concerned with new onset of fever as well as erythema from right elbow with streaking along the right upper extremity.  Patient states that.  Patient also states that she has been taking for cough but has taken Z-Pak in the past and has tolerated medication well.  Patient states that she has been coughing for the past 10 days has some abdominal discomfort and body aches.  She endorses generalized fatigue at home      Physical Exam   Triage Vital Signs: ED Triage Vitals  Enc Vitals Group     BP 12/03/21 1824 (!) 161/109     Pulse Rate 12/03/21 1824 (!) 130     Resp 12/03/21 1824 18     Temp 12/03/21 1824 100.2 F (37.9 C)     Temp Source 12/03/21 1824 Oral     SpO2 12/03/21 1824 97 %     Weight --      Height --      Head Circumference --      Peak Flow --      Pain Score 12/03/21 1825 7     Pain Loc --      Pain Edu? --      Excl. in [place]? --     Most recent vital signs: Vitals:   12/03/21 1824  BP: (!) 161/109  Pulse: (!) 130  Resp: 18  Temp: 100.2 F (37.9 C)  SpO2: 97%     General: Alert and in no acute distress. Eyes:  PERRL. EOMI. Head: No acute traumatic findings ENT:      Nose: No congestion/rhinnorhea.      Mouth/Throat: Mucous membranes are moist. Neck: No stridor. No cervical spine tenderness to palpation. Cardiovascular:  Good peripheral perfusion Respiratory: Normal respiratory effort without tachypnea or retractions. Lungs CTAB. Good air entry to the bases with no decreased or absent breath sounds. Gastrointestinal: Bowel sounds 4 quadrants. Soft and nontender to palpation. No guarding or rigidity. No palpable masses. No distention. No CVA tenderness. Musculoskeletal: Full range of motion to all extremities.  Patient performs  limited range of motion of the right elbow Neurologic:  No gross focal neurologic deficits are appreciated.  Skin: Patient has erythema right upper extremity extending from right elbow to upper arm with streaking Other:   ED Results / Procedures / Treatments   Labs (all labs ordered are listed, but only abnormal results are displayed) Labs Reviewed - No data to display      PROCEDURES:  Critical Care performed: No  Procedures   MEDICATIONS ORDERED IN ED: Medications - No data to display   IMPRESSION / MDM / [place] / ED COURSE  I reviewed the triage vital signs and the nursing notes.                              Assessment and plan Cellulitis 46 year old female presents to the urgent care with [place] erythema of the right upper extremity that has progressed in intensity rapidly over the past 2 to 3 days with streaking and limited range of motion at the right elbow.  Patient was hypertensive but tachycardic with heart rate at 130 and a low-grade fever.  Differential diagnosis and urticaria.Marland KitchenMarland Kitchen  Given that rash only affects right upper extremity with edema to right elbow reaction/urticaria.  I am also concerned for sepsis given tachycardia and low-grade fever noted in triage Patient was referred to the emergency department for further care and management given concern for sepsis.      FINAL CLINICAL IMPRESSION(S) / ED DIAGNOSES   Final diagnoses:  Cellulitis of right upper extremity     Rx / DC Orders   ED Discharge Orders     None        Note:  This document was prepared using Dragon voice recognition software and may include unintentional dictation errors.   Vallarie Mare [place], [place] 12/03/21 [phone]

## 2021-12-03 NOTE — ED Notes (Signed)
Patient is being discharged from the Urgent Care and sent to the Emergency Department via personal vehicle. Per Vallarie Mare PA, patient is in need of higher level of care due to possible cellulitis. Patient is aware and verbalizes understanding of plan of care.  Vitals:   12/03/21 1824  BP: (!) 161/109  Pulse: (!) 130  Resp: 18  Temp: 100.2 F (37.9 C)  SpO2: 97%

## 2021-12-04 ENCOUNTER — Other Ambulatory Visit: Payer: Self-pay

## 2021-12-04 ENCOUNTER — Emergency Department (EMERGENCY_DEPARTMENT_HOSPITAL): Payer: 59

## 2021-12-04 DIAGNOSIS — M79601 Pain in right arm: Secondary | ICD-10-CM | POA: Diagnosis not present

## 2021-12-04 DIAGNOSIS — R21 Rash and other nonspecific skin eruption: Secondary | ICD-10-CM

## 2021-12-04 DIAGNOSIS — M7989 Other specified soft tissue disorders: Secondary | ICD-10-CM

## 2021-12-04 LAB — MAGNESIUM: Magnesium: 2 mg/dL (ref 1.7–2.4)

## 2021-12-04 MED ORDER — SODIUM CHLORIDE 0.9 % IV BOLUS
1000.0000 mL | Freq: Once | INTRAVENOUS | Status: AC
  Administered 2021-12-04: 1000 mL via INTRAVENOUS

## 2021-12-04 MED ORDER — CEPHALEXIN 500 MG PO CAPS: 500.0000 mg | ORAL_CAPSULE | Freq: Four times a day (QID) | ORAL | 0 refills | Status: AC

## 2021-12-04 MED ORDER — CEFTRIAXONE SODIUM 2 G IJ SOLR
2.0000 g | Freq: Once | INTRAMUSCULAR | Status: AC
  Administered 2021-12-04: 2 g via INTRAVENOUS
  Filled 2021-12-04: qty 20

## 2021-12-04 MED ORDER — POTASSIUM CHLORIDE CRYS ER 20 MEQ PO TBCR: 20.0000 meq | EXTENDED_RELEASE_TABLET | Freq: Every day | ORAL | 0 refills | Status: DC

## 2021-12-04 MED ORDER — POTASSIUM CHLORIDE CRYS ER 20 MEQ PO TBCR
40.0000 meq | EXTENDED_RELEASE_TABLET | Freq: Once | ORAL | Status: AC
  Administered 2021-12-04: 40 meq via ORAL
  Filled 2021-12-04: qty 2

## 2021-12-04 NOTE — ED Provider Notes (Cosign Needed Addendum)
[facility] Provider Note   CSN: [id number] Arrival date & time: 12/03/21  1903     History  Chief Complaint  Patient presents with   Cellulitis    Hannah Bullock is a 46 y.o. female who reports her self is otherwise healthy no daily medication use presented for evaluation of pain and erythema to the right arm that has been ongoing x2 days.  She denies similar in the past.  She reports that she did have a small pustule to her armpit that has been healing.  She denies any injury or cuts to the right arm.  Patient does report that she has been experiencing some bronchitis for the last 2 weeks and has been on prednisone and azithromycin for the past 4-5 days with some improvement.  She does report intermittent fevers at home up to 100.8 F.  Patient denies nausea, vomiting, diarrhea, abdominal pain, shortness of breath, fall/injury, numbness, tingling, IV drug use or any additional concerns.  HPI     Home Medications Prior to Admission medications   Medication Sig Start Date End Date Taking? Authorizing Provider  azithromycin (ZITHROMAX) 250 MG tablet Take 250-500 mg by mouth See admin instructions. 500 mg on day 1, 250 mg once daily on days 2-5. 11/30/21  Yes [provider]  cephALEXin (KEFLEX) 500 MG capsule Take 1 capsule (500 mg total) by mouth 4 (four) times daily for 10 days. 12/04/21 12/14/21 Yes Nuala Alpha A, PA-C  Cholecalciferol (VITAMIN D-3 PO) Take 1 capsule by mouth daily.   Yes [provider]  ibuprofen (ADVIL) 200 MG tablet Take 800 mg by mouth 3 (three) times daily as needed for fever, headache or mild pain.   Yes [provider]  loratadine (CLARITIN) 10 MG tablet Take 10 mg by mouth daily.   Yes [provider]  phentermine 15 MG capsule Take 15 mg by mouth daily. 10/26/21  Yes [provider]  Phenylephrine-APAP-guaiFENesin (MUCINEX FAST-MAX PO) Take 20 mLs by mouth every 6 (six)  hours as needed (fever, headache, flu-like symptoms).   Yes [provider]  potassium chloride SA (KLOR-CON M) 20 MEQ tablet Take 1 tablet (20 mEq total) by mouth daily for 3 days. 12/04/21 12/07/21 Yes Nuala Alpha A, PA-C  predniSONE (DELTASONE) 20 MG tablet Take 20 mg by mouth daily. 10 day course. Pt on day 3. 11/30/21  Yes [provider]      Allergies    Patient has no known allergies.    Review of Systems   Review of Systems Ten systems are reviewed and are negative for acute change except as noted in the HPI  Physical Exam Updated Vital Signs BP (!) 162/101   Pulse 93   Temp 98.6 F (37 C) (Oral)   Resp 19   Ht 5' 4.5" (1.638 m)   Wt 71.7 kg   LMP 11/09/2021 (Approximate)   SpO2 98%   BMI 26.70 kg/m  Physical Exam Constitutional:      General: She is not in acute distress.    Appearance: Normal appearance. She is well-developed. She is not ill-appearing or diaphoretic.  HENT:     Head: Normocephalic and atraumatic.  Eyes:     General: Vision grossly intact. Gaze aligned appropriately.     Pupils: Pupils are equal, round, and reactive to light.  Neck:     Trachea: Trachea and phonation normal.  Cardiovascular:     Rate and Rhythm: Normal rate and regular rhythm.  Pulmonary:     Effort: Pulmonary effort is normal. No respiratory distress.  Abdominal:     General: There is no distension.     Palpations: Abdomen is soft.     Tenderness: There is no abdominal tenderness. There is no guarding or rebound.  Musculoskeletal:        General: Normal range of motion.     Cervical back: Normal range of motion.     Comments: Diffuse erythema to the posterior right upper arm extending down to the forearm.  Please see picture attached.  There is no induration or skin break.  Patient has full range of motion with all movements of the finger hand wrist elbow and shoulder with mild "tight feeling" to the upper arm with elbow flexion.  No bony tenderness on  examination.  Strong radial pulse.  Compartments soft.  Capillary refill and sensation intact.  Skin:    General: Skin is warm and dry.     Findings: Erythema present.  Neurological:     Mental Status: She is alert.     GCS: GCS eye subscore is 4. GCS verbal subscore is 5. GCS motor subscore is 6.     Comments: Speech is clear and goal oriented, follows commands Major Cranial nerves without deficit, no facial droop Moves extremities without ataxia, coordination intact  Psychiatric:        Behavior: Behavior normal.          ED Results / Procedures / Treatments   Labs (all labs ordered are listed, but only abnormal results are displayed) Labs Reviewed  CBC WITH DIFFERENTIAL/PLATELET - Abnormal; Notable for the following components:      Result Value   WBC 13.5 (*)    MCH 34.9 (*)    MCHC 36.3 (*)    Neutro Abs 11.3 (*)    All other components within normal limits  BASIC METABOLIC PANEL - Abnormal; Notable for the following components:   Sodium 132 (*)    Potassium 2.9 (*)    CO2 21 (*)    Glucose, Bld 162 (*)    All other components within normal limits  CULTURE, BLOOD (ROUTINE X 2)  CULTURE, BLOOD (ROUTINE X 2)  LACTIC ACID, PLASMA  MAGNESIUM  LACTIC ACID, PLASMA  I-STAT BETA HCG BLOOD, ED ([facility], [facility], [facility] ONLY)    EKG None  Radiology UE VENOUS DUPLEX (7am - 7pm)  Result Date: 12/04/2021 UPPER VENOUS STUDY  Patient Name:  Hannah Bullock  Date of Exam:   12/04/2021 Medical Rec #: [id number]      Accession #:    [id number] Date of Birth: 07-25-1975       Patient Gender: F Patient Age:   79 years Exam Location:  [facility] Procedure:      VAS [place] UPPER EXTREMITY VENOUS DUPLEX Referring Phys: Erlene Quan Chantz [place] --------------------------------------------------------------------------------  Indications: Swelling, Pain, and redness and a rash on the upper right arm Comparison Study: No priors. Performing Technologist: Oda Cogan RDMS, RVT  Examination Guidelines: A  complete evaluation includes B-mode imaging, spectral Doppler, color Doppler, and power Doppler as needed of all accessible portions of each vessel. Bilateral testing is considered an integral part of a complete examination. Limited examinations for reoccurring indications may be performed as noted.  Right Findings: +----------+------------+---------+-----------+----------+-------+ RIGHT     CompressiblePhasicitySpontaneousPropertiesSummary +----------+------------+---------+-----------+----------+-------+ IJV           Full       Yes       Yes                      +----------+------------+---------+-----------+----------+-------+  Subclavian    Full       Yes       Yes                      +----------+------------+---------+-----------+----------+-------+ Axillary      Full       Yes       Yes                      +----------+------------+---------+-----------+----------+-------+ Brachial      Full                                          +----------+------------+---------+-----------+----------+-------+ Radial        Full                                          +----------+------------+---------+-----------+----------+-------+ Ulnar         Full                                          +----------+------------+---------+-----------+----------+-------+ Cephalic      Full                                          +----------+------------+---------+-----------+----------+-------+ Basilic       Full                                          +----------+------------+---------+-----------+----------+-------+  Left Findings: +----------+------------+---------+-----------+----------+-------+ LEFT      CompressiblePhasicitySpontaneousPropertiesSummary +----------+------------+---------+-----------+----------+-------+ Subclavian               Yes       Yes                      +----------+------------+---------+-----------+----------+-------+   Summary:  Right: No evidence of deep vein thrombosis in the upper extremity. No evidence of superficial vein thrombosis in the upper extremity.  Left: No evidence of thrombosis in the subclavian.  *See table(s) above for measurements and observations.    Preliminary    DG Elbow Complete Right  Result Date: 12/03/2021 CLINICAL DATA:  Cellulitis, swelling and red rash. EXAM: RIGHT ELBOW - COMPLETE 3+ VIEW COMPARISON:  None Available. FINDINGS: There is no evidence of fracture, dislocation, or joint effusion. There is no evidence of arthropathy or other focal bone abnormality. Soft tissue swelling is present along the posterior aspect of the distal humerus, elbow, and proximal forearm. No subcutaneous emphysema. IMPRESSION: Soft tissue swelling along the posterior aspect of the distal humerus, elbow, and proximal forearm, compatible with history of cellulitis. No acute osseous abnormality. Electronically Signed   By: Brett Fairy M.D.   On: 12/03/2021 21:41    Procedures Procedures    Medications Ordered in ED Medications  sodium chloride 0.9 % bolus 1,000 mL (0 mLs Intravenous Stopped 12/04/21 1158)  cefTRIAXone (ROCEPHIN) 2 g in sodium chloride 0.9 % 100 mL IVPB (0 g Intravenous Stopped 12/04/21 1157)  potassium chloride SA (KLOR-CON  M) CR tablet 40 mEq (40 mEq Oral Given 12/04/21 0933)    ED Course/ Medical Decision Making/ A&P Clinical Course as of 12/04/21 1209  Wed Dec 04, 2021  1201 I-Stat Beta hCG blood, ED ([facility], [facility], [facility] only) Patient denies chance for pregnancy today and does not want pregnancy testing. [BM]    Clinical Course User Index [BM] Deliah Boston, PA-C                           Medical Decision Making 46 year old otherwise healthy female presented for erythema and pain of the posterior right arm has been ongoing x2 days.  No clear inciting event she has been dealing with bronchitis for the past 2 weeks and has been on prednisone and azithromycin for the past 4-5 days.   She denies any difficulty breathing, sore throat, chest tightness, nausea, vomiting, diarrhea, abdominal pain or other symptoms to suggest allergic reaction.  She has no [place] area.  She has diffuse erythema to the posterior aspect of the right arm, there is a small healing pustule to the armpit which is not confluent with her area of erythema.  Otherwise examination within normal limits.  She is neurovascular intact.  Good range of motion and strength on exam.  Suspect patient has cellulitis as cause of her symptoms at this time.  Low suspicion for septic joint, DVT, allergic reaction or other emergent pathologies at this time.  Labs obtained in triage included CBC, BMP, blood cultures and lactic.  X-ray of the right elbow was obtained in triage as well.  Amount and/or Complexity of Data Reviewed Independent Historian: spouse    Details: Patient's husband at bedside Labs: ordered.    Details: CBC shows mild leukocytosis of 13.5.  No anemia or thrombocytopenia. BMP shows hypokalemia of 2.9.  This was repleted with K-Dur.  No emergent electrolyte derangement or gap. Magnesium within normal limits. Lactic acid within normal limits, reassuring Blood cultures pending Radiology:     Details: I have personally reviewed and interpreted patient's 4-view x-ray of her right elbow.  I do not appreciate any obvious acute fracture, dislocation or bony lesions.  Risk Prescription drug management. Risk Details: Doppler of the right upper extremity was obtained and is negative for DVT or SVT.  I had a long conversation regarding treatment options with the patient today, we discussed outpatient treatment versus admission to the hospital for IV antibiotics.  I offered to consult with hospitalist for possible admission given the extent of her cellulitis, patient declined she wants to try outpatient treatment at this time, feel this is reasonable given patient is otherwise healthy, she is well-appearing in no acute  distress.  She was tachycardic on arrival this has resolved, she is afebrile at this time as well.  She does have a slight leukocytosis.  Do not feel patient is septic at this time so code sepsis was not initiated.  Patient was given 1 dose of IV Rocephin along with IV fluid in the ER and she will be treated with Keflex 500 mg 4 times daily for treatment of her cellulitis.  I advised patient have this area rechecked in the next 24-48 hours to ensure improvement, her skin was marked, she is aware that if her symptoms worsen or if erythema extends past the mark she should return immediately to the ER for further treatment.  She is aware that she may return at any time for further evaluation.  Patient is  in agreement with plan she reports no social limitations to ER return, she lives with her husband.  Patient seen and evaluated by Dr. Alvino Chapel during this visit who agrees with plan to discharge at this time.  Incidentally patient's blood pressure elevated in the ER today.  She is made aware of this and encouraged to have blood pressure rechecked by PCP.  Patient without signs/symptoms to suggest hypertensive urgency/emergency today, she is to return if symptoms occur.  She was also made aware that her potassium level was low, and she was repleted in the ER today and has been sent a 3-day prescription for K-Dur.  I asked patient to have her potassium level rechecked in the next week.     At this time there does not appear to be any evidence of an acute emergency medical condition and the patient appears stable for discharge with appropriate outpatient follow up. Diagnosis was discussed with patient who verbalizes understanding of care plan and is agreeable to discharge. I have discussed return precautions with patient who verbalizes understanding. Patient encouraged to follow-up with their PCP. All questions answered.   Note: Portions of this report may have been transcribed using voice recognition  software. Every effort was made to ensure accuracy; however, inadvertent computerized transcription errors may still be present.  Final Clinical Impression(s) / ED Diagnoses Final diagnoses:  Cellulitis of right arm  Elevated blood pressure reading    Rx / DC Orders ED Discharge Orders          Ordered    cephALEXin (KEFLEX) 500 MG capsule  4 times daily        12/04/21 1157    potassium chloride SA (KLOR-CON M) 20 MEQ tablet  Daily        12/04/21 1159               Gari Crown 12/04/21 1209    Davonna Belling, MD 12/05/21 [phone]

## 2021-12-04 NOTE — ED Notes (Signed)
Patient complaining of worsening rash on r arm. Triage rn melanie notified

## 2021-12-04 NOTE — Discharge Instructions (Addendum)
At this time there does not appear to be the presence of an emergent medical condition, however there is always the potential for conditions to change. Please read and follow the below instructions.  Please return to the Emergency Department immediately for any new or worsening symptoms or if your symptoms do not improve within 2 days. Please be sure to follow up with your Primary Care Provider within one week regarding your visit today; please call their office to schedule an appointment even if you are feeling better for a follow-up visit. As we discussed I suggest that you have your arm rechecked in the next 24-48 hours to ensure improvement.  If the redness extends past the marker at any point please return to the ER immediately for further treatment.  If you have any new or worsening of symptoms please return for recheck sooner. Please take your antibiotic Keflex as prescribed until complete to help with your symptoms.  Please drink enough water to avoid dehydration and get plenty of rest. Your blood pressure was elevated in the ER today.  Please have your blood pressure rechecked by your primary care provider in the next week to ensure improvement. Your potassium level was low in the emergency department today.  Please take the potassium supplements as prescribed.  Please have your potassium level rechecked by your primary care provider in the next week to ensure improvement. Stop taking the azithromycin and the steroid prednisone.   Please read the additional information packets attached to your discharge summary.  Go to the nearest Emergency Department immediately if: You have fever or chills You feel very sleepy. You throw up (vomit) or have watery poop (diarrhea) for a long time. You see red streaks coming from the area. Your red area gets larger. Your red area turns dark in color. If you have any new/concerning or worsening of symptoms.  Do not take your medicine if  develop an itchy  rash, swelling in your mouth or lips, or difficulty breathing; call 911 and seek immediate emergency medical attention if this occurs.  You may review your lab tests and imaging results in their entirety on your MyChart account.  Please discuss all results of fully with your primary care provider and other specialist at your follow-up visit.  Note: Portions of this text may have been transcribed using voice recognition software. Every effort was made to ensure accuracy; however, inadvertent computerized transcription errors may still be present.

## 2021-12-06 LAB — CULTURE, BLOOD (ROUTINE X 2): Special Requests: ADEQUATE

## 2021-12-07 LAB — CULTURE, BLOOD (ROUTINE X 2): Culture: NO GROWTH

## 2021-12-08 LAB — CULTURE, BLOOD (ROUTINE X 2): Special Requests: ADEQUATE

## 2021-12-09 LAB — CULTURE, BLOOD (ROUTINE X 2): Culture: NO GROWTH

## 2021-12-30 DIAGNOSIS — E559 Vitamin D deficiency, unspecified: Secondary | ICD-10-CM | POA: Diagnosis not present

## 2021-12-30 DIAGNOSIS — E663 Overweight: Secondary | ICD-10-CM | POA: Diagnosis not present

## 2022-01-20 DIAGNOSIS — E782 Mixed hyperlipidemia: Secondary | ICD-10-CM | POA: Diagnosis not present

## 2022-01-20 DIAGNOSIS — Z6827 Body mass index (BMI) 27.0-27.9, adult: Secondary | ICD-10-CM | POA: Diagnosis not present

## 2022-02-24 DIAGNOSIS — Z6827 Body mass index (BMI) 27.0-27.9, adult: Secondary | ICD-10-CM | POA: Diagnosis not present

## 2022-02-24 DIAGNOSIS — E782 Mixed hyperlipidemia: Secondary | ICD-10-CM | POA: Diagnosis not present

## 2022-03-03 DIAGNOSIS — Z79891 Long term (current) use of opiate analgesic: Secondary | ICD-10-CM | POA: Diagnosis not present

## 2022-03-03 DIAGNOSIS — Z79899 Other long term (current) drug therapy: Secondary | ICD-10-CM | POA: Diagnosis not present

## 2022-03-03 DIAGNOSIS — Z5181 Encounter for therapeutic drug level monitoring: Secondary | ICD-10-CM | POA: Diagnosis not present

## 2022-04-10 DIAGNOSIS — Z5181 Encounter for therapeutic drug level monitoring: Secondary | ICD-10-CM | POA: Diagnosis not present

## 2022-04-10 DIAGNOSIS — Z79899 Other long term (current) drug therapy: Secondary | ICD-10-CM | POA: Diagnosis not present

## 2022-04-10 DIAGNOSIS — Z79891 Long term (current) use of opiate analgesic: Secondary | ICD-10-CM | POA: Diagnosis not present

## 2022-06-19 DIAGNOSIS — F419 Anxiety disorder, unspecified: Secondary | ICD-10-CM | POA: Diagnosis not present

## 2022-06-19 DIAGNOSIS — Z5181 Encounter for therapeutic drug level monitoring: Secondary | ICD-10-CM | POA: Diagnosis not present

## 2022-06-19 DIAGNOSIS — F900 Attention-deficit hyperactivity disorder, predominantly inattentive type: Secondary | ICD-10-CM | POA: Diagnosis not present

## 2022-07-17 DIAGNOSIS — F900 Attention-deficit hyperactivity disorder, predominantly inattentive type: Secondary | ICD-10-CM | POA: Diagnosis not present

## 2022-07-17 DIAGNOSIS — F419 Anxiety disorder, unspecified: Secondary | ICD-10-CM | POA: Diagnosis not present

## 2022-08-12 DIAGNOSIS — F900 Attention-deficit hyperactivity disorder, predominantly inattentive type: Secondary | ICD-10-CM | POA: Diagnosis not present

## 2022-08-12 DIAGNOSIS — F419 Anxiety disorder, unspecified: Secondary | ICD-10-CM | POA: Diagnosis not present

## 2022-12-05 ENCOUNTER — Other Ambulatory Visit: Payer: Self-pay

## 2022-12-05 ENCOUNTER — Inpatient Hospital Stay (HOSPITAL_BASED_OUTPATIENT_CLINIC_OR_DEPARTMENT_OTHER)
Admission: EM | Admit: 2022-12-05 | Discharge: 2022-12-09 | DRG: 286 | Disposition: A | Discharge status: Home / Self Care | Payer: 59 | Attending: Internal Medicine | Admitting: Internal Medicine

## 2022-12-05 ENCOUNTER — Emergency Department (HOSPITAL_BASED_OUTPATIENT_CLINIC_OR_DEPARTMENT_OTHER): Payer: 59

## 2022-12-05 ENCOUNTER — Encounter (HOSPITAL_BASED_OUTPATIENT_CLINIC_OR_DEPARTMENT_OTHER): Payer: Self-pay

## 2022-12-05 ENCOUNTER — Emergency Department (HOSPITAL_BASED_OUTPATIENT_CLINIC_OR_DEPARTMENT_OTHER): Payer: 59 | Admitting: Radiology

## 2022-12-05 DIAGNOSIS — Z87891 Personal history of nicotine dependence: Secondary | ICD-10-CM

## 2022-12-05 DIAGNOSIS — I110 Hypertensive heart disease with heart failure: Principal | ICD-10-CM | POA: Diagnosis present

## 2022-12-05 DIAGNOSIS — J189 Pneumonia, unspecified organism: Secondary | ICD-10-CM | POA: Diagnosis present

## 2022-12-05 DIAGNOSIS — R000 Tachycardia, unspecified: Secondary | ICD-10-CM | POA: Diagnosis not present

## 2022-12-05 DIAGNOSIS — I426 Alcoholic cardiomyopathy: Secondary | ICD-10-CM | POA: Diagnosis not present

## 2022-12-05 DIAGNOSIS — E876 Hypokalemia: Secondary | ICD-10-CM | POA: Diagnosis present

## 2022-12-05 DIAGNOSIS — I5021 Acute systolic (congestive) heart failure: Secondary | ICD-10-CM | POA: Diagnosis present

## 2022-12-05 DIAGNOSIS — R7989 Other specified abnormal findings of blood chemistry: Secondary | ICD-10-CM | POA: Diagnosis not present

## 2022-12-05 DIAGNOSIS — R911 Solitary pulmonary nodule: Secondary | ICD-10-CM | POA: Diagnosis not present

## 2022-12-05 DIAGNOSIS — F411 Generalized anxiety disorder: Secondary | ICD-10-CM | POA: Insufficient documentation

## 2022-12-05 DIAGNOSIS — Z1152 Encounter for screening for COVID-19: Secondary | ICD-10-CM | POA: Diagnosis not present

## 2022-12-05 DIAGNOSIS — Z888 Allergy status to other drugs, medicaments and biological substances status: Secondary | ICD-10-CM

## 2022-12-05 DIAGNOSIS — R7401 Elevation of levels of liver transaminase levels: Secondary | ICD-10-CM | POA: Diagnosis not present

## 2022-12-05 DIAGNOSIS — I340 Nonrheumatic mitral (valve) insufficiency: Secondary | ICD-10-CM | POA: Diagnosis present

## 2022-12-05 DIAGNOSIS — I493 Ventricular premature depolarization: Secondary | ICD-10-CM | POA: Diagnosis not present

## 2022-12-05 DIAGNOSIS — E872 Acidosis, unspecified: Secondary | ICD-10-CM | POA: Diagnosis not present

## 2022-12-05 DIAGNOSIS — N179 Acute kidney failure, unspecified: Secondary | ICD-10-CM | POA: Diagnosis present

## 2022-12-05 DIAGNOSIS — Z8249 Family history of ischemic heart disease and other diseases of the circulatory system: Secondary | ICD-10-CM | POA: Diagnosis not present

## 2022-12-05 DIAGNOSIS — J40 Bronchitis, not specified as acute or chronic: Secondary | ICD-10-CM | POA: Insufficient documentation

## 2022-12-05 DIAGNOSIS — I428 Other cardiomyopathies: Secondary | ICD-10-CM | POA: Diagnosis not present

## 2022-12-05 DIAGNOSIS — Z7984 Long term (current) use of oral hypoglycemic drugs: Secondary | ICD-10-CM

## 2022-12-05 DIAGNOSIS — Z79899 Other long term (current) drug therapy: Secondary | ICD-10-CM | POA: Diagnosis not present

## 2022-12-05 DIAGNOSIS — I509 Heart failure, unspecified: Secondary | ICD-10-CM

## 2022-12-05 DIAGNOSIS — K5900 Constipation, unspecified: Secondary | ICD-10-CM | POA: Diagnosis present

## 2022-12-05 DIAGNOSIS — I959 Hypotension, unspecified: Secondary | ICD-10-CM | POA: Diagnosis not present

## 2022-12-05 DIAGNOSIS — R739 Hyperglycemia, unspecified: Secondary | ICD-10-CM | POA: Diagnosis present

## 2022-12-05 DIAGNOSIS — I10 Essential (primary) hypertension: Secondary | ICD-10-CM | POA: Insufficient documentation

## 2022-12-05 DIAGNOSIS — F10939 Alcohol use, unspecified with withdrawal, unspecified: Secondary | ICD-10-CM | POA: Insufficient documentation

## 2022-12-05 DIAGNOSIS — F10930 Alcohol use, unspecified with withdrawal, uncomplicated: Secondary | ICD-10-CM

## 2022-12-05 DIAGNOSIS — I2489 Other forms of acute ischemic heart disease: Secondary | ICD-10-CM | POA: Diagnosis present

## 2022-12-05 DIAGNOSIS — F10139 Alcohol abuse with withdrawal, unspecified: Secondary | ICD-10-CM | POA: Diagnosis present

## 2022-12-05 DIAGNOSIS — J9601 Acute respiratory failure with hypoxia: Secondary | ICD-10-CM | POA: Diagnosis not present

## 2022-12-05 DIAGNOSIS — Z981 Arthrodesis status: Secondary | ICD-10-CM | POA: Diagnosis not present

## 2022-12-05 DIAGNOSIS — J90 Pleural effusion, not elsewhere classified: Principal | ICD-10-CM

## 2022-12-05 DIAGNOSIS — F1010 Alcohol abuse, uncomplicated: Secondary | ICD-10-CM | POA: Diagnosis present

## 2022-12-05 DIAGNOSIS — R918 Other nonspecific abnormal finding of lung field: Secondary | ICD-10-CM | POA: Diagnosis not present

## 2022-12-05 DIAGNOSIS — R0602 Shortness of breath: Secondary | ICD-10-CM | POA: Diagnosis not present

## 2022-12-05 HISTORY — DX: Anxiety disorder, unspecified: F41.9

## 2022-12-05 LAB — SARS CORONAVIRUS 2 BY RT PCR: SARS Coronavirus 2 by RT PCR: NEGATIVE

## 2022-12-05 LAB — CBC WITH DIFFERENTIAL/PLATELET
Abs Immature Granulocytes: 0.03 10*3/uL (ref 0.00–0.07)
Basophils Absolute: 0.1 10*3/uL (ref 0.0–0.1)
Basophils Relative: 1 %
Eosinophils Absolute: 0.1 10*3/uL (ref 0.0–0.5)
Eosinophils Relative: 2 %
HCT: 38.6 % (ref 36.0–46.0)
Hemoglobin: 13.6 g/dL (ref 12.0–15.0)
Immature Granulocytes: 0 %
Lymphocytes Relative: 13 %
Lymphs Abs: 1.1 10*3/uL (ref 0.7–4.0)
MCH: 34.2 pg — ABNORMAL HIGH (ref 26.0–34.0)
MCHC: 35.2 g/dL (ref 30.0–36.0)
MCV: 97 fL (ref 80.0–100.0)
Monocytes Absolute: 0.5 10*3/uL (ref 0.1–1.0)
Monocytes Relative: 5 %
Neutro Abs: 7 10*3/uL (ref 1.7–7.7)
Neutrophils Relative %: 79 %
Platelets: 335 10*3/uL (ref 150–400)
RBC: 3.98 MIL/uL (ref 3.87–5.11)
RDW: 11.9 % (ref 11.5–15.5)
WBC: 8.8 10*3/uL (ref 4.0–10.5)
nRBC: 0 % (ref 0.0–0.2)

## 2022-12-05 LAB — LACTIC ACID, PLASMA
Lactic Acid, Venous: 1.5 mmol/L (ref 0.5–1.9)
Lactic Acid, Venous: 0.9 mmol/L (ref 0.5–1.9)

## 2022-12-05 LAB — COMPREHENSIVE METABOLIC PANEL
ALT: 45 U/L — ABNORMAL HIGH (ref 0–44)
AST: 69 U/L — ABNORMAL HIGH (ref 15–41)
Albumin: 4.3 g/dL (ref 3.5–5.0)
Alkaline Phosphatase: 62 U/L (ref 38–126)
Anion gap: 12 (ref 5–15)
BUN: 7 mg/dL (ref 6–20)
CO2: 21 mmol/L — ABNORMAL LOW (ref 22–32)
Calcium: 9.4 mg/dL (ref 8.9–10.3)
Chloride: 107 mmol/L (ref 98–111)
Creatinine, Ser: 0.74 mg/dL (ref 0.44–1.00)
GFR, Estimated: >60 mL/min (ref >60)
Glucose, Bld: 158 mg/dL — ABNORMAL HIGH (ref 70–99)
Potassium: 4 mmol/L (ref 3.5–5.1)
Sodium: 140 mmol/L (ref 135–145)
Total Bilirubin: 0.7 mg/dL (ref 0.3–1.2)
Total Protein: 6.8 g/dL (ref 6.5–8.1)

## 2022-12-05 LAB — URINALYSIS, W/ REFLEX TO CULTURE (INFECTION SUSPECTED)
Bacteria, UA: NONE SEEN
Bilirubin Urine: NEGATIVE
Glucose, UA: NEGATIVE mg/dL
Ketones, ur: 15 mg/dL — AB
Leukocytes,Ua: NEGATIVE
Nitrite: NEGATIVE
Specific Gravity, Urine: 1.017 (ref 1.005–1.030)
pH: 5.5 (ref 5.0–8.0)

## 2022-12-05 LAB — BRAIN NATRIURETIC PEPTIDE: B Natriuretic Peptide: 662 pg/mL — ABNORMAL HIGH (ref 0.0–100.0)

## 2022-12-05 LAB — TROPONIN I (HIGH SENSITIVITY)
Troponin I (High Sensitivity): 25 ng/L — ABNORMAL HIGH (ref <18)
Troponin I (High Sensitivity): 25 ng/L — ABNORMAL HIGH (ref <18)

## 2022-12-05 LAB — PREGNANCY, URINE: Preg Test, Ur: NEGATIVE

## 2022-12-05 LAB — TSH: TSH: 2 u[IU]/mL (ref 0.350–4.500)

## 2022-12-05 MED ORDER — LEVALBUTEROL HCL 0.63 MG/3ML IN NEBU: 0.6300 mg | INHALATION_SOLUTION | Freq: Four times a day (QID) | RESPIRATORY_TRACT | Status: DC | PRN

## 2022-12-05 MED ORDER — LACTATED RINGERS IV BOLUS
1000.0000 mL | Freq: Once | INTRAVENOUS | Status: AC
Start: 2022-12-05 — End: 2022-12-05
  Administered 2022-12-05: 1000 mL via INTRAVENOUS

## 2022-12-05 MED ORDER — LABETALOL HCL 5 MG/ML IV SOLN: 10.0000 mg | INTRAVENOUS | Status: DC

## 2022-12-05 MED ORDER — METOPROLOL TARTRATE 12.5 MG HALF TABLET
12.5000 mg | ORAL_TABLET | Freq: Two times a day (BID) | ORAL | Status: DC
  Administered 2022-12-06 (×3): 12.5 mg via ORAL
  Filled 2022-12-05 (×3): qty 1

## 2022-12-05 MED ORDER — SODIUM CHLORIDE 0.9% FLUSH: 3.0000 mL | INTRAVENOUS | Status: DC | PRN

## 2022-12-05 MED ORDER — SODIUM CHLORIDE 0.9 % IV SOLN
100.0000 mg | Freq: Two times a day (BID) | INTRAVENOUS | Status: DC
  Administered 2022-12-06: 100 mg via INTRAVENOUS
  Filled 2022-12-05 (×2): qty 100

## 2022-12-05 MED ORDER — FUROSEMIDE 10 MG/ML IJ SOLN
40.0000 mg | Freq: Once | INTRAMUSCULAR | Status: AC
  Administered 2022-12-05: 40 mg via INTRAVENOUS
  Filled 2022-12-05: qty 4

## 2022-12-05 MED ORDER — ADULT MULTIVITAMIN W/MINERALS CH
1.0000 | ORAL_TABLET | Freq: Every day | ORAL | Status: DC
  Administered 2022-12-05 – 2022-12-09 (×5): 1 via ORAL
  Filled 2022-12-05 (×5): qty 1

## 2022-12-05 MED ORDER — FUROSEMIDE 10 MG/ML IJ SOLN: 20.0000 mg | Freq: Two times a day (BID) | INTRAMUSCULAR | Status: DC

## 2022-12-05 MED ORDER — SODIUM CHLORIDE 0.9 % IV SOLN: 250.0000 mL | INTRAVENOUS | Status: DC | PRN

## 2022-12-05 MED ORDER — LABETALOL HCL 5 MG/ML IV SOLN
10.0000 mg | Freq: Four times a day (QID) | INTRAVENOUS | Status: DC | PRN
  Administered 2022-12-05: 10 mg via INTRAVENOUS
  Filled 2022-12-05: qty 4

## 2022-12-05 MED ORDER — THIAMINE HCL 100 MG/ML IJ SOLN
100.0000 mg | Freq: Every day | INTRAMUSCULAR | Status: DC
  Filled 2022-12-05: qty 2

## 2022-12-05 MED ORDER — IOHEXOL 350 MG/ML SOLN
80.0000 mL | Freq: Once | INTRAVENOUS | Status: AC | PRN
  Administered 2022-12-05: 80 mL via INTRAVENOUS

## 2022-12-05 MED ORDER — FOLIC ACID 1 MG PO TABS
1.0000 mg | ORAL_TABLET | Freq: Every day | ORAL | Status: DC
  Administered 2022-12-05 – 2022-12-09 (×5): 1 mg via ORAL
  Filled 2022-12-05 (×5): qty 1

## 2022-12-05 MED ORDER — LOPERAMIDE HCL 2 MG PO CAPS: 2.0000 mg | ORAL_CAPSULE | ORAL | Status: AC | PRN

## 2022-12-05 MED ORDER — LORAZEPAM 2 MG/ML IJ SOLN
1.0000 mg | INTRAMUSCULAR | Status: AC | PRN
  Administered 2022-12-06: 1 mg via INTRAVENOUS
  Filled 2022-12-05: qty 1

## 2022-12-05 MED ORDER — GUAIFENESIN ER 600 MG PO TB12
600.0000 mg | ORAL_TABLET | Freq: Two times a day (BID) | ORAL | Status: DC
  Administered 2022-12-06 – 2022-12-09 (×8): 600 mg via ORAL
  Filled 2022-12-05 (×8): qty 1

## 2022-12-05 MED ORDER — LORAZEPAM 1 MG PO TABS
1.0000 mg | ORAL_TABLET | ORAL | Status: AC | PRN
Start: 2022-12-05 — End: 2022-12-08
  Administered 2022-12-05 (×2): 1 mg via ORAL
  Filled 2022-12-05 (×2): qty 1

## 2022-12-05 MED ORDER — SODIUM CHLORIDE 0.9% FLUSH
3.0000 mL | Freq: Two times a day (BID) | INTRAVENOUS | Status: DC
Start: 2022-12-06 — End: 2022-12-06

## 2022-12-05 MED ORDER — SENNOSIDES-DOCUSATE SODIUM 8.6-50 MG PO TABS: 1.0000 | ORAL_TABLET | Freq: Every evening | ORAL | Status: DC | PRN

## 2022-12-05 MED ORDER — CHLORDIAZEPOXIDE HCL 5 MG PO CAPS
25.0000 mg | ORAL_CAPSULE | Freq: Four times a day (QID) | ORAL | Status: AC
  Administered 2022-12-06 (×4): 25 mg via ORAL
  Filled 2022-12-05 (×4): qty 5

## 2022-12-05 MED ORDER — ACETAMINOPHEN 650 MG RE SUPP: 650.0000 mg | Freq: Four times a day (QID) | RECTAL | Status: DC | PRN

## 2022-12-05 MED ORDER — ACETAMINOPHEN 325 MG PO TABS
650.0000 mg | ORAL_TABLET | Freq: Four times a day (QID) | ORAL | Status: DC | PRN
  Administered 2022-12-06 – 2022-12-08 (×2): 650 mg via ORAL
  Filled 2022-12-05 (×2): qty 2

## 2022-12-05 MED ORDER — CHLORDIAZEPOXIDE HCL 5 MG PO CAPS
25.0000 mg | ORAL_CAPSULE | Freq: Three times a day (TID) | ORAL | Status: AC
  Administered 2022-12-07 (×3): 25 mg via ORAL
  Filled 2022-12-05 (×4): qty 5

## 2022-12-05 MED ORDER — ENOXAPARIN SODIUM 40 MG/0.4ML IJ SOSY
40.0000 mg | PREFILLED_SYRINGE | INTRAMUSCULAR | Status: DC
  Administered 2022-12-06 – 2022-12-07 (×2): 40 mg via SUBCUTANEOUS
  Filled 2022-12-05 (×2): qty 0.4

## 2022-12-05 MED ORDER — THIAMINE MONONITRATE 100 MG PO TABS
100.0000 mg | ORAL_TABLET | Freq: Every day | ORAL | Status: DC
  Administered 2022-12-05 – 2022-12-09 (×5): 100 mg via ORAL
  Filled 2022-12-05 (×5): qty 1

## 2022-12-05 MED ORDER — CHLORDIAZEPOXIDE HCL 25 MG PO CAPS
25.0000 mg | ORAL_CAPSULE | ORAL | Status: AC
  Administered 2022-12-07 – 2022-12-08 (×2): 25 mg via ORAL
  Filled 2022-12-05: qty 1
  Filled 2022-12-05: qty 5

## 2022-12-05 MED ORDER — ONDANSETRON 4 MG PO TBDP: 4.0000 mg | ORAL_TABLET | Freq: Four times a day (QID) | ORAL | Status: AC | PRN

## 2022-12-05 MED ORDER — CHLORDIAZEPOXIDE HCL 25 MG PO CAPS
25.0000 mg | ORAL_CAPSULE | Freq: Every day | ORAL | Status: AC
  Administered 2022-12-08: 25 mg via ORAL
  Filled 2022-12-05: qty 1

## 2022-12-05 MED ORDER — METOPROLOL TARTRATE 12.5 MG HALF TABLET: 12.5000 mg | ORAL_TABLET | Freq: Two times a day (BID) | ORAL | Status: DC

## 2022-12-05 NOTE — ED Provider Notes (Signed)
[place] EMERGENCY DEPARTMENT AT [facility] Provider Note   CSN: [id number] Arrival date & time: 12/05/22  1529     History  Chief Complaint  Patient presents with   Shortness of Breath   Palpitations    Hannah Bullock is a 47 y.o. female.  Patient is a 47 year old female with a past medical history of anxiety, allergies, recently diagnosed hypertension and alcohol use disorder presenting to the emergency department with shortness of breath.  The patient states that over the last several weeks to months she has had increasing shortness of breath.  She states initially she would feel short of breath with activities and now will feel short of breath even from just walking across a parking lot.  She states she has had an associated dry cough and congestion.  She denies any fevers.  She states that she has some chest tightness but states that this feels similar to her anxiety.  She denies any nausea or vomiting.  She states that she will have intermittent diarrhea and constipation.  She states that she has had abnormal periods recently that have been prolonged though not significantly heavy.  She denies any history of thyroid dysfunction.  She does report a family history of VTE but denies any personal history.  She is on estrogen therapy.  She denies any recent hospitalization or surgery, recent long travel in the car or plane or cancer history.  She states that she had a virtual visit with her doctor on Wednesday and was started on a antibiotic for bronchitis.  She states that she does drink alcohol daily but denies any history of alcohol withdrawal or withdrawal seizures.  States that she has not smoked tobacco for 6 years.  Denies any drug use.  The history is provided by the patient.  Shortness of Breath Palpitations Associated symptoms: shortness of breath        Home Medications Prior to Admission medications   Medication Sig Start Date End Date Taking? Authorizing  Provider  Cholecalciferol (VITAMIN D-3 PO) Take 1 capsule by mouth daily.    [provider]  ibuprofen (ADVIL) 200 MG tablet Take 800 mg by mouth 3 (three) times daily as needed for fever, headache or mild pain.    [provider]  loratadine (CLARITIN) 10 MG tablet Take 10 mg by mouth daily.    [provider]  phentermine 15 MG capsule Take 15 mg by mouth daily. 10/26/21   [provider]  Phenylephrine-APAP-guaiFENesin (MUCINEX FAST-MAX PO) Take 20 mLs by mouth every 6 (six) hours as needed (fever, headache, flu-like symptoms).    [provider]  potassium chloride SA (KLOR-CON M) 20 MEQ tablet Take 1 tablet (20 mEq total) by mouth daily for 3 days. 12/04/21 12/07/21  Harlene Salts A, PA-C      Allergies    Patient has no known allergies.    Review of Systems   Review of Systems  Respiratory:  Positive for shortness of breath.   Cardiovascular:  Positive for palpitations.    Physical Exam Updated Vital Signs BP (!) 150/104   Pulse (!) 123   Temp 97.8 F (36.6 C) (Oral)   Resp 19   Ht 5\' 4"  (1.626 m)   Wt 77.1 kg   SpO2 95%   BMI 29.18 kg/m  Physical Exam Vitals and nursing note reviewed.  Constitutional:      Appearance: She is well-developed. She is ill-appearing and diaphoretic.     Comments: Anxious appearing  HENT:     Head: Normocephalic and atraumatic.     Mouth/Throat:     Mouth: Mucous membranes are moist.  Eyes:     Extraocular Movements: Extraocular movements intact.  Neck:     Thyroid: No thyromegaly.  Cardiovascular:     Rate and Rhythm: Regular rhythm. Tachycardia present.     Pulses: Normal pulses.     Heart sounds: Normal heart sounds.  Pulmonary:     Effort: Pulmonary effort is normal.     Breath sounds: Normal breath sounds.  Abdominal:     Palpations: Abdomen is soft.     Tenderness: There is no abdominal tenderness.  Musculoskeletal:        General: Normal range of motion.     Cervical back:  Normal range of motion and neck supple.     Right lower leg: No edema.     Left lower leg: No edema.  Skin:    General: Skin is warm.  Neurological:     General: No focal deficit present.     Mental Status: She is alert and oriented to person, place, and time.  Psychiatric:        Mood and Affect: Mood is anxious.        Behavior: Behavior normal.     ED Results / Procedures / Treatments   Labs (all labs ordered are listed, but only abnormal results are displayed) Labs Reviewed  COMPREHENSIVE METABOLIC PANEL - Abnormal; Notable for the following components:      Result Value   CO2 21 (*)    Glucose, Bld 158 (*)    AST 69 (*)    ALT 45 (*)    All other components within normal limits  CBC WITH DIFFERENTIAL/PLATELET - Abnormal; Notable for the following components:   MCH 34.2 (*)    All other components within normal limits  URINALYSIS, W/ REFLEX TO CULTURE (INFECTION SUSPECTED) - Abnormal; Notable for the following components:   Hgb urine dipstick LARGE (*)    Ketones, ur 15 (*)    Protein, ur TRACE (*)    All other components within normal limits  BRAIN NATRIURETIC PEPTIDE - Abnormal; Notable for the following components:   B Natriuretic Peptide 662.0 (*)    All other components within normal limits  TROPONIN I (HIGH SENSITIVITY) - Abnormal; Notable for the following components:   Troponin I (High Sensitivity) 25 (*)    All other components within normal limits  TROPONIN I (HIGH SENSITIVITY) - Abnormal; Notable for the following components:   Troponin I (High Sensitivity) 25 (*)    All other components within normal limits  SARS CORONAVIRUS 2 BY RT PCR  LACTIC ACID, PLASMA  LACTIC ACID, PLASMA  PREGNANCY, URINE  TSH    EKG EKG Interpretation Date/Time:  Friday December 05 2022 15:45:45 EDT Ventricular Rate:  129 PR Interval:  128 QRS Duration:  82 QT Interval:  316 QTC Calculation: 462 R Axis:   86  Text Interpretation: Sinus tachycardia with occasional  Premature ventricular complexes Possible Left atrial enlargement Nonspecific ST and T wave abnormality Abnormal ECG  Inferoseptal t-wave inversions/flattening and rate increased compared to prior EKG Confirmed by Elayne Snare ([id number]) on 12/05/2022 3:49:24 PM  Radiology CT Angio Chest PE W/Cm &/Or Wo Cm  Result Date: 12/05/2022 CLINICAL DATA:  New shortness of breath and tachycardia. Recovering from a respiratory infection. EXAM: CT ANGIOGRAPHY CHEST WITH CONTRAST TECHNIQUE: Multidetector CT imaging of the chest was performed using the standard protocol during  bolus administration of intravenous contrast. Multiplanar CT image reconstructions and MIPs were obtained to evaluate the vascular anatomy. RADIATION DOSE REDUCTION: This exam was performed according to the departmental dose-optimization program which includes automated exposure control, adjustment of the mA and/or kV according to patient size and/or use of iterative reconstruction technique. CONTRAST:  80mL OMNIPAQUE IOHEXOL 350 MG/ML SOLN COMPARISON:  Radiographs 12/05/2022 FINDINGS: Cardiovascular: No filling defect is identified in the pulmonary arterial tree to suggest pulmonary embolus. Mild cardiomegaly. Mediastinum/Nodes: No pathologic adenopathy. Lungs/Pleura: Moderate-sized bilateral pleural effusions with passive atelectasis. Secondary pulmonary lobular septal thickening especially in the lung apices, with mosaic attenuation suspicious for congestive heart failure. However, there is also scattered subsolid nodularity and near solid nodularity in the lungs favoring the upper lobes, and atypical infectious process is not excluded. Bilateral airway thickening is present. Upper Abdomen: Suspected benign steatosis posteriorly in segment 4 of the liver for example on image 152 of series 4 Exophytic 1.7 by 1.4 cm lesion along the right kidney upper pole, internal density 21 Hounsfield units. Appearance favors a benign Bosniak category 2 cyst. No  further imaging workup of this lesion is indicated. Musculoskeletal: Lower cervical plate and screw fixator. Review of the MIP images confirms the above findings. IMPRESSION: 1. No filling defect is identified in the pulmonary arterial tree to suggest pulmonary embolus. 2. Moderate-sized bilateral pleural effusions with passive atelectasis. 3. Pulmonary lobular septal thickening especially in the lung apices, with mosaic attenuation suspicious for congestive heart failure. 4. However, there is also scattered subsolid nodularity and near solid nodularity in the lungs favoring the upper lobes, and atypical infectious process is not excluded. 5. Bilateral airway thickening. Electronically Signed   By: Gaylyn Rong M.D.   On: 12/05/2022 19:08   DG Chest 2 View  Result Date: 12/05/2022 CLINICAL DATA:  Shortness of breath. Heart racing since beginning of week. EXAM: CHEST - 2 VIEW COMPARISON:  None Available. FINDINGS: Cardiac silhouette and mediastinal contours are within limits. Moderate bilateral interstitial thickening. Minimal blunting of the left costophrenic angle on frontal view. Minimal blunting of the posterior costophrenic angles on lateral view no pneumothorax. Mild multilevel degenerative disc changes of the thoracic spine. ACDF hardware overlies the lower cervical spine. IMPRESSION: 1. Moderate bilateral interstitial thickening, which may represent pulmonary edema or atypical infection. 2. Minimal blunting of the posterior costophrenic angles on lateral view, which may represent trace pleural effusions. Electronically Signed   By: Neita Garnet M.D.   On: 12/05/2022 17:09    Procedures Procedures    Medications Ordered in ED Medications  LORazepam (ATIVAN) tablet 1-4 mg (1 mg Oral Given 12/05/22 1646)    Or  LORazepam (ATIVAN) injection 1-4 mg ( Intravenous See Alternative 12/05/22 1646)  thiamine (VITAMIN B1) tablet 100 mg (100 mg Oral Given 12/05/22 1646)    Or  thiamine (VITAMIN  B1) injection 100 mg ( Intravenous See Alternative 12/05/22 1646)  folic acid (FOLVITE) tablet 1 mg (1 mg Oral Given 12/05/22 1832)  multivitamin with minerals tablet 1 tablet (1 tablet Oral Given 12/05/22 1646)  furosemide (LASIX) injection 40 mg (has no administration in time range)  lactated ringers bolus 1,000 mL ( Intravenous Stopped 12/05/22 1941)  iohexol (OMNIPAQUE) 350 MG/ML injection 80 mL (80 mLs Intravenous Contrast Given 12/05/22 1711)    ED Course/ Medical Decision Making/ A&P Clinical Course as of 12/05/22 2011  Fri Dec 05, 2022  1930 No PE on CT but has bilateral pleural effusions and findings concerning for CHF, cannot rule  out infection. No fever or elevated WBC so favor CHF. Will start diuresis. With persistent tachycardia and EKG changes will recommend admission. [VK]    Clinical Course User Index [VK] Rexford Maus, DO                                 Medical Decision Making This patient presents to the ED with chief complaint(s) of SOB with pertinent past medical history of ETOH use disorder, anxiety, hypertension which further complicates the presenting complaint. The complaint involves an extensive differential diagnosis and also carries with it a high risk of complications and morbidity.    The differential diagnosis includes ACS, arrhythmia, anemia, dehydration, electrolyte abnormality, pneumonia, pneumothorax, pulmonary edema, pleural effusion, considering PE with tachycardia and hormone use, considering alcohol withdrawal with hypertension, tachycardia diaphoresis and anxious appearing, viral syndrome, thyroid dysfunction  Additional history obtained: Additional history obtained from N/A Records reviewed N/A  ED Course and Reassessment: On patient's arrival to the emergency department she is hypertensive and tachycardic, anxious appearing and diaphoretic.  EKG was performed on arrival that showed sinus tachycardia with nonspecific T wave changes.  Patient  initially had labs performed in triage including CBC, CMP, troponin and lactate as well as urine.  Urine had some ketones and no signs of infection, pregnancy test is negative.  CBC is within normal range, remainder of labs are pending.  Patient will additionally have BNP, COVID swab and TSH.  Due to patient's hormone use and tachycardia concern for possible PE and will of CT PE study.  Patient does appear somewhat consistent with alcohol withdrawal with her tachycardia, diaphoresis mild tremors and anxious appearing and will be placed on CIWA protocol.  Patient will be closely reassessed.  Independent labs interpretation:  The following labs were independently interpreted: Mildly elevated troponin flat, elevated BNP, mild transaminitis  Independent visualization of imaging: - I independently visualized the following imaging with scope of interpretation limited to determining acute life threatening conditions related to emergency care: CT PE study, which revealed no evidence of PE, bilateral pleural effusions and vascular congestion concerning for CHF  Consultation: - Consulted or discussed management/test interpretation w/ external professional: Hospitalists  Consideration for admission or further workup: Patient requires admission for new onset CHF Social Determinants of health: N/A    Amount and/or Complexity of Data Reviewed Labs: ordered. Radiology: ordered.  Risk OTC drugs. Prescription drug management. Decision regarding hospitalization.          Final Clinical Impression(s) / ED Diagnoses Final diagnoses:  Bilateral pleural effusion  Acute congestive heart failure, unspecified heart failure type (HCC)  Sinus tachycardia    Rx / DC Orders ED Discharge Orders     None         Rexford Maus, DO 12/05/22 2011

## 2022-12-05 NOTE — ED Triage Notes (Addendum)
Pt states that she is getting over a respiratory infection and was recently put on metoprolol for htn. Pt now reports shob, feeling like her heart is racing, and generalized weakness. Pt notices symptoms worsen after she eats. Pt anxious in triage. Pt last drink last night.

## 2022-12-05 NOTE — ED Notes (Signed)
Pt stated she does have 5-6 drinks a night and has had no alcohol today. CIWA was completed due to same.

## 2022-12-05 NOTE — ED Notes (Signed)
Nurse receiving Pt care was in another room when I called to give report. I was asked to call back.

## 2022-12-05 NOTE — H&P (Signed)
History and Physical    Hannah Bullock KDX:[id number] DOB: 08-Sep-1975 DOA: 12/05/2022  PCP: Leilani Able, MD   Patient coming from: Home   Chief Complaint:  Chief Complaint  Patient presents with   Shortness of Breath   Palpitations   ED TRIAGE note:Pt states that she is getting over a respiratory infection and was recently put on metoprolol for htn. Pt now reports shob, feeling like her heart is racing, and generalized weakness. Pt notices symptoms worsen after she eats. Pt anxious in triage. Pt last drink last night.   HPI:  Hannah Bullock is a 47 y.o. female with medical history significant of essential hypertension, chronic alcohol use disorder, generalized anxiety disorder, upper GI bleed due to gastritis in the past, gastric ulcer, and chronically smoker resented to emergency department for evaluation for generalized weakness, palpitation, and shortness of breath that has been worsening for last few days.  Patient reported continues to drink alcohol and last drink 10/24. The patient states that over the last several weeks to months she has had increasing shortness of breath.  She states initially she would feel short of breath with activities and now will feel short of breath even from just walking across a parking lot.  She states she has had an associated dry cough and congestion.  She denies any fevers.  She states that she has some chest tightness but states that this feels similar to her anxiety.  She denies any nausea or vomiting.  She states that she will have intermittent diarrhea and constipation.  She states that she has had abnormal periods recently that have been prolonged though not significantly heavy.  She denies any history of thyroid dysfunction.    She states that she had a virtual visit with her doctor on Wednesday and was started on a antibiotic for bronchitis.  She states that she does drink alcohol daily but denies any history of alcohol withdrawal or withdrawal seizures.   States that she has not smoked tobacco for 6 years.  Denies any drug use.    ED Course:  At initial presentation to ED patient found tachycardic heart rate 120 to 134.  Elevated blood pressure 182/114.  Respiratory 25, O2 sat 100% room air.  EKG showing sinus tachycardia with occasional premature ventricular complex.  Heart rate 129. CMP unremarkable except low bicarb 129, elevated AST/ALT 69/45 otherwise unremarkable. CBC unremarkable. UA hemoglobin positive, ketone positive and protein trace. Pregnancy test negative. Lactic acid unremarkable. COVID test negative. TSH within normal range. Elevated troponin 25.  Chest x-ray 1. Moderate bilateral interstitial thickening, which may represent pulmonary edema or atypical infection. 2. Minimal blunting of the posterior costophrenic angles on lateral view, which may represent trace pleural effusions.  Following CTA chest: IMPRESSION: 1. No filling defect is identified in the pulmonary arterial tree to suggest pulmonary embolus. 2. Moderate-sized bilateral pleural effusions with passive atelectasis. 3. Pulmonary lobular septal thickening especially in the lung apices, with mosaic attenuation suspicious for congestive heart failure. 4. However, there is also scattered subsolid nodularity and near solid nodularity in the lungs favoring the upper lobes, and atypical infectious process is not excluded. 5. Bilateral airway thickening.  In the ED patient received Lasix 40 mg IV once and 1 L of LR bolus.  CIWA protocol with as needed Ativan has been initiated.  Review of Systems:  Review of Systems  Constitutional:  Negative for chills, fever, malaise/fatigue and weight loss.  Respiratory:  Positive for cough, sputum production and shortness of breath.  Negative for wheezing.   Cardiovascular:  Positive for palpitations and orthopnea. Negative for chest pain, claudication and leg swelling.  Gastrointestinal:  Negative for abdominal pain,  diarrhea, heartburn, nausea and vomiting.  Genitourinary:  Negative for dysuria, frequency and urgency.  Musculoskeletal:  Negative for back pain, myalgias and neck pain.  Neurological:  Negative for dizziness and headaches.  Psychiatric/Behavioral:  The patient is not nervous/anxious.     Past Medical History:  Diagnosis Date   Anxiety    Medical history non-contributory     Past Surgical History:  Procedure Laterality Date   ESOPHAGOGASTRODUODENOSCOPY N/A 04/06/2017   Procedure: ESOPHAGOGASTRODUODENOSCOPY (EGD);  Surgeon: Beverley Fiedler, MD;  Location: [facility] ENDOSCOPY;  Service: Gastroenterology;  Laterality: N/A;   KNEE SURGERY     NECK SURGERY       reports that she quit smoking about 6 years ago. Her smoking use included cigarettes. She has never used smokeless tobacco. She reports current alcohol use of about 35.0 standard drinks of alcohol per week. She reports that she does not use drugs.  No Known Allergies  Family History  Problem Relation Age of Onset   Heart disease Father    Heart disease Paternal Grandfather     Prior to Admission medications   Medication Sig Start Date End Date Taking? Authorizing Provider  Cholecalciferol (VITAMIN D-3 PO) Take 1 capsule by mouth daily.    [provider]  ibuprofen (ADVIL) 200 MG tablet Take 800 mg by mouth 3 (three) times daily as needed for fever, headache or mild pain.    [provider]  loratadine (CLARITIN) 10 MG tablet Take 10 mg by mouth daily.    [provider]  phentermine 15 MG capsule Take 15 mg by mouth daily. 10/26/21   [provider]  Phenylephrine-APAP-guaiFENesin (MUCINEX FAST-MAX PO) Take 20 mLs by mouth every 6 (six) hours as needed (fever, headache, flu-like symptoms).    [provider]  potassium chloride SA (KLOR-CON M) 20 MEQ tablet Take 1 tablet (20 mEq total) by mouth daily for 3 days. 12/04/21 12/07/21  Bill Salinas, PA-C     Physical Exam: Vitals:    12/05/22 1925 12/05/22 1930 12/05/22 2200 12/05/22 2244  BP:  (!) 166/114 (!) 152/112 (!) 165/111  Pulse:  (!) 121 (!) 126 (!) 127  Resp:  19 (!) 27 (!) 25  Temp: 97.8 F (36.6 C)   97.8 F (36.6 C)  TempSrc: Oral   Oral  SpO2:  98% 93% 97%  Weight:      Height:        Physical Exam Constitutional:      General: She is not in acute distress.    Appearance: She is not ill-appearing.  Cardiovascular:     Rate and Rhythm: Regular rhythm. Tachycardia present.     Heart sounds: No murmur heard. Pulmonary:     Effort: Pulmonary effort is normal.     Breath sounds: Normal breath sounds. No decreased breath sounds, wheezing, rhonchi or rales.  Musculoskeletal:        General: Normal range of motion.     Cervical back: Normal range of motion and neck supple.     Right lower leg: No edema.     Left lower leg: No edema.  Skin:    Findings: No ecchymosis or rash.  Neurological:     Mental Status: She is oriented to person, place, and time.  Psychiatric:        Mood and Affect:  Mood is not anxious.        Behavior: Behavior is not agitated.      Labs on Admission: I have personally reviewed following labs and imaging studies  CBC: Recent Labs  Lab 12/05/22 1541  WBC 8.8  NEUTROABS 7.0  HGB 13.6  HCT 38.6  MCV 97.0  PLT 335   Basic Metabolic Panel: Recent Labs  Lab 12/05/22 1541  NA 140  K 4.0  CL 107  CO2 21*  GLUCOSE 158*  BUN 7  CREATININE 0.74  CALCIUM 9.4   GFR: Estimated Creatinine Clearance: 88.4 mL/min (by C-G formula based on SCr of 0.74 mg/dL). Liver Function Tests: Recent Labs  Lab 12/05/22 1541  AST 69*  ALT 45*  ALKPHOS 62  BILITOT 0.7  PROT 6.8  ALBUMIN 4.3   No results for input(s): "LIPASE", "AMYLASE" in the last 168 hours. No results for input(s): "AMMONIA" in the last 168 hours. Coagulation Profile: No results for input(s): "INR", "PROTIME" in the last 168 hours. Cardiac Enzymes: Recent Labs  Lab 12/05/22 1541 12/05/22 1839   TROPONINIHS 25* 25*   BNP (last 3 results) Recent Labs    12/05/22 1638  BNP 662.0*   HbA1C: No results for input(s): "HGBA1C" in the last 72 hours. CBG: No results for input(s): "GLUCAP" in the last 168 hours. Lipid Profile: No results for input(s): "CHOL", "HDL", "LDLCALC", "TRIG", "CHOLHDL", "LDLDIRECT" in the last 72 hours. Thyroid Function Tests: Recent Labs    12/05/22 1638  TSH 2.000   Anemia Panel: No results for input(s): "VITAMINB12", "FOLATE", "FERRITIN", "TIBC", "IRON", "RETICCTPCT" in the last 72 hours. Urine analysis:    Component Value Date/Time   COLORURINE YELLOW 12/05/2022 1541   APPEARANCEUR CLEAR 12/05/2022 1541   LABSPEC 1.017 12/05/2022 1541   PHURINE 5.5 12/05/2022 1541   GLUCOSEU NEGATIVE 12/05/2022 1541   HGBUR LARGE (A) 12/05/2022 1541   BILIRUBINUR NEGATIVE 12/05/2022 1541   KETONESUR 15 (A) 12/05/2022 1541   PROTEINUR TRACE (A) 12/05/2022 1541   NITRITE NEGATIVE 12/05/2022 1541   LEUKOCYTESUR NEGATIVE 12/05/2022 1541    Radiological Exams on Admission: I have personally reviewed images CT Angio Chest PE W/Cm &/Or Wo Cm  Result Date: 12/05/2022 CLINICAL DATA:  New shortness of breath and tachycardia. Recovering from a respiratory infection. EXAM: CT ANGIOGRAPHY CHEST WITH CONTRAST TECHNIQUE: Multidetector CT imaging of the chest was performed using the standard protocol during bolus administration of intravenous contrast. Multiplanar CT image reconstructions and MIPs were obtained to evaluate the vascular anatomy. RADIATION DOSE REDUCTION: This exam was performed according to the departmental dose-optimization program which includes automated exposure control, adjustment of the mA and/or kV according to patient size and/or use of iterative reconstruction technique. CONTRAST:  80mL OMNIPAQUE IOHEXOL 350 MG/ML SOLN COMPARISON:  Radiographs 12/05/2022 FINDINGS: Cardiovascular: No filling defect is identified in the pulmonary arterial tree to suggest  pulmonary embolus. Mild cardiomegaly. Mediastinum/Nodes: No pathologic adenopathy. Lungs/Pleura: Moderate-sized bilateral pleural effusions with passive atelectasis. Secondary pulmonary lobular septal thickening especially in the lung apices, with mosaic attenuation suspicious for congestive heart failure. However, there is also scattered subsolid nodularity and near solid nodularity in the lungs favoring the upper lobes, and atypical infectious process is not excluded. Bilateral airway thickening is present. Upper Abdomen: Suspected benign steatosis posteriorly in segment 4 of the liver for example on image 152 of series 4 Exophytic 1.7 by 1.4 cm lesion along the right kidney upper pole, internal density 21 Hounsfield units. Appearance favors a benign Bosniak category 2  cyst. No further imaging workup of this lesion is indicated. Musculoskeletal: Lower cervical plate and screw fixator. Review of the MIP images confirms the above findings. IMPRESSION: 1. No filling defect is identified in the pulmonary arterial tree to suggest pulmonary embolus. 2. Moderate-sized bilateral pleural effusions with passive atelectasis. 3. Pulmonary lobular septal thickening especially in the lung apices, with mosaic attenuation suspicious for congestive heart failure. 4. However, there is also scattered subsolid nodularity and near solid nodularity in the lungs favoring the upper lobes, and atypical infectious process is not excluded. 5. Bilateral airway thickening. Electronically Signed   By: Gaylyn Rong M.D.   On: 12/05/2022 19:08   DG Chest 2 View  Result Date: 12/05/2022 CLINICAL DATA:  Shortness of breath. Heart racing since beginning of week. EXAM: CHEST - 2 VIEW COMPARISON:  None Available. FINDINGS: Cardiac silhouette and mediastinal contours are within limits. Moderate bilateral interstitial thickening. Minimal blunting of the left costophrenic angle on frontal view. Minimal blunting of the posterior costophrenic  angles on lateral view no pneumothorax. Mild multilevel degenerative disc changes of the thoracic spine. ACDF hardware overlies the lower cervical spine. IMPRESSION: 1. Moderate bilateral interstitial thickening, which may represent pulmonary edema or atypical infection. 2. Minimal blunting of the posterior costophrenic angles on lateral view, which may represent trace pleural effusions. Electronically Signed   By: Neita Garnet M.D.   On: 12/05/2022 17:09    EKG: My personal interpretation of EKG shows: EKG showing sinus tachycardia heart rate 129.  And premature ventricular complex.  Nonspecific ST-T wave abnormal     Assessment/Plan: Principal Problem:   New onset of congestive heart failure (HCC) Active Problems:   Alcohol abuse   Generalized anxiety disorder   Essential hypertension   Transaminitis   Bronchitis   CAP (community acquired pneumonia)   Elevated troponin   Alcohol withdrawal (HCC)    Assessment and Plan: New onset of CHF exacerbation -Patient presenting with worsening shortness of breath for last few days, orthopnea, dyspnea and paroxysmal nocturnal dyspnea.  Denies any bilateral lower extremity swelling. - At ED initial presentation heart rate 134, elevated blood pressure 182/114.  EKG showing sinus tachycardia heart rate 129. - Chest x-ray showing evidence of atypical infection and per pulmonary edema and pleural effusion. -CTA chest rule out PE however it showed evidence of pulmonary vascular congestion, bilateral pleural effusion and atypical infectious process. -In the ED patient got Lasix to 40 mg IV 1 dose. - Continue Lasix 20 mg IV twice daily. - Obtain echocardiogram -Checking BNP level - Monitor volume status, strict I's/O and daily weight. - Continue cardiac monitoring  Community-acquired pneumonia -Chest x-ray and CTA chest showed evidence of atypical pneumonia.  Patient reported recent episodes of bronchitis and taking azithromycin at home without any  improvement of chest congestion.  No leukocytosis or patient is afebrile. - Checking Pro-Cal level, blood cultures, sputum cultures, respiratory panel, urine Legionella and urine strep antigen. - Initiating IV ceftriaxone and doxycycline.  With culture results and Pro-Cal level to decide continue antibiotic or not versus continue symptomatic treatment only. - Continue Xopenex as needed - Continue will follow Mucinex - Continue pulmonary toiletry, aspiration precaution, flutter valve, spirometry, and elevate head of the bed above 30 degree. -Continue monitor fever curve and WBC  Sinus tachycardia -Patient reported history of sinus tachycardia and she has been recently started on metoprolol just took 1 dose earlier today. - EKG showing sinus tachycardia 129 and premature ventricular complex. -TSH within normal range.  Checking  UDS.  Reflex sinus tachycardia in the setting of alcohol withdrawal as well. - Initiating Lopressor 12.5 mg twice daily. - Continue labetalol as needed. -Continue cardiac monitoring  Essential hypertension Elevated blood pressure -Reported history of essential hypertension and recently being started on metoprolol for management of hypertension and chronic sinus tachycardia. -In the ED blood pressure was 182/114.  Have not received any blood pressure regimen yet.  Elevated blood pressure likely secondary from alcohol withdrawal as well. -Continue cardiac monitoring. - Starting Lopressor 12.5 mg daily - Initiating labetalol as needed.  Transaminitis -Slight elevated AST/ALT 69/45.Marland Kitchen  Patient ported history of chronic alcohol use.  Continue to monitor liver function.  Acute alcohol withdrawal - Chronic alcohol use.  Last drink 10/24.  Denies any previous alcohol withdrawal related seizure in the past. - Continue Librium scheduled and Ativan as needed. - Continue CIWA protocol. -Folic acid, thiamine and multivitamin.  Elevated troponin secondary to demand ischemia  from tachycardia -Troponin x 225.  Elevated troponin in the setting of tachycardia which causing demand ischemia.  Patient denies any chest pain.  Unremarkable for any evidence of ST-T wave abnormality. ACS ruled out.  Non anion gap metabolic acidosis - Start low bicarb 21.  Continue to monitor  DVT prophylaxis:  Lovenox Code Status:  Full Code Diet: Heart healthy diet Family Communication: Patient's family member at the bedside updated Disposition Plan: Pending clinical improvement.  Tentative discharge to home next 2 days. Consults: Transition care team Admission status:   Observation, Telemetry bed  Severity of Illness: The appropriate patient status for this patient is OBSERVATION. Observation status is judged to be reasonable and necessary in order to provide the required intensity of service to ensure the patient's safety. The patient's presenting symptoms, physical exam findings, and initial radiographic and laboratory data in the context of their medical condition is felt to place them at decreased risk for further clinical deterioration. Furthermore, it is anticipated that the patient will be medically stable for discharge from the hospital within 2 midnights of admission.     Tereasa Coop, MD [facility]  How to contact the [facility] Attending or Consulting provider 7A - 7P or covering provider during after hours 7P -7A, for this patient.  Check the care team in [facility] and look for a) attending/consulting [facility] provider listed and b) the [facility] team listed Log into [email or web address] and use [place]'s universal password to access. If you do not have the password, please contact the hospital operator. Locate the [facility] provider you are looking for under [facility] and page to a number that you can be directly reached. If you still have difficulty reaching the provider, please page the [facility] (Director on Call) for the Hospitalists listed on amion for assistance.  12/05/2022, 11:43  PM

## 2022-12-06 ENCOUNTER — Observation Stay (HOSPITAL_COMMUNITY): Payer: 59

## 2022-12-06 DIAGNOSIS — I5021 Acute systolic (congestive) heart failure: Secondary | ICD-10-CM

## 2022-12-06 DIAGNOSIS — Z87891 Personal history of nicotine dependence: Secondary | ICD-10-CM | POA: Diagnosis not present

## 2022-12-06 DIAGNOSIS — I110 Hypertensive heart disease with heart failure: Secondary | ICD-10-CM | POA: Diagnosis not present

## 2022-12-06 DIAGNOSIS — J9601 Acute respiratory failure with hypoxia: Secondary | ICD-10-CM | POA: Diagnosis not present

## 2022-12-06 DIAGNOSIS — K5900 Constipation, unspecified: Secondary | ICD-10-CM | POA: Diagnosis not present

## 2022-12-06 DIAGNOSIS — I426 Alcoholic cardiomyopathy: Secondary | ICD-10-CM | POA: Diagnosis not present

## 2022-12-06 DIAGNOSIS — Z888 Allergy status to other drugs, medicaments and biological substances status: Secondary | ICD-10-CM | POA: Diagnosis not present

## 2022-12-06 DIAGNOSIS — N179 Acute kidney failure, unspecified: Secondary | ICD-10-CM | POA: Diagnosis not present

## 2022-12-06 DIAGNOSIS — I2489 Other forms of acute ischemic heart disease: Secondary | ICD-10-CM | POA: Diagnosis not present

## 2022-12-06 DIAGNOSIS — R739 Hyperglycemia, unspecified: Secondary | ICD-10-CM | POA: Diagnosis not present

## 2022-12-06 DIAGNOSIS — F10139 Alcohol abuse with withdrawal, unspecified: Secondary | ICD-10-CM | POA: Diagnosis not present

## 2022-12-06 DIAGNOSIS — Z7984 Long term (current) use of oral hypoglycemic drugs: Secondary | ICD-10-CM | POA: Diagnosis not present

## 2022-12-06 DIAGNOSIS — F411 Generalized anxiety disorder: Secondary | ICD-10-CM | POA: Diagnosis not present

## 2022-12-06 DIAGNOSIS — Z79899 Other long term (current) drug therapy: Secondary | ICD-10-CM | POA: Diagnosis not present

## 2022-12-06 DIAGNOSIS — J189 Pneumonia, unspecified organism: Secondary | ICD-10-CM | POA: Diagnosis not present

## 2022-12-06 DIAGNOSIS — E876 Hypokalemia: Secondary | ICD-10-CM | POA: Diagnosis not present

## 2022-12-06 DIAGNOSIS — Z8249 Family history of ischemic heart disease and other diseases of the circulatory system: Secondary | ICD-10-CM | POA: Diagnosis not present

## 2022-12-06 DIAGNOSIS — R000 Tachycardia, unspecified: Secondary | ICD-10-CM | POA: Diagnosis not present

## 2022-12-06 DIAGNOSIS — I509 Heart failure, unspecified: Secondary | ICD-10-CM | POA: Diagnosis not present

## 2022-12-06 DIAGNOSIS — I493 Ventricular premature depolarization: Secondary | ICD-10-CM | POA: Diagnosis not present

## 2022-12-06 DIAGNOSIS — Z1152 Encounter for screening for COVID-19: Secondary | ICD-10-CM | POA: Diagnosis not present

## 2022-12-06 DIAGNOSIS — E872 Acidosis, unspecified: Secondary | ICD-10-CM | POA: Diagnosis not present

## 2022-12-06 DIAGNOSIS — I428 Other cardiomyopathies: Secondary | ICD-10-CM | POA: Diagnosis not present

## 2022-12-06 DIAGNOSIS — I959 Hypotension, unspecified: Secondary | ICD-10-CM | POA: Diagnosis not present

## 2022-12-06 DIAGNOSIS — I340 Nonrheumatic mitral (valve) insufficiency: Secondary | ICD-10-CM | POA: Diagnosis not present

## 2022-12-06 LAB — ECHOCARDIOGRAM COMPLETE
AR max vel: 2.98 cm2
AV Area VTI: 3.05 cm2
AV Area mean vel: 2.72 cm2
AV Mean grad: 2 mm[Hg]
AV Peak grad: 3.5 mm[Hg]
Ao pk vel: 0.93 m/s
Area-P 1/2: 6.54 cm2
Height: 64 in
MV M vel: 5.1 m/s
MV Peak grad: 104 mm[Hg]
Radius: 0.4 cm
S' Lateral: 4.7 cm
Weight: 2724.8 [oz_av]

## 2022-12-06 LAB — COMPREHENSIVE METABOLIC PANEL
ALT: 38 U/L (ref 0–44)
AST: 44 U/L — ABNORMAL HIGH (ref 15–41)
Albumin: 3.4 g/dL — ABNORMAL LOW (ref 3.5–5.0)
Alkaline Phosphatase: 66 U/L (ref 38–126)
Anion gap: 12 (ref 5–15)
BUN: 9 mg/dL (ref 6–20)
CO2: 23 mmol/L (ref 22–32)
Calcium: 8.8 mg/dL — ABNORMAL LOW (ref 8.9–10.3)
Chloride: 102 mmol/L (ref 98–111)
Creatinine, Ser: 0.84 mg/dL (ref 0.44–1.00)
GFR, Estimated: >60 mL/min (ref >60)
Glucose, Bld: 119 mg/dL — ABNORMAL HIGH (ref 70–99)
Potassium: 3.2 mmol/L — ABNORMAL LOW (ref 3.5–5.1)
Sodium: 137 mmol/L (ref 135–145)
Total Bilirubin: 1 mg/dL (ref 0.3–1.2)
Total Protein: 6.2 g/dL — ABNORMAL LOW (ref 6.5–8.1)

## 2022-12-06 LAB — RESPIRATORY PANEL BY PCR

## 2022-12-06 LAB — BRAIN NATRIURETIC PEPTIDE: B Natriuretic Peptide: 1015.6 pg/mL — ABNORMAL HIGH (ref 0.0–100.0)

## 2022-12-06 LAB — CBC
HCT: 39.3 % (ref 36.0–46.0)
Hemoglobin: 13.5 g/dL (ref 12.0–15.0)
MCH: 33.3 pg (ref 26.0–34.0)
MCHC: 34.4 g/dL (ref 30.0–36.0)
MCV: 97 fL (ref 80.0–100.0)
Platelets: 329 10*3/uL (ref 150–400)
RBC: 4.05 MIL/uL (ref 3.87–5.11)
RDW: 11.9 % (ref 11.5–15.5)
WBC: 8.3 10*3/uL (ref 4.0–10.5)
nRBC: 0 % (ref 0.0–0.2)

## 2022-12-06 LAB — HIV ANTIBODY (ROUTINE TESTING W REFLEX): HIV Screen 4th Generation wRfx: NONREACTIVE

## 2022-12-06 LAB — PROCALCITONIN: Procalcitonin: <0.1 ng/mL

## 2022-12-06 LAB — MRSA NEXT GEN BY PCR, NASAL: MRSA by PCR Next Gen: NOT DETECTED

## 2022-12-06 LAB — ETHANOL: Alcohol, Ethyl (B): <10 mg/dL (ref <10)

## 2022-12-06 MED ORDER — EMPAGLIFLOZIN 10 MG PO TABS
10.0000 mg | ORAL_TABLET | Freq: Every day | ORAL | Status: DC
  Administered 2022-12-06 – 2022-12-09 (×4): 10 mg via ORAL
  Filled 2022-12-06 (×4): qty 1

## 2022-12-06 MED ORDER — FUROSEMIDE 10 MG/ML IJ SOLN
40.0000 mg | Freq: Two times a day (BID) | INTRAMUSCULAR | Status: DC
Start: 2022-12-06 — End: 2022-12-07
  Administered 2022-12-06 – 2022-12-07 (×3): 40 mg via INTRAVENOUS
  Filled 2022-12-06 (×3): qty 4

## 2022-12-06 MED ORDER — DIAZEPAM 2 MG PO TABS: 5.0000 mg | ORAL_TABLET | Freq: Two times a day (BID) | ORAL | Status: DC

## 2022-12-06 MED ORDER — POTASSIUM CHLORIDE CRYS ER 20 MEQ PO TBCR
40.0000 meq | EXTENDED_RELEASE_TABLET | Freq: Two times a day (BID) | ORAL | Status: AC
  Administered 2022-12-06 (×2): 40 meq via ORAL
  Filled 2022-12-06 (×2): qty 2

## 2022-12-06 NOTE — Progress Notes (Addendum)
PROGRESS NOTE    Hannah Bullock  UJW:[id number] DOB: 10/19/1975 DOA: 12/05/2022 PCP: Leilani Able, MD  47/F, former smoker, bartender, daily EtOH use presented to the ED with shortness of breath, palpitations, orthopnea, symptoms ongoing for few weeks, worse last few days. 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 -Clinically volume overloaded, continue IV Lasix today, add Jardiance -Follow-up echo -Counseled regarding EtOH cessation, also check TSH -Clinic today do not suspect pneumonia discontinue antibiotics and monitor -Check mag level  Sinus tachycardia -Multifactorial secondary to above and low-grade withdrawal, started on low-dose Lopressor overnight, continue today  EtOH abuse with mild withdrawal, flushing and tachycardia noted -Continue Librium protocol, thiamine, multivitamins Counseled regarding EtOH cessation  Elevated troponin likely from demand ischemia and tachycardia, monitor -Follow-up echo  Hypokalemia Replace  DVT prophylaxis: Lovenox Code Status: Full code Family Communication: None present Disposition Plan: Home pending above workup and clinical improvement  Consultants:    Procedures:   Antimicrobials:    Objective: Vitals:   12/06/22 0003 12/06/22 0428 12/06/22 0803 12/06/22 1117  BP: (!) 154/99 (!) 139/102 (!) 152/109 (!) 140/102  Pulse: (!) 106 (!) 114  (!) 102  Resp: (!) 26 (!) 24  20  Temp:  98.4 F (36.9 C) 97.6 F (36.4 C) 97.7 F (36.5 C)  TempSrc:  Oral Oral Oral  SpO2: 94% 92%  97%  Weight:  77.2 kg    Height:        Intake/Output Summary (Last 24 hours) at 12/06/2022 1155 Last data filed at 12/06/2022  0804 Gross per 24 hour  Intake 1201.62 ml  Output --  Net 1201.62 ml   Filed Weights   12/05/22 1539 12/05/22 2244 12/06/22 0428  Weight: 77.1 kg 77.7 kg 77.2 kg    Examination:  General exam: Appears calm and comfortable  Respiratory system: Clear to auscultation Cardiovascular system: S1 & S2 heard, RRR.  Abd: nondistended, soft and nontender.Normal bowel sounds heard. Central nervous system: Alert and oriented. No focal neurological deficits. Extremities: no edema Skin: No rashes Psychiatry:  Mood & affect appropriate.     Data Reviewed:   CBC: Recent Labs  Lab 12/05/22 1541 12/06/22 0843  WBC 8.8 8.3  NEUTROABS 7.0  --   HGB 13.6 13.5  HCT 38.6 39.3  MCV 97.0 97.0  PLT 335 329   Basic Metabolic Panel: Recent Labs  Lab 12/05/22 1541 12/06/22 0843  NA 140 137  K 4.0 3.2*  CL 107 102  CO2 21* 23  GLUCOSE 158* 119*  BUN 7 9  CREATININE 0.74 0.84  CALCIUM 9.4 8.8*   GFR: Estimated Creatinine Clearance: 84.2 mL/min (by C-G formula based on SCr of 0.84 mg/dL). Liver Function Tests: Recent Labs  Lab 12/05/22 1541 12/06/22 0843  AST 69* 44*  ALT 45* 38  ALKPHOS 62 66  BILITOT 0.7 1.0  PROT 6.8 6.2*  ALBUMIN 4.3 3.4*   No results for input(s): "LIPASE", "AMYLASE" in the last 168 hours. No results for input(s): "AMMONIA" in the last 168 hours. Coagulation Profile: No results for input(s): "INR", "PROTIME" in the last 168 hours. Cardiac Enzymes: No results for input(s): "CKTOTAL", "CKMB", "CKMBINDEX", "TROPONINI" in the last 168 hours. BNP (last 3 results) No results for input(s): "  PROBNP" in the last 8760 hours. HbA1C: No results for input(s): "HGBA1C" in the last 72 hours. CBG: No results for input(s): "GLUCAP" in the last 168 hours. Lipid Profile: No results for input(s): "CHOL", "HDL", "LDLCALC", "TRIG", "CHOLHDL", "LDLDIRECT" in the last 72 hours. Thyroid Function Tests: Recent Labs    12/05/22 1638  TSH 2.000   Anemia Panel: No  results for input(s): "VITAMINB12", "FOLATE", "FERRITIN", "TIBC", "IRON", "RETICCTPCT" in the last 72 hours. Urine analysis:    Component Value Date/Time   COLORURINE YELLOW 12/05/2022 1541   APPEARANCEUR CLEAR 12/05/2022 1541   LABSPEC 1.017 12/05/2022 1541   PHURINE 5.5 12/05/2022 1541   GLUCOSEU NEGATIVE 12/05/2022 1541   HGBUR LARGE (A) 12/05/2022 1541   BILIRUBINUR NEGATIVE 12/05/2022 1541   KETONESUR 15 (A) 12/05/2022 1541   PROTEINUR TRACE (A) 12/05/2022 1541   NITRITE NEGATIVE 12/05/2022 1541   LEUKOCYTESUR NEGATIVE 12/05/2022 1541   Sepsis Labs: @LABRCNTIP (procalcitonin:4,lacticidven:4)  ) Recent Results (from the past 240 hour(s))  SARS Coronavirus 2 by RT PCR (hospital order, performed in [facility] hospital lab) *cepheid single result test* Anterior Nasal Swab     Status: None   Collection Time: 12/05/22  4:38 PM   Specimen: Anterior Nasal Swab  Result Value Ref Range Status   SARS Coronavirus 2 by RT PCR NEGATIVE NEGATIVE Final    Comment: (NOTE) SARS-CoV-2 target nucleic acids are NOT DETECTED.  The SARS-CoV-2 RNA is generally detectable in upper and lower respiratory specimens during the acute phase of infection. The lowest concentration of SARS-CoV-2 viral copies this assay can detect is 250 copies / mL. A negative result does not preclude SARS-CoV-2 infection and should not be used as the sole basis for treatment or other patient management decisions.  A negative result may occur with improper specimen collection / handling, submission of specimen other than nasopharyngeal swab, presence of viral mutation(s) within the areas targeted by this assay, and inadequate number of viral copies (<250 copies / mL). A negative result must be combined with clinical observations, patient history, and epidemiological information.  Fact Sheet for Patients:   RoadLapTop.co.za  Fact Sheet for Healthcare  Providers: http://kim-miller.com/  This test is not yet approved or  cleared by the [place] FDA and has been authorized for detection and/or diagnosis of SARS-CoV-2 by FDA under an Emergency Use Authorization (EUA).  This EUA will remain in effect (meaning this test can be used) for the duration of the COVID-19 declaration under Section 564(b)(1) of the Act, 21 U.S.C. section 360bbb-3(b)(1), unless the authorization is terminated or revoked sooner.  Performed at [facility], [address] [place], [place] [address]   MRSA Next Gen by PCR, Nasal     Status: None   Collection Time: 12/05/22 10:57 PM   Specimen: Nasal Mucosa; Nasal Swab  Result Value Ref Range Status   MRSA by PCR Next Gen NOT DETECTED NOT DETECTED Final    Comment: (NOTE) The GeneXpert MRSA Assay (FDA approved for NASAL specimens only), is one component of a comprehensive MRSA colonization surveillance program. It is not intended to diagnose MRSA infection nor to guide or monitor treatment for MRSA infections. Test performance is not FDA approved in patients less than 38 years old. Performed at [facility], [address], [place], [place] [address]   Respiratory (~20 pathogens) panel by PCR     Status: None   Collection Time: 12/05/22 11:12 PM   Specimen: Nasopharyngeal Swab; Respiratory  Result Value Ref Range Status  Adenovirus NOT DETECTED NOT DETECTED Final   Coronavirus 229E NOT DETECTED NOT DETECTED Final    Comment: (NOTE) The Coronavirus on the Respiratory Panel, DOES NOT test for the novel  Coronavirus (2019 nCoV)    Coronavirus HKU1 NOT DETECTED NOT DETECTED Final   Coronavirus NL63 NOT DETECTED NOT DETECTED Final   Coronavirus OC43 NOT DETECTED NOT DETECTED Final   Metapneumovirus NOT DETECTED NOT DETECTED Final   Rhinovirus / Enterovirus NOT DETECTED NOT DETECTED Final   Influenza A NOT DETECTED NOT DETECTED Final   Influenza B NOT DETECTED  NOT DETECTED Final   Parainfluenza Virus 1 NOT DETECTED NOT DETECTED Final   Parainfluenza Virus 2 NOT DETECTED NOT DETECTED Final   Parainfluenza Virus 3 NOT DETECTED NOT DETECTED Final   Parainfluenza Virus 4 NOT DETECTED NOT DETECTED Final   Respiratory Syncytial Virus NOT DETECTED NOT DETECTED Final   Bordetella pertussis NOT DETECTED NOT DETECTED Final   Bordetella Parapertussis NOT DETECTED NOT DETECTED Final   Chlamydophila pneumoniae NOT DETECTED NOT DETECTED Final   Mycoplasma pneumoniae NOT DETECTED NOT DETECTED Final    Comment: Performed at [facility], [address] [place], [place] [address]  Culture, blood (Routine X 2) w Reflex to ID Panel     Status: None (Preliminary result)   Collection Time: 12/05/22 11:37 PM   Specimen: BLOOD  Result Value Ref Range Status   Specimen Description BLOOD BLOOD LEFT ARM  Final   Special Requests   Final    BOTTLES DRAWN AEROBIC AND ANAEROBIC Blood Culture adequate volume   Culture   Final    NO GROWTH < 12 HOURS Performed at [facility], [address], [place], [place] [address]    Report Status PENDING  Incomplete  Culture, blood (Routine X 2) w Reflex to ID Panel     Status: None (Preliminary result)   Collection Time: 12/05/22 11:37 PM   Specimen: BLOOD  Result Value Ref Range Status   Specimen Description BLOOD BLOOD LEFT ARM  Final   Special Requests   Final    BOTTLES DRAWN AEROBIC AND ANAEROBIC Blood Culture adequate volume   Culture   Final    NO GROWTH < 12 HOURS Performed at [facility], [address], [place], [place] [address]    Report Status PENDING  Incomplete     Radiology Studies: CT Angio Chest PE W/Cm &/Or Wo Cm  Result Date: 12/05/2022 CLINICAL DATA:  New shortness of breath and tachycardia. Recovering from a respiratory infection. EXAM: CT ANGIOGRAPHY CHEST WITH CONTRAST TECHNIQUE: Multidetector CT imaging of the chest was performed using the standard protocol during bolus  administration of intravenous contrast. Multiplanar CT image reconstructions and MIPs were obtained to evaluate the vascular anatomy. RADIATION DOSE REDUCTION: This exam was performed according to the departmental dose-optimization program which includes automated exposure control, adjustment of the mA and/or kV according to patient size and/or use of iterative reconstruction technique. CONTRAST:  80mL OMNIPAQUE IOHEXOL 350 MG/ML SOLN COMPARISON:  Radiographs 12/05/2022 FINDINGS: Cardiovascular: No filling defect is identified in the pulmonary arterial tree to suggest pulmonary embolus. Mild cardiomegaly. Mediastinum/Nodes: No pathologic adenopathy. Lungs/Pleura: Moderate-sized bilateral pleural effusions with passive atelectasis. Secondary pulmonary lobular septal thickening especially in the lung apices, with mosaic attenuation suspicious for congestive heart failure. However, there is also scattered subsolid nodularity and near solid nodularity in the lungs favoring the upper lobes, and atypical infectious process is not excluded. Bilateral airway thickening is present. Upper  Abdomen: Suspected benign steatosis posteriorly in segment 4 of the liver for example on image 152 of series 4 Exophytic 1.7 by 1.4 cm lesion along the right kidney upper pole, internal density 21 Hounsfield units. Appearance favors a benign Bosniak category 2 cyst. No further imaging workup of this lesion is indicated. Musculoskeletal: Lower cervical plate and screw fixator. Review of the MIP images confirms the above findings. IMPRESSION: 1. No filling defect is identified in the pulmonary arterial tree to suggest pulmonary embolus. 2. Moderate-sized bilateral pleural effusions with passive atelectasis. 3. Pulmonary lobular septal thickening especially in the lung apices, with mosaic attenuation suspicious for congestive heart failure. 4. However, there is also scattered subsolid nodularity and near solid nodularity in the lungs favoring  the upper lobes, and atypical infectious process is not excluded. 5. Bilateral airway thickening. Electronically Signed   By: Gaylyn Rong M.D.   On: 12/05/2022 19:08   DG Chest 2 View  Result Date: 12/05/2022 CLINICAL DATA:  Shortness of breath. Heart racing since beginning of week. EXAM: CHEST - 2 VIEW COMPARISON:  None Available. FINDINGS: Cardiac silhouette and mediastinal contours are within limits. Moderate bilateral interstitial thickening. Minimal blunting of the left costophrenic angle on frontal view. Minimal blunting of the posterior costophrenic angles on lateral view no pneumothorax. Mild multilevel degenerative disc changes of the thoracic spine. ACDF hardware overlies the lower cervical spine. IMPRESSION: 1. Moderate bilateral interstitial thickening, which may represent pulmonary edema or atypical infection. 2. Minimal blunting of the posterior costophrenic angles on lateral view, which may represent trace pleural effusions. Electronically Signed   By: Neita Garnet M.D.   On: 12/05/2022 17:09     Scheduled Meds:  chlordiazePOXIDE  25 mg Oral QID   Followed by   chlordiazePOXIDE  25 mg Oral TID   Followed by   Melene Muller ON 12/07/2022] chlordiazePOXIDE  25 mg Oral BH-qamhs   Followed by   Melene Muller ON 12/08/2022] chlordiazePOXIDE  25 mg Oral Daily   empagliflozin  10 mg Oral Daily   enoxaparin (LOVENOX) injection  40 mg Subcutaneous Q24H   folic acid  1 mg Oral Daily   furosemide  40 mg Intravenous BID   guaiFENesin  600 mg Oral BID   metoprolol tartrate  12.5 mg Oral BID   multivitamin with minerals  1 tablet Oral Daily   thiamine  100 mg Oral Daily   Or   thiamine  100 mg Intravenous Daily   Continuous Infusions:  doxycycline (VIBRAMYCIN) IV 100 mg (12/06/22 0002)     LOS: 0 days    Time spent:    Zannie Cove, MD [facility]   12/06/2022, 11:55 AM

## 2022-12-07 DIAGNOSIS — I509 Heart failure, unspecified: Secondary | ICD-10-CM | POA: Diagnosis not present

## 2022-12-07 DIAGNOSIS — I5021 Acute systolic (congestive) heart failure: Secondary | ICD-10-CM

## 2022-12-07 LAB — CBC
HCT: 41.8 % (ref 36.0–46.0)
Hemoglobin: 14.5 g/dL (ref 12.0–15.0)
MCH: 33.9 pg (ref 26.0–34.0)
MCHC: 34.7 g/dL (ref 30.0–36.0)
MCV: 97.7 fL (ref 80.0–100.0)
Platelets: 354 10*3/uL (ref 150–400)
RBC: 4.28 MIL/uL (ref 3.87–5.11)
RDW: 11.8 % (ref 11.5–15.5)
WBC: 7 10*3/uL (ref 4.0–10.5)
nRBC: 0 % (ref 0.0–0.2)

## 2022-12-07 LAB — COMPREHENSIVE METABOLIC PANEL
ALT: 34 U/L (ref 0–44)
AST: 42 U/L — ABNORMAL HIGH (ref 15–41)
Albumin: 3.5 g/dL (ref 3.5–5.0)
Alkaline Phosphatase: 70 U/L (ref 38–126)
Anion gap: 14 (ref 5–15)
BUN: 13 mg/dL (ref 6–20)
CO2: 23 mmol/L (ref 22–32)
Calcium: 9.4 mg/dL (ref 8.9–10.3)
Chloride: 101 mmol/L (ref 98–111)
Creatinine, Ser: 0.97 mg/dL (ref 0.44–1.00)
GFR, Estimated: >60 mL/min (ref >60)
Glucose, Bld: 176 mg/dL — ABNORMAL HIGH (ref 70–99)
Potassium: 3.4 mmol/L — ABNORMAL LOW (ref 3.5–5.1)
Sodium: 138 mmol/L (ref 135–145)
Total Bilirubin: 0.8 mg/dL (ref 0.3–1.2)
Total Protein: 6.4 g/dL — ABNORMAL LOW (ref 6.5–8.1)

## 2022-12-07 LAB — HEMOGLOBIN A1C
Hgb A1c MFr Bld: 5.1 % (ref 4.8–5.6)
Mean Plasma Glucose: 99.67 mg/dL

## 2022-12-07 LAB — MAGNESIUM: Magnesium: 1.9 mg/dL (ref 1.7–2.4)

## 2022-12-07 LAB — T4, FREE: Free T4: 0.86 ng/dL (ref 0.61–1.12)

## 2022-12-07 LAB — TSH: TSH: 3.466 u[IU]/mL (ref 0.350–4.500)

## 2022-12-07 MED ORDER — SODIUM CHLORIDE 0.9 % IV SOLN: INTRAVENOUS | Status: DC

## 2022-12-07 MED ORDER — SACUBITRIL-VALSARTAN 24-26 MG PO TABS
1.0000 | ORAL_TABLET | Freq: Two times a day (BID) | ORAL | Status: DC
  Administered 2022-12-07 (×2): 1 via ORAL
  Filled 2022-12-07 (×2): qty 1

## 2022-12-07 MED ORDER — FUROSEMIDE 10 MG/ML IJ SOLN
40.0000 mg | Freq: Two times a day (BID) | INTRAMUSCULAR | Status: DC
  Administered 2022-12-07 (×2): 40 mg via INTRAVENOUS
  Filled 2022-12-07 (×2): qty 4

## 2022-12-07 MED ORDER — SPIRONOLACTONE 12.5 MG HALF TABLET
12.5000 mg | ORAL_TABLET | Freq: Every day | ORAL | Status: DC
  Administered 2022-12-07 – 2022-12-09 (×3): 12.5 mg via ORAL
  Filled 2022-12-07 (×3): qty 1

## 2022-12-07 MED ORDER — POTASSIUM CHLORIDE CRYS ER 20 MEQ PO TBCR
40.0000 meq | EXTENDED_RELEASE_TABLET | Freq: Two times a day (BID) | ORAL | Status: AC
  Administered 2022-12-07 (×2): 40 meq via ORAL
  Filled 2022-12-07 (×2): qty 2

## 2022-12-07 MED ORDER — ASPIRIN 81 MG PO CHEW
81.0000 mg | CHEWABLE_TABLET | ORAL | Status: AC
  Administered 2022-12-08: 81 mg via ORAL
  Filled 2022-12-07: qty 1

## 2022-12-07 MED ORDER — FUROSEMIDE 10 MG/ML IJ SOLN: 40.0000 mg | Freq: Once | INTRAMUSCULAR | Status: DC

## 2022-12-07 MED ORDER — PNEUMOCOCCAL 20-VAL CONJ VACC 0.5 ML IM SUSY
0.5000 mL | PREFILLED_SYRINGE | INTRAMUSCULAR | Status: DC
  Filled 2022-12-07: qty 0.5

## 2022-12-07 MED ORDER — INFLUENZA VIRUS VACC SPLIT PF (FLUZONE) 0.5 ML IM SUSY: 0.5000 mL | PREFILLED_SYRINGE | INTRAMUSCULAR | Status: DC

## 2022-12-07 MED ORDER — CARVEDILOL 6.25 MG PO TABS
6.2500 mg | ORAL_TABLET | Freq: Two times a day (BID) | ORAL | Status: DC
  Administered 2022-12-07: 6.25 mg via ORAL
  Filled 2022-12-07 (×2): qty 1

## 2022-12-07 MED ORDER — CARVEDILOL 6.25 MG PO TABS: 6.2500 mg | ORAL_TABLET | Freq: Two times a day (BID) | ORAL | Status: DC

## 2022-12-07 NOTE — Consult Note (Signed)
Cardiology Consultation   Patient ID: Hannah Bullock MRN: [id number]; DOB: May 27, 1975  Admit date: 12/05/2022 Date of Consult: 12/07/2022  PCP:  Hannah Able, MD   [facility] Providers Cardiologist: New to [facility]   Patient Profile:   Hannah Bullock is a 47 y.o. female with a hx of anxiety, allergies and alcohol use who is being seen 12/07/2022 for the evaluation of new CHF at the request of Dr. Jomarie Longs.  History of Present Illness:   Ms. Donchez presented to [facility] ED on 12/05/2022 for evaluation of worsening shortness of breath and palpitations.   Initial labs showed WBC 8.8, Hgb 13.6, platelets 335, Na+ 140, K+ 4.9 creatinine 0.74. AST 69. ALT 45. Lactic acid normal at 1.5. BNP elevated to 662 on admission but repeat at 1015. Initial and repeat Hs Troponin values at 25. Negative for COVID and Influenza. CXR showed moderate bilateral thickening which may represent edema or infection. CTA showed no evidence of a PE but was noted to have moderate-sized bilateral pleural effusions with atelectasis and pulmonary lobular septal thickening with mosaic attenuation concerning for CHF.  EKG showed sinus tachycardia, heart rate 129 with PVC's and T-wave flattening along inferior leads.  She was admitted for further management of CHF and received IV Lasix 40 mg while in the ED and was started on 20 mg twice daily. Was also started on antibiotic therapy given concerns for CAP. Procalcitonin was less than 0.10 and antibiotic therapy was discontinued yesterday. Was also placed on CIWA protocol given alcohol abuse.  Echocardiogram yesterday did show a reduced EF of 25 to 30% with global hypokinesis. RV function normal and noted to have mild to moderate MR. Lasix was titrated to 40 mg twice daily and she was started on Jardiance 10 mg daily. I&O's not fully recorded but listed as +441 mL. Weight at 171 lbs on admission and down to 166 lbs today (reports baseline ~ 160 lbs).   In  talking with the patient today, she reports worsening shortness of breath over the past several months and she does not have a PCP but was evaluated by telehealth providers and at Urgent Care centers and prescribed antibiotics intermittently.  Reports shortness of breath with activity such as walking up inclines or stairs. She works as a Leisure centre manager and reports she can be on her feet throughout the evening and does not necessarily experience symptoms with this. Denies any specific chest pain.  Does report palpitations but felt like this was due to underlying anxiety. No recent abdominal distention or lower extremity edema. Does report intermittent PND. She is unaware any personal cardiac history but reports her father had an MI in his 60's.  She is a former smoker but quit many years ago. She does consume at least 5-6 Bourbons on a daily basis. Also reports consuming 2-3 caffeinated sodas a day.  Previously consumed energy drinks regularly but no recent use.  Past Medical History:  Diagnosis Date   Anxiety    Medical history non-contributory     Past Surgical History:  Procedure Laterality Date   ESOPHAGOGASTRODUODENOSCOPY N/A 04/06/2017   Procedure: ESOPHAGOGASTRODUODENOSCOPY (EGD);  Surgeon: Beverley Fiedler, MD;  Location: [facility] ENDOSCOPY;  Service: Gastroenterology;  Laterality: N/A;   KNEE SURGERY     NECK SURGERY       Home Medications:  Prior to Admission medications   Medication Sig Start Date End Date Taking? Authorizing Provider  albuterol (VENTOLIN HFA) 108 (90 Base) MCG/ACT inhaler Inhale 2 puffs into the  lungs every 4 (four) hours as needed. 12/03/22  Yes [provider]  amphetamine-dextroamphetamine (ADDERALL XR) 20 MG 24 hr capsule Take 20 mg by mouth every morning. 10/30/22  Yes [provider]  amphetamine-dextroamphetamine (ADDERALL) 10 MG tablet Take 10 mg by mouth daily. Patient takes on hectic days, when she goes to school and work 10/30/22  Yes [provider]  Cholecalciferol (VITAMIN D-3 PO) Take 1 capsule by mouth daily.   Yes [provider]  DHEA 25 MG CAPS Take 1 capsule by mouth daily.   Yes [provider]  estrogens, conjugated, (PREMARIN) 1.25 MG tablet Take 1.25 mg by mouth daily.   Yes [provider]  hydrOXYzine (ATARAX) 25 MG tablet Take 25-50 mg by mouth at bedtime. 11/05/22  Yes [provider]  ibuprofen (ADVIL) 200 MG tablet Take 800 mg by mouth 3 (three) times daily as needed for fever, headache or mild pain.   Yes [provider]  loratadine (CLARITIN) 10 MG tablet Take 10 mg by mouth daily.   Yes [provider]  metoprolol succinate (TOPROL-XL) 25 MG 24 hr tablet Take 25 mg by mouth 2 (two) times daily. 12/04/22  Yes [provider]  Phenylephrine-APAP-guaiFENesin (MUCINEX FAST-MAX PO) Take 20 mLs by mouth daily.   Yes [provider]  progesterone (PROMETRIUM) 100 MG capsule Take 100 mg by mouth daily.   Yes [provider]  traZODone (DESYREL) 50 MG tablet Take 50-100 mg by mouth at bedtime. Patient not taking: Reported on 12/06/2022 11/06/22   [provider]    Inpatient Medications: Scheduled Meds:  carvedilol  6.25 mg Oral BID WC   chlordiazePOXIDE  25 mg Oral TID   Followed by   chlordiazePOXIDE  25 mg Oral BH-qamhs   Followed by   Melene Muller ON 12/08/2022] chlordiazePOXIDE  25 mg Oral Daily   empagliflozin  10 mg Oral Daily   enoxaparin (LOVENOX) injection  40 mg Subcutaneous Q24H   folic acid  1 mg Oral Daily   furosemide  40 mg Intravenous BID   guaiFENesin  600 mg Oral BID   [START ON 12/09/2022] influenza vac split trivalent PF  0.5 mL Intramuscular Tomorrow-1000   multivitamin with minerals  1 tablet Oral Daily   [START ON 12/09/2022] pneumococcal 20-valent conjugate vaccine  0.5 mL Intramuscular Tomorrow-1000   potassium chloride  40 mEq Oral BID   thiamine  100 mg Oral Daily   Or   thiamine  100 mg Intravenous Daily    Continuous Infusions:  PRN Meds: acetaminophen **OR** acetaminophen, labetalol, levalbuterol, loperamide, LORazepam **OR** LORazepam, ondansetron, senna-docusate  Allergies:    Allergies  Allergen Reactions   Prednisone Rash    Social History:   Social History   Socioeconomic History   Marital status: Married    Spouse name: Not on file   Number of children: Not on file   Years of education: Not on file   Highest education level: Not on file  Occupational History   Not on file  Tobacco Use   Smoking status: Former    Current packs/day: 0.00    Types: Cigarettes    Quit date: 04/06/2016    Years since quitting: 6.6   Smokeless tobacco: Never  Vaping Use   Vaping status: Never Used  Substance and Sexual Activity   Alcohol use: Yes    Alcohol/week: 35.0 standard drinks of alcohol    Types: 35 Shots of liquor per week    Comment: 5 shots  of bourbon per day   Drug use: No   Sexual activity: Yes  Other Topics Concern   Not on file  Social History Narrative   Not on file   Social Determinants of Health   Financial Resource Strain: Not on file  Food Insecurity: No Food Insecurity (12/07/2022)   Hunger Vital Sign    Worried About Running Out of Food in the Last Year: Never true    Ran Out of Food in the Last Year: Never true  Transportation Needs: No Transportation Needs (12/07/2022)   PRAPARE - Administrator, Civil Service (Medical): No    Lack of Transportation (Non-Medical): No  Physical Activity: Not on file  Stress: Not on file  Social Connections: Not on file  Intimate Partner Violence: Not At Risk (12/07/2022)   Humiliation, Afraid, Rape, and Kick questionnaire    Fear of Current or Ex-Partner: No    Emotionally Abused: No    Physically Abused: No    Sexually Abused: No    Family History:    Family History  Problem Relation Age of Onset   Heart disease Father    Heart disease Paternal Grandfather      ROS:  Please see the history  of present illness.   All other ROS reviewed and negative.     Physical Exam/Data:   Vitals:   12/06/22 2347 12/07/22 0100 12/07/22 0325 12/07/22 0849  BP:  (!) 136/107 119/88 (!) 136/97  Pulse: (!) 109 (!) 112 (!) 112 99  Resp: 15 19 (!) 33 16  Temp:   98 F (36.7 C) (!) 97.5 F (36.4 C)  TempSrc:   Oral Oral  SpO2: 96% 92% 92% 93%  Weight:   75.3 kg   Height:        Intake/Output Summary (Last 24 hours) at 12/07/2022 1021 Last data filed at 12/07/2022 0328 Gross per 24 hour  Intake 240 ml  Output 1000 ml  Net -760 ml      12/07/2022    3:25 AM 12/06/2022    4:28 AM 12/05/2022   10:44 PM  Last 3 Weights  Weight (lbs) 166 lb 170 lb 4.8 oz 171 lb 4.8 oz  Weight (kg) 75.297 kg 77.248 kg 77.7 kg     Body mass index is 28.49 kg/m.  General:  Well nourished, well developed female appearing in no acute distress HEENT: normal Neck: no JVD Vascular: No carotid bruits; Distal pulses 2+ bilaterally Cardiac:  normal S1, S2; RRR; no murmur  Lungs: slightly decreased breath sounds along bases bilaterally.  Abd: soft, nontender, no hepatomegaly  Ext: no pitting edema Musculoskeletal:  No deformities, BUE and BLE strength normal and equal Skin: warm and dry  Neuro:  CNs 2-12 intact, no focal abnormalities noted Psych:  Normal affect   EKG:  The EKG was personally reviewed and demonstrates: Sinus tachycardia, heart rate 129 with PVC's and T-wave flattening along inferior leads.  Telemetry:  Telemetry was personally reviewed and demonstrates:  NSR with episodes of sinus tachycardia, HR in 90's to 110's. Occasional PVC's.   Relevant CV Studies:  Echocardiogram: 12/06/2022 IMPRESSIONS     1. Left ventricular ejection fraction, by estimation, is 25 to 30%. The  left ventricle has severely decreased function. The left ventricle  demonstrates global hypokinesis. The left ventricular internal cavity size  was mildly dilated. Indeterminate  diastolic filling due to E-A  fusion.   2. Right ventricular systolic function is normal. The right ventricular  size is  normal. Tricuspid regurgitation signal is inadequate for assessing  PA pressure.   3. The mitral valve is normal in structure. Mild to moderate mitral valve  regurgitation.   4. The aortic valve was not well visualized. Aortic valve regurgitation  is not visualized. No aortic stenosis is present.   5. The inferior vena cava is normal in size with greater than 50%  respiratory variability, suggesting right atrial pressure of 3 mmHg.    Laboratory Data:  High Sensitivity Troponin:   Recent Labs  Lab 12/05/22 1541 12/05/22 1839  TROPONINIHS 25* 25*     Chemistry Recent Labs  Lab 12/05/22 1541 12/06/22 0843 12/07/22 0210  NA 140 137 138  K 4.0 3.2* 3.4*  CL 107 102 101  CO2 21* 23 23  GLUCOSE 158* 119* 176*  BUN 7 9 13   CREATININE 0.74 0.84 0.97  CALCIUM 9.4 8.8* 9.4  MG  --   --  1.9  GFRNONAA >60 >60 >60  ANIONGAP 12 12 14     Recent Labs  Lab 12/05/22 1541 12/06/22 0843 12/07/22 0210  PROT 6.8 6.2* 6.4*  ALBUMIN 4.3 3.4* 3.5  AST 69* 44* 42*  ALT 45* 38 34  ALKPHOS 62 66 70  BILITOT 0.7 1.0 0.8   Lipids No results for input(s): "CHOL", "TRIG", "HDL", "LABVLDL", "LDLCALC", "CHOLHDL" in the last 168 hours.  Hematology Recent Labs  Lab 12/05/22 1541 12/06/22 0843 12/07/22 0210  WBC 8.8 8.3 7.0  RBC 3.98 4.05 4.28  HGB 13.6 13.5 14.5  HCT 38.6 39.3 41.8  MCV 97.0 97.0 97.7  MCH 34.2* 33.3 33.9  MCHC 35.2 34.4 34.7  RDW 11.9 11.9 11.8  PLT 335 329 354   Thyroid  Recent Labs  Lab 12/07/22 0210  TSH 3.466  FREET4 0.86    BNP Recent Labs  Lab 12/05/22 1638 12/05/22 2337  BNP 662.0* 1,015.6*    DDimer No results for input(s): "DDIMER" in the last 168 hours.   Radiology/Studies:   CT Angio Chest PE W/Cm &/Or Wo Cm  Result Date: 12/05/2022 CLINICAL DATA:  New shortness of breath and tachycardia. Recovering from a respiratory infection. EXAM: CT  ANGIOGRAPHY CHEST WITH CONTRAST TECHNIQUE: Multidetector CT imaging of the chest was performed using the standard protocol during bolus administration of intravenous contrast. Multiplanar CT image reconstructions and MIPs were obtained to evaluate the vascular anatomy. RADIATION DOSE REDUCTION: This exam was performed according to the departmental dose-optimization program which includes automated exposure control, adjustment of the mA and/or kV according to patient size and/or use of iterative reconstruction technique. CONTRAST:  80mL OMNIPAQUE IOHEXOL 350 MG/ML SOLN COMPARISON:  Radiographs 12/05/2022 FINDINGS: Cardiovascular: No filling defect is identified in the pulmonary arterial tree to suggest pulmonary embolus. Mild cardiomegaly. Mediastinum/Nodes: No pathologic adenopathy. Lungs/Pleura: Moderate-sized bilateral pleural effusions with passive atelectasis. Secondary pulmonary lobular septal thickening especially in the lung apices, with mosaic attenuation suspicious for congestive heart failure. However, there is also scattered subsolid nodularity and near solid nodularity in the lungs favoring the upper lobes, and atypical infectious process is not excluded. Bilateral airway thickening is present. Upper Abdomen: Suspected benign steatosis posteriorly in segment 4 of the liver for example on image 152 of series 4 Exophytic 1.7 by 1.4 cm lesion along the right kidney upper pole, internal density 21 Hounsfield units. Appearance favors a benign Bosniak category 2 cyst. No further imaging workup of this lesion is indicated. Musculoskeletal: Lower cervical plate and screw fixator. Review of the MIP images confirms the above findings.  IMPRESSION: 1. No filling defect is identified in the pulmonary arterial tree to suggest pulmonary embolus. 2. Moderate-sized bilateral pleural effusions with passive atelectasis. 3. Pulmonary lobular septal thickening especially in the lung apices, with mosaic attenuation suspicious  for congestive heart failure. 4. However, there is also scattered subsolid nodularity and near solid nodularity in the lungs favoring the upper lobes, and atypical infectious process is not excluded. 5. Bilateral airway thickening. Electronically Signed   By: Gaylyn Rong M.D.   On: 12/05/2022 19:08   DG Chest 2 View  Result Date: 12/05/2022 CLINICAL DATA:  Shortness of breath. Heart racing since beginning of week. EXAM: CHEST - 2 VIEW COMPARISON:  None Available. FINDINGS: Cardiac silhouette and mediastinal contours are within limits. Moderate bilateral interstitial thickening. Minimal blunting of the left costophrenic angle on frontal view. Minimal blunting of the posterior costophrenic angles on lateral view no pneumothorax. Mild multilevel degenerative disc changes of the thoracic spine. ACDF hardware overlies the lower cervical spine. IMPRESSION: 1. Moderate bilateral interstitial thickening, which may represent pulmonary edema or atypical infection. 2. Minimal blunting of the posterior costophrenic angles on lateral view, which may represent trace pleural effusions. Electronically Signed   By: Neita Garnet M.D.   On: 12/05/2022 17:09     Assessment and Plan:   1. Acute HFrEF - Echocardiogram this admission shows a reduced EF of 25 to 30% with global hypokinesis. 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 However, she does have a family history of CAD with her father having an MI in his 47's and she is a former tobacco user. Hemoglobin A1c at 5.1 this admission and will check an FLP for risk stratification.  - Would a [facility] tomorrow for definitive evaluation. Risks and benefits of this were reviewed with the patient and she is in agreement to proceed. Will review with MD but anticipate this could be performed tomorrow given improvement in her respiratory status. - She is currently receiving IV Lasix 40 mg twice daily and can likely switch to PO diuretics  tomorrow. She has been started on Coreg 6.25 mg twice daily and Jardiance 10 mg daily. Spironolactone 12.5mg  was added by the admitting team today. In discussion with Dr. Flora Lipps will add Sherryll Burger 24-26mg  BID as well. Will plan for a follow-up echocardiogram in several months for reassessment of her EF.  2. Mitral Valve Regurgitation - Mild to moderate by echocardiogram this admission. Continue to follow as an outpatient.  3.  Alcohol abuse - She reports consuming 5-6 bourbon drinks on a daily basis. She is currently on CIWA protocol. Cessation advised.  4. Elevated LFT's - AST was at 69 on admission with ALT at 45. Improving and at 42 and 34 respectively when rechecked today. Likely due to alcohol use. Hepatic congestion could have contributed as well.   5. Hypokalemia - K+ 3.4 this AM. Supplementation has already been ordered by the admitting team.   Risk Assessment/Risk Scores:    New York Heart Association (NYHA) Functional Class NYHA Class III  For questions or updates, please contact  [facility] Please consult [email or web address] for contact info under    Signed, Ellsworth Lennox, PA-C  12/07/2022 10:21 AM

## 2022-12-07 NOTE — Progress Notes (Signed)
PROGRESS NOTE    Hannah Bullock  WUJ:[id number] DOB: May 23, 1975 DOA: 12/05/2022 PCP: Leilani Able, MD  47/F, former smoker, bartender, daily EtOH use presented to the ED with shortness of breath, palpitations, orthopnea, symptoms ongoing for few weeks, worse last few days. 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 tomorrow -Started on Jardiance, low-dose BB for persistent sinus tach, add low-dose Aldactone -TSH and free T4 is normal -Cards consulted  Sinus tachycardia -Multifactorial secondary to above and low-grade withdrawal,  -Now on Coreg  EtOH abuse with mild withdrawal -Consumes 4 drinks of hard liquor usually daily - flushing and tachycardia noted, patient does not admit to this, now improving -Continue Librium protocol, thiamine, multivitamins Counseled regarding EtOH cessation  Elevated troponin likely from demand ischemia and tachycardia, monitor -Echo as noted above  Hypokalemia Replace  Hyperglycemia -A1c is 5.1  DVT prophylaxis: Lovenox Code Status: Full code Family Communication: None present Disposition Plan: Home pending above workup  Consultants:    Procedures:   Antimicrobials:    Objective: Vitals:   12/06/22 2347 12/07/22 0100 12/07/22 0325 12/07/22 0849  BP:  (!) 136/107 119/88 (!) 136/97  Pulse: (!) 109 (!) 112 (!) 112 99  Resp: 15 19 (!) 33 16  Temp:   98 F (36.7 C) (!) 97.5 F (36.4 C)  TempSrc:   Oral Oral  SpO2: 96% 92% 92% 93%  Weight:    75.3 kg   Height:        Intake/Output Summary (Last 24 hours) at 12/07/2022 1047 Last data filed at 12/07/2022 0328 Gross per 24 hour  Intake 240 ml  Output 1000 ml  Net -760 ml   Filed Weights   12/05/22 2244 12/06/22 0428 12/07/22 0325  Weight: 77.7 kg 77.2 kg 75.3 kg    Examination:  General exam: Appears calm and comfortable, AAOx3 ENT: No JVD, improved facial flushing Respiratory system: Decreased breath sounds at the bases Cardiovascular system: S1 & S2 heard, RRR.  Abd: nondistended, soft and nontender.Normal bowel sounds heard. Central nervous system: Alert and oriented. No focal neurological deficits. Extremities: no edema Skin: No rashes Psychiatry:  Mood & affect appropriate.     Data Reviewed:   CBC: Recent Labs  Lab 12/05/22 1541 12/06/22 0843 12/07/22 0210  WBC 8.8 8.3 7.0  NEUTROABS 7.0  --   --   HGB 13.6 13.5 14.5  HCT 38.6 39.3 41.8  MCV 97.0 97.0 97.7  PLT 335 329 354   Basic Metabolic Panel: Recent Labs  Lab 12/05/22 1541 12/06/22 0843 12/07/22 0210  NA 140 137 138  K 4.0 3.2* 3.4*  CL 107 102 101  CO2 21* 23 23  GLUCOSE 158* 119* 176*  BUN 7 9 13   CREATININE 0.74 0.84 0.97  CALCIUM 9.4 8.8* 9.4  MG  --   --  1.9   GFR: Estimated Creatinine Clearance: 72 mL/min (by C-G formula based on SCr of 0.97 mg/dL). Liver Function Tests: Recent Labs  Lab 12/05/22 1541 12/06/22 0843 12/07/22 0210  AST 69* 44* 42*  ALT 45* 38 34  ALKPHOS 62 66  70  BILITOT 0.7 1.0 0.8  PROT 6.8 6.2* 6.4*  ALBUMIN 4.3 3.4* 3.5   No results for input(s): "LIPASE", "AMYLASE" in the last 168 hours. No results for input(s): "AMMONIA" in the last 168 hours. Coagulation Profile: No results for input(s): "INR", "PROTIME" in the last 168 hours. Cardiac Enzymes: No results for input(s): "CKTOTAL", "CKMB", "CKMBINDEX", "TROPONINI" in the last 168 hours. BNP (last 3 results) No results for input(s): "PROBNP" in the last 8760 hours. HbA1C: Recent Labs     12/07/22 0206  HGBA1C 5.1   CBG: No results for input(s): "GLUCAP" in the last 168 hours. Lipid Profile: No results for input(s): "CHOL", "HDL", "LDLCALC", "TRIG", "CHOLHDL", "LDLDIRECT" in the last 72 hours. Thyroid Function Tests: Recent Labs    12/07/22 0210  TSH 3.466  FREET4 0.86   Anemia Panel: No results for input(s): "VITAMINB12", "FOLATE", "FERRITIN", "TIBC", "IRON", "RETICCTPCT" in the last 72 hours. Urine analysis:    Component Value Date/Time   COLORURINE YELLOW 12/05/2022 1541   APPEARANCEUR CLEAR 12/05/2022 1541   LABSPEC 1.017 12/05/2022 1541   PHURINE 5.5 12/05/2022 1541   GLUCOSEU NEGATIVE 12/05/2022 1541   HGBUR LARGE (A) 12/05/2022 1541   BILIRUBINUR NEGATIVE 12/05/2022 1541   KETONESUR 15 (A) 12/05/2022 1541   PROTEINUR TRACE (A) 12/05/2022 1541   NITRITE NEGATIVE 12/05/2022 1541   LEUKOCYTESUR NEGATIVE 12/05/2022 1541   Sepsis Labs: @LABRCNTIP (procalcitonin:4,lacticidven:4)  ) Recent Results (from the past 240 hour(s))  SARS Coronavirus 2 by RT PCR (hospital order, performed in [facility] hospital lab) *cepheid single result test* Anterior Nasal Swab     Status: None   Collection Time: 12/05/22  4:38 PM   Specimen: Anterior Nasal Swab  Result Value Ref Range Status   SARS Coronavirus 2 by RT PCR NEGATIVE NEGATIVE Final    Comment: (NOTE) SARS-CoV-2 target nucleic acids are NOT DETECTED.  The SARS-CoV-2 RNA is generally detectable in upper and lower respiratory specimens during the acute phase of infection. The lowest concentration of SARS-CoV-2 viral copies this assay can detect is 250 copies / mL. A negative result does not preclude SARS-CoV-2 infection and should not be used as the sole basis for treatment or other patient management decisions.  A negative result may occur with improper specimen collection / handling, submission of specimen other than nasopharyngeal swab, presence of viral mutation(s) within the areas targeted by this  assay, and inadequate number of viral copies (<250 copies / mL). A negative result must be combined with clinical observations, patient history, and epidemiological information.  Fact Sheet for Patients:   RoadLapTop.co.za  Fact Sheet for Healthcare Providers: http://kim-miller.com/  This test is not yet approved or  cleared by the [place] FDA and has been authorized for detection and/or diagnosis of SARS-CoV-2 by FDA under an Emergency Use Authorization (EUA).  This EUA will remain in effect (meaning this test can be used) for the duration of the COVID-19 declaration under Section 564(b)(1) of the Act, 21 U.S.C. section 360bbb-3(b)(1), unless the authorization is terminated or revoked sooner.  Performed at [facility], [address] [place], [place] [address]   MRSA Next Gen by PCR, Nasal     Status: None   Collection Time: 12/05/22 10:57 PM   Specimen: Nasal Mucosa; Nasal Swab  Result Value Ref Range Status   MRSA by PCR Next Gen NOT DETECTED NOT DETECTED Final    Comment: (NOTE) The GeneXpert MRSA Assay (FDA approved for NASAL specimens only), is one component of a comprehensive  MRSA colonization surveillance program. It is not intended to diagnose MRSA infection nor to guide or monitor treatment for MRSA infections. Test performance is not FDA approved in patients less than 57 years old. Performed at [facility], [address], [place], [place] [address]   Respiratory (~20 pathogens) panel by PCR     Status: None   Collection Time: 12/05/22 11:12 PM   Specimen: Nasopharyngeal Swab; Respiratory  Result Value Ref Range Status   Adenovirus NOT DETECTED NOT DETECTED Final   Coronavirus 229E NOT DETECTED NOT DETECTED Final    Comment: (NOTE) The Coronavirus on the Respiratory Panel, DOES NOT test for the novel  Coronavirus (2019 nCoV)    Coronavirus HKU1 NOT DETECTED NOT DETECTED Final    Coronavirus NL63 NOT DETECTED NOT DETECTED Final   Coronavirus OC43 NOT DETECTED NOT DETECTED Final   Metapneumovirus NOT DETECTED NOT DETECTED Final   Rhinovirus / Enterovirus NOT DETECTED NOT DETECTED Final   Influenza A NOT DETECTED NOT DETECTED Final   Influenza B NOT DETECTED NOT DETECTED Final   Parainfluenza Virus 1 NOT DETECTED NOT DETECTED Final   Parainfluenza Virus 2 NOT DETECTED NOT DETECTED Final   Parainfluenza Virus 3 NOT DETECTED NOT DETECTED Final   Parainfluenza Virus 4 NOT DETECTED NOT DETECTED Final   Respiratory Syncytial Virus NOT DETECTED NOT DETECTED Final   Bordetella pertussis NOT DETECTED NOT DETECTED Final   Bordetella Parapertussis NOT DETECTED NOT DETECTED Final   Chlamydophila pneumoniae NOT DETECTED NOT DETECTED Final   Mycoplasma pneumoniae NOT DETECTED NOT DETECTED Final    Comment: Performed at [facility], [address], [place], [place] [address]  Culture, blood (Routine X 2) w Reflex to ID Panel     Status: None (Preliminary result)   Collection Time: 12/05/22 11:37 PM   Specimen: BLOOD  Result Value Ref Range Status   Specimen Description BLOOD BLOOD LEFT ARM  Final   Special Requests   Final    BOTTLES DRAWN AEROBIC AND ANAEROBIC Blood Culture adequate volume   Culture   Final    NO GROWTH 1 DAY Performed at [facility], [address] [place], [place] [address]    Report Status PENDING  Incomplete  Culture, blood (Routine X 2) w Reflex to ID Panel     Status: None (Preliminary result)   Collection Time: 12/05/22 11:37 PM   Specimen: BLOOD  Result Value Ref Range Status   Specimen Description BLOOD BLOOD LEFT ARM  Final   Special Requests   Final    BOTTLES DRAWN AEROBIC AND ANAEROBIC Blood Culture adequate volume   Culture   Final    NO GROWTH 1 DAY Performed at [facility], [address] [place], [place] [address]    Report Status PENDING  Incomplete     Radiology Studies: ECHOCARDIOGRAM COMPLETE  Result  Date: 12/06/2022    ECHOCARDIOGRAM REPORT   Patient Name:   ATHANASIA KRIETE Date of Exam: 12/06/2022 Medical Rec #:  [id number]     Height:       64.0 in Accession #:    [id number]    Weight:       170.3 lb Date of Birth:  02-27-1975      BSA:          1.827 m Patient Age:    47 years      BP:           140/102 mmHg Patient Gender: F  HR:           110 bpm. Exam Location:  Inpatient Procedure: 2D Echo, Color Doppler and Cardiac Doppler Indications:    CHF  History:        Patient has no prior history of Echocardiogram examinations.                 CHF; Risk Factors:Hypertension and ETOH abuse and withdrawl.  Sonographer:    Milbert Coulter Referring Phys: [id number] SUBRINA SUNDIL IMPRESSIONS  1. Left ventricular ejection fraction, by estimation, is 25 to 30%. The left ventricle has severely decreased function. The left ventricle demonstrates global hypokinesis. The left ventricular internal cavity size was mildly dilated. Indeterminate diastolic filling due to E-A fusion.  2. Right ventricular systolic function is normal. The right ventricular size is normal. Tricuspid regurgitation signal is inadequate for assessing PA pressure.  3. The mitral valve is normal in structure. Mild to moderate mitral valve regurgitation.  4. The aortic valve was not well visualized. Aortic valve regurgitation is not visualized. No aortic stenosis is present.  5. The inferior vena cava is normal in size with greater than 50% respiratory variability, suggesting right atrial pressure of 3 mmHg. FINDINGS  Left Ventricle: Left ventricular ejection fraction, by estimation, is 25 to 30%. The left ventricle has severely decreased function. The left ventricle demonstrates global hypokinesis. The left ventricular internal cavity size was mildly dilated. There is no left ventricular hypertrophy. Indeterminate diastolic filling due to E-A fusion. Right Ventricle: The right ventricular size is normal. No increase in right ventricular wall  thickness. Right ventricular systolic function is normal. Tricuspid regurgitation signal is inadequate for assessing PA pressure. Left Atrium: Left atrial size was normal in size. Right Atrium: Right atrial size was normal in size. Pericardium: There is no evidence of pericardial effusion. Mitral Valve: The mitral valve is normal in structure. Mild to moderate mitral valve regurgitation. Tricuspid Valve: The tricuspid valve is normal in structure. Tricuspid valve regurgitation is trivial. Aortic Valve: The aortic valve was not well visualized. Aortic valve regurgitation is not visualized. No aortic stenosis is present. Aortic valve mean gradient measures 2.0 mmHg. Aortic valve peak gradient measures 3.5 mmHg. Aortic valve area, by VTI measures 3.05 cm. Pulmonic Valve: The pulmonic valve was not well visualized. Pulmonic valve regurgitation is trivial. Aorta: The aortic root and ascending aorta are structurally normal, with no evidence of dilitation. Venous: The inferior vena cava is normal in size with greater than 50% respiratory variability, suggesting right atrial pressure of 3 mmHg. IAS/Shunts: The interatrial septum was not well visualized.  LEFT VENTRICLE PLAX 2D LVIDd:         5.40 cm   Diastology LVIDs:         4.70 cm   LV e' medial:    6.64 cm/s LV PW:         1.10 cm   LV E/e' medial:  8.0 LV IVS:        1.00 cm   LV e' lateral:   6.85 cm/s LVOT diam:     2.20 cm   LV E/e' lateral: 7.8 LV SV:         41 LV SV Index:   23 LVOT Area:     3.80 cm  RIGHT VENTRICLE RV Basal diam:  2.60 cm RV Mid diam:    2.00 cm RV S prime:     12.90 cm/s TAPSE (M-mode): 1.4 cm LEFT ATRIUM  Index        RIGHT ATRIUM          Index LA diam:        3.50 cm 1.92 cm/m   RA Area:     9.26 cm LA Vol (A2C):   37.8 ml 20.69 ml/m  RA Volume:   17.50 ml 9.58 ml/m LA Vol (A4C):   42.9 ml 23.48 ml/m LA Biplane Vol: 44.3 ml 24.25 ml/m  AORTIC VALVE AV Area (Vmax):    2.98 cm AV Area (Vmean):   2.72 cm AV Area (VTI):      3.05 cm AV Vmax:           93.40 cm/s AV Vmean:          64.700 cm/s AV VTI:            0.136 m AV Peak Grad:      3.5 mmHg AV Mean Grad:      2.0 mmHg LVOT Vmax:         73.30 cm/s LVOT Vmean:        46.300 cm/s LVOT VTI:          0.109 m LVOT/AV VTI ratio: 0.80  AORTA Ao Root diam: 2.90 cm Ao Asc diam:  3.40 cm MITRAL VALVE MV Area (PHT): 6.54 cm       SHUNTS MV Decel Time: 116 msec       Systemic VTI:  0.11 m MR Peak grad:    104.0 mmHg   Systemic Diam: 2.20 cm MR Mean grad:    69.0 mmHg MR Vmax:         510.00 cm/s MR Vmean:        396.0 cm/s MR PISA:         1.01 cm MR PISA Eff ROA: 6 mm MR PISA Radius:  0.40 cm MV E velocity: 53.40 cm/s MV A velocity: 82.10 cm/s MV E/A ratio:  0.65 Epifanio Lesches MD Electronically signed by Epifanio Lesches MD Signature Date/Time: 12/06/2022/6:09:53 PM    Final    CT Angio Chest PE W/Cm &/Or Wo Cm  Result Date: 12/05/2022 CLINICAL DATA:  New shortness of breath and tachycardia. Recovering from a respiratory infection. EXAM: CT ANGIOGRAPHY CHEST WITH CONTRAST TECHNIQUE: Multidetector CT imaging of the chest was performed using the standard protocol during bolus administration of intravenous contrast. Multiplanar CT image reconstructions and MIPs were obtained to evaluate the vascular anatomy. RADIATION DOSE REDUCTION: This exam was performed according to the departmental dose-optimization program which includes automated exposure control, adjustment of the mA and/or kV according to patient size and/or use of iterative reconstruction technique. CONTRAST:  80mL OMNIPAQUE IOHEXOL 350 MG/ML SOLN COMPARISON:  Radiographs 12/05/2022 FINDINGS: Cardiovascular: No filling defect is identified in the pulmonary arterial tree to suggest pulmonary embolus. Mild cardiomegaly. Mediastinum/Nodes: No pathologic adenopathy. Lungs/Pleura: Moderate-sized bilateral pleural effusions with passive atelectasis. Secondary pulmonary lobular septal thickening especially in the lung  apices, with mosaic attenuation suspicious for congestive heart failure. However, there is also scattered subsolid nodularity and near solid nodularity in the lungs favoring the upper lobes, and atypical infectious process is not excluded. Bilateral airway thickening is present. Upper Abdomen: Suspected benign steatosis posteriorly in segment 4 of the liver for example on image 152 of series 4 Exophytic 1.7 by 1.4 cm lesion along the right kidney upper pole, internal density 21 Hounsfield units. Appearance favors a benign Bosniak category 2 cyst. No further imaging workup of this lesion is indicated. Musculoskeletal: Lower cervical  plate and screw fixator. Review of the MIP images confirms the above findings. IMPRESSION: 1. No filling defect is identified in the pulmonary arterial tree to suggest pulmonary embolus. 2. Moderate-sized bilateral pleural effusions with passive atelectasis. 3. Pulmonary lobular septal thickening especially in the lung apices, with mosaic attenuation suspicious for congestive heart failure. 4. However, there is also scattered subsolid nodularity and near solid nodularity in the lungs favoring the upper lobes, and atypical infectious process is not excluded. 5. Bilateral airway thickening. Electronically Signed   By: Gaylyn Rong M.D.   On: 12/05/2022 19:08   DG Chest 2 View  Result Date: 12/05/2022 CLINICAL DATA:  Shortness of breath. Heart racing since beginning of week. EXAM: CHEST - 2 VIEW COMPARISON:  None Available. FINDINGS: Cardiac silhouette and mediastinal contours are within limits. Moderate bilateral interstitial thickening. Minimal blunting of the left costophrenic angle on frontal view. Minimal blunting of the posterior costophrenic angles on lateral view no pneumothorax. Mild multilevel degenerative disc changes of the thoracic spine. ACDF hardware overlies the lower cervical spine. IMPRESSION: 1. Moderate bilateral interstitial thickening, which may represent  pulmonary edema or atypical infection. 2. Minimal blunting of the posterior costophrenic angles on lateral view, which may represent trace pleural effusions. Electronically Signed   By: Neita Garnet M.D.   On: 12/05/2022 17:09     Scheduled Meds:  carvedilol  6.25 mg Oral BID WC   chlordiazePOXIDE  25 mg Oral TID   Followed by   chlordiazePOXIDE  25 mg Oral BH-qamhs   Followed by   Melene Muller ON 12/08/2022] chlordiazePOXIDE  25 mg Oral Daily   empagliflozin  10 mg Oral Daily   enoxaparin (LOVENOX) injection  40 mg Subcutaneous Q24H   folic acid  1 mg Oral Daily   furosemide  40 mg Intravenous BID   guaiFENesin  600 mg Oral BID   [START ON 12/09/2022] influenza vac split trivalent PF  0.5 mL Intramuscular Tomorrow-1000   multivitamin with minerals  1 tablet Oral Daily   [START ON 12/09/2022] pneumococcal 20-valent conjugate vaccine  0.5 mL Intramuscular Tomorrow-1000   potassium chloride  40 mEq Oral BID   thiamine  100 mg Oral Daily   Or   thiamine  100 mg Intravenous Daily   Continuous Infusions:     LOS: 1 day    Time spent:    Zannie Cove, MD [facility]   12/07/2022, 10:47 AM

## 2022-12-08 ENCOUNTER — Other Ambulatory Visit (HOSPITAL_COMMUNITY): Payer: Self-pay

## 2022-12-08 ENCOUNTER — Encounter (HOSPITAL_COMMUNITY): Payer: Self-pay | Admitting: Internal Medicine

## 2022-12-08 ENCOUNTER — Inpatient Hospital Stay (HOSPITAL_COMMUNITY)
Admission: EM | Disposition: A | Discharge status: Home / Self Care | Payer: Self-pay | Source: Home / Self Care | Attending: Internal Medicine

## 2022-12-08 DIAGNOSIS — I509 Heart failure, unspecified: Secondary | ICD-10-CM | POA: Diagnosis not present

## 2022-12-08 DIAGNOSIS — I5021 Acute systolic (congestive) heart failure: Secondary | ICD-10-CM | POA: Diagnosis not present

## 2022-12-08 HISTORY — PX: RIGHT/LEFT HEART CATH AND CORONARY ANGIOGRAPHY: CATH118266

## 2022-12-08 LAB — LIPID PANEL
Cholesterol: 187 mg/dL (ref 0–200)
HDL: 69 mg/dL (ref >40)
LDL Cholesterol: 96 mg/dL (ref 0–99)
Total CHOL/HDL Ratio: 2.7 {ratio}
Triglycerides: 108 mg/dL (ref <150)
VLDL: 22 mg/dL (ref 0–40)

## 2022-12-08 LAB — POCT I-STAT EG7
Acid-Base Excess: 0 mmol/L (ref 0.0–2.0)
Bicarbonate: 25.6 mmol/L (ref 20.0–28.0)
Calcium, Ion: 1.2 mmol/L (ref 1.15–1.40)
HCT: 42 % (ref 36.0–46.0)
Hemoglobin: 14.3 g/dL (ref 12.0–15.0)
O2 Saturation: 60 %
Potassium: 3.3 mmol/L — ABNORMAL LOW (ref 3.5–5.1)
Sodium: 139 mmol/L (ref 135–145)
TCO2: 27 mmol/L (ref 22–32)
pCO2, Ven: 44.8 mm[Hg] (ref 44–60)
pH, Ven: 7.365 (ref 7.25–7.43)
pO2, Ven: 32 mm[Hg] (ref 32–45)

## 2022-12-08 LAB — COMPREHENSIVE METABOLIC PANEL
ALT: 34 U/L (ref 0–44)
AST: 33 U/L (ref 15–41)
Albumin: 3.5 g/dL (ref 3.5–5.0)
Alkaline Phosphatase: 68 U/L (ref 38–126)
Anion gap: 12 (ref 5–15)
BUN: 20 mg/dL (ref 6–20)
CO2: 24 mmol/L (ref 22–32)
Calcium: 9.4 mg/dL (ref 8.9–10.3)
Chloride: 99 mmol/L (ref 98–111)
Creatinine, Ser: 1.46 mg/dL — ABNORMAL HIGH (ref 0.44–1.00)
GFR, Estimated: 45 mL/min — ABNORMAL LOW (ref >60)
Glucose, Bld: 102 mg/dL — ABNORMAL HIGH (ref 70–99)
Potassium: 3.8 mmol/L (ref 3.5–5.1)
Sodium: 135 mmol/L (ref 135–145)
Total Bilirubin: 0.8 mg/dL (ref 0.3–1.2)
Total Protein: 6.3 g/dL — ABNORMAL LOW (ref 6.5–8.1)

## 2022-12-08 LAB — POCT I-STAT 7, (LYTES, BLD GAS, ICA,H+H)
Acid-base deficit: 2 mmol/L (ref 0.0–2.0)
Bicarbonate: 23 mmol/L (ref 20.0–28.0)
Calcium, Ion: 1.22 mmol/L (ref 1.15–1.40)
HCT: 42 % (ref 36.0–46.0)
Hemoglobin: 14.3 g/dL (ref 12.0–15.0)
O2 Saturation: 94 %
Potassium: 3.4 mmol/L — ABNORMAL LOW (ref 3.5–5.1)
Sodium: 138 mmol/L (ref 135–145)
TCO2: 24 mmol/L (ref 22–32)
pCO2 arterial: 37.7 mm[Hg] (ref 32–48)
pH, Arterial: 7.394 (ref 7.35–7.45)
pO2, Arterial: 70 mm[Hg] — ABNORMAL LOW (ref 83–108)

## 2022-12-08 SURGERY — RIGHT/LEFT HEART CATH AND CORONARY ANGIOGRAPHY
Anesthesia: LOCAL

## 2022-12-08 MED ORDER — PHENYLEPHRINE 80 MCG/ML (10ML) SYRINGE FOR IV PUSH (FOR BLOOD PRESSURE SUPPORT)
PREFILLED_SYRINGE | INTRAVENOUS | Status: AC
  Filled 2022-12-08: qty 10

## 2022-12-08 MED ORDER — SODIUM CHLORIDE 0.9 % IV SOLN
INTRAVENOUS | Status: AC | PRN
  Administered 2022-12-08: 250 mL via INTRAVENOUS

## 2022-12-08 MED ORDER — HYDRALAZINE HCL 20 MG/ML IJ SOLN: 10.0000 mg | INTRAMUSCULAR | Status: AC | PRN

## 2022-12-08 MED ORDER — MIDAZOLAM HCL 2 MG/2ML IJ SOLN
INTRAMUSCULAR | Status: AC
  Filled 2022-12-08: qty 2

## 2022-12-08 MED ORDER — ENOXAPARIN SODIUM 40 MG/0.4ML IJ SOSY
40.0000 mg | PREFILLED_SYRINGE | INTRAMUSCULAR | Status: DC
  Administered 2022-12-08: 40 mg via SUBCUTANEOUS
  Filled 2022-12-08: qty 0.4

## 2022-12-08 MED ORDER — LIDOCAINE HCL (PF) 1 % IJ SOLN
INTRAMUSCULAR | Status: DC | PRN
  Administered 2022-12-08 (×2): 2 mL

## 2022-12-08 MED ORDER — CARVEDILOL 3.125 MG PO TABS
3.1250 mg | ORAL_TABLET | Freq: Two times a day (BID) | ORAL | Status: DC
  Administered 2022-12-08 – 2022-12-09 (×2): 3.125 mg via ORAL
  Filled 2022-12-08 (×2): qty 1

## 2022-12-08 MED ORDER — ONDANSETRON HCL 4 MG/2ML IJ SOLN
INTRAMUSCULAR | Status: AC
  Filled 2022-12-08: qty 2

## 2022-12-08 MED ORDER — NOREPINEPHRINE 4 MG/250ML-% IV SOLN
INTRAVENOUS | Status: AC
  Filled 2022-12-08: qty 250

## 2022-12-08 MED ORDER — FENTANYL CITRATE (PF) 100 MCG/2ML IJ SOLN
INTRAMUSCULAR | Status: AC
  Filled 2022-12-08: qty 2

## 2022-12-08 MED ORDER — HEPARIN (PORCINE) IN NACL 2000-0.9 UNIT/L-% IV SOLN
INTRAVENOUS | Status: DC | PRN
  Administered 2022-12-08: 1000 mL

## 2022-12-08 MED ORDER — FENTANYL CITRATE (PF) 100 MCG/2ML IJ SOLN
INTRAMUSCULAR | Status: DC | PRN
  Administered 2022-12-08: 25 ug via INTRAVENOUS

## 2022-12-08 MED ORDER — MIDAZOLAM HCL 2 MG/2ML IJ SOLN
INTRAMUSCULAR | Status: DC | PRN
  Administered 2022-12-08: 1 mg via INTRAVENOUS

## 2022-12-08 MED ORDER — SODIUM CHLORIDE 0.9 % IV SOLN
250.0000 mL | INTRAVENOUS | Status: DC | PRN
Start: 2022-12-08 — End: 2022-12-09

## 2022-12-08 MED ORDER — HEPARIN SODIUM (PORCINE) 1000 UNIT/ML IJ SOLN
INTRAMUSCULAR | Status: AC
  Filled 2022-12-08: qty 10

## 2022-12-08 MED ORDER — VERAPAMIL HCL 2.5 MG/ML IV SOLN
INTRAVENOUS | Status: AC
  Filled 2022-12-08: qty 2

## 2022-12-08 MED ORDER — SACUBITRIL-VALSARTAN 24-26 MG PO TABS
1.0000 | ORAL_TABLET | Freq: Two times a day (BID) | ORAL | Status: DC
  Administered 2022-12-08 – 2022-12-09 (×2): 1 via ORAL
  Filled 2022-12-08 (×2): qty 1

## 2022-12-08 MED ORDER — HEPARIN SODIUM (PORCINE) 1000 UNIT/ML IJ SOLN
INTRAMUSCULAR | Status: DC | PRN
  Administered 2022-12-08: 4000 [IU] via INTRAVENOUS

## 2022-12-08 MED ORDER — LIDOCAINE HCL (PF) 1 % IJ SOLN
INTRAMUSCULAR | Status: AC
  Filled 2022-12-08: qty 30

## 2022-12-08 MED ORDER — VERAPAMIL HCL 2.5 MG/ML IV SOLN
INTRAVENOUS | Status: DC | PRN
  Administered 2022-12-08: 10 mL via INTRA_ARTERIAL

## 2022-12-08 MED ORDER — SODIUM CHLORIDE 0.9% FLUSH
3.0000 mL | Freq: Two times a day (BID) | INTRAVENOUS | Status: DC
  Administered 2022-12-08 (×2): 3 mL via INTRAVENOUS

## 2022-12-08 MED ORDER — IOHEXOL 350 MG/ML SOLN
INTRAVENOUS | Status: DC | PRN
  Administered 2022-12-08: 30 mL via INTRA_ARTERIAL

## 2022-12-08 MED ORDER — PHENYLEPHRINE 80 MCG/ML (10ML) SYRINGE FOR IV PUSH (FOR BLOOD PRESSURE SUPPORT)
PREFILLED_SYRINGE | INTRAVENOUS | Status: DC | PRN
  Administered 2022-12-08: 320 ug via INTRAVENOUS

## 2022-12-08 MED ORDER — SODIUM CHLORIDE 0.9% FLUSH: 3.0000 mL | INTRAVENOUS | Status: DC | PRN

## 2022-12-08 SURGICAL SUPPLY — 10 items
CATH 5FR JL3.5 JR4 ANG PIG MP (CATHETERS) IMPLANT
CATH BALLN WEDGE 5F 110CM (CATHETERS) IMPLANT
DEVICE RAD COMP TR BAND LRG (VASCULAR PRODUCTS) IMPLANT
GLIDESHEATH SLEND SS 6F .021 (SHEATH) IMPLANT
GUIDEWIRE INQWIRE 1.5J.035X260 (WIRE) IMPLANT
INQWIRE 1.5J .035X260CM (WIRE) ×1
PACK CARDIAC CATHETERIZATION (CUSTOM PROCEDURE TRAY) ×1 IMPLANT
SET ATX-X65L (MISCELLANEOUS) IMPLANT
SHEATH GLIDE SLENDER 4/5FR (SHEATH) IMPLANT
SHEATH PROBE COVER 6X72 (BAG) IMPLANT

## 2022-12-08 NOTE — Progress Notes (Signed)
Heart Failure Stewardship Pharmacist Progress Note   PCP: Leilani Able, MD PCP-Cardiologist: None    HPI:  [id number] initially presenting to the ED on 10/25  with ShOB, palpitations, orthopnea over the past few weeks, progressively worsening.  Has also had recent cough and congestion. No LE edema. PMH includes HTN, former tobacco use, daily EtOH use (5-6 drinks per night), upper GI bleed.   EKG upon presentation with tachycardia, PVCs. BNP elevated to 662. CXR 10/25 demonstrated possible pulmonary edema vs atypical infection. CT chest 10/25 demonstrated moderate bilateral pleural effusions with passive atelectasis, suspicious for congestive heart failure. Echo 10/26 with EF 25-30%, severely decreased LV function, global hypokinesis, RV normal, moderate MVR. Breifly treated with IV abx, but discontinued with PCT <0.10.   R/LHC 10/28 demonstrated no sx CAD. Mildly elevated filling pressures (RA 10, PCWP 15, LVEDP 18), mild-mod reduced CO/CI (3.6 L/min, 2.0 L/min/m2). Transient hypotension during procedure tx with NS and phenylephrine boluses. Recommended to hold 1x dose Entresto, decr carvedilol.   Diuresed with IV lasix over the weekend, planning to switch to PO post-catheterization. Patient reports that she has had ShOB over the past several months > mainly with activity such as walking up inclines or stairs. 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 Denies current ShOB during interview today. No LE edema - today or prior to admission. Pt reports that her anxiety has improved now that catheterization is over. She asks about whether she can continue to taker adderall after discharge, for school and work. Pt denies dizziness/lightheadedness.   Current HF Medications: Diuretic: furosemide 40 mg IV BID (last dose 10/27 1734) Beta Blocker: carvedilol 3.125 mg PO BID ACE/ARB/ARNI: Entresto (sacubitril/valsartan) 24-26 mg PO BID - holding AM dose  today MRA: spironolactone 12.5 mg PO daily SGLT2i: empagliflozin (Jardiance) 10 mg PO daily  Prior to admission HF Medications: Beta blocker: metoprolol succinate 25 mg PO BID (was just prescribed  on 10/24 for HTN)  Pertinent Lab Values: Serum creatinine 0.97 > 1.46 (BL0.8), eGFR 45 mL/min, BUN 20, Potassium 3.8, Sodium 135, BNP 662 > 1015, Magnesium 1.9, A1c 5.1%, LDL-C 96 mg/dL  Vital Signs: Weight: 161.0 lbs (admission weight: 171.3 lbs) - pt reported dry weight ~160 lbs Blood pressure: 90-120s/60-80s  Heart rate: 100s  I/O: net -1 L yesterday; net -0.6L since admission  Medication Assistance / Insurance Benefits Check: Does the patient have prescription insurance?  Yes Type of insurance plan: [employer] Llamas  Patient qualifies for use of manufacturer copay cards  PA initiated for Jardiance 12/08/22 - Approved. $40/30ds before copay card (estimated cost $10) Entresto $40/30ds before copay card (estimated cost $10)  Outpatient Pharmacy:  Prior to admission outpatient pharmacy: [facility], [address] Is the patient willing to use [facility] TOC pharmacy at discharge? Yes Is the patient willing to transition their outpatient pharmacy to utilize a [facility] outpatient pharmacy?   No   Assessment: 1. 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 NYHA class II-III symptoms. - Pt stable post-cath. Agree with deescalation of beta-blocker given mild-moderately reduced CI and hypotension during procedure. BP appears stable post-cath, agree with reinitiation of ARNI this afternoon.  - Diuresed well given decreased weight, improved dyspnea. Still slightly hypervolemic with mildly elevated filling pressures on RHC. Appropriate to hold diuresis today with elevated Scr and evaluate need for PO loop diuretic tomorrow. SGLT2i, ARNI, and MRA may provide adequate diuresis. -  Pt with AKI prior to [facility] likely 2/2 IV diuresis and initiation of  SGLT2i, ARNI, and MRA. Minimal dye used during cath. If no improvement in Scr tomorrow, may need to hold ARNI or MRA and f/u ability to escalate outpatient.   Plan: 1) Medication changes recommended at this time: - Continue carvedilol 3.125 mg PO BID - Continue empagliflozin 10 mg PO daily - Continue sacubitril/valsartan (Entresto) 24-26 mg PO BID - Continue spironolactone 12.5 mg PO daily - Evaluate Scr and s/sx of fluid overload tomorrow to determine need for PO furosemide at discharge  2) Patient assistance: - Pt eligible for coupon cards for Jardiance ($10/90s) and Entresto ($10/90s) - Completed PA for Jardiance 12/08/22 (key: [id number]) - APPROVED  3)  Education  - Education complete including basic pathophysiology of HF and importance of adherence to daily medications to improve heart function.  - Educated on continued adjustment and titration of medications in the inpatient and outpatient setting.  - Pt aware of importance of lifestyle interventions to prevent HF exacerbations.  - Advised pt to speak with cardiologist concerning continued use of Adderall given potential for increased HR, BP, and risk of arrhythmias.   Nils Pyle, PharmD PGY1 Pharmacy Resident

## 2022-12-08 NOTE — Progress Notes (Signed)
   Patient Name: Hannah Bullock Date of Encounter: 12/08/2022 [facility] Cardiologist: None O'[place]al  Interval Summary  .    Resting in bed laying flat.  She does state that her breathing is somewhat hindered but she may be anxious.  Overall feels better.  Vital Signs .    Vitals:   12/07/22 2030 12/08/22 0005 12/08/22 0424 12/08/22 0618  BP: 103/64 (!) 124/96 106/74   Pulse: (!) 105 (!) 107 (!) 104   Resp: 15 18 17    Temp: 97.6 F (36.4 C) 97.9 F (36.6 C) 98.7 F (37.1 C)   TempSrc: Oral Oral Oral   SpO2: 93% 95% 97%   Weight:    75.3 kg  Height:        Intake/Output Summary (Last 24 hours) at 12/08/2022 0826 Last data filed at 12/08/2022 0427 Gross per 24 hour  Intake --  Output 1090 ml  Net -1090 ml      12/08/2022    6:18 AM 12/07/2022    3:25 AM 12/06/2022    4:28 AM  Last 3 Weights  Weight (lbs) 166 lb 1.6 oz 166 lb 170 lb 4.8 oz  Weight (kg) 75.342 kg 75.297 kg 77.248 kg      Telemetry/ECG    No adverse arrhythmias, sinus rhythm 90s- Personally Reviewed Prior EKG showed sinus tachycardia 129 with PVCs Echo with EF 25 to 30%  Physical Exam .   GEN: No acute distress.   Neck: No JVD Cardiac: RRR, no murmurs, rubs, or gallops.  Respiratory: Minimal crackles at lung bases GI: Soft, nontender, non-distended  MS: No edema  Assessment & Plan .     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 -She has been diuresed with IV Lasix which is currently on hold after volume status appears improved.  Creatinine has increased to the 1.4 range from approximately 1.  This is secondary to IV diuresis, Aldactone, Jardiance, Entresto Newstart.  I would be comfortable with her advancing with cardiac catheterization as my suspicion is that she will have minimal coronary artery disease and we can use  very little dye during procedure.  It would be nice to have her right heart cath numbers to further adjust goal-directed medical therapy. -TSH is normal, HIV is negative, expressed the importance of alcohol cessation.   For questions or updates, please contact [facility] Please consult [email or web address] for contact info under        Signed, Donato Schultz, MD

## 2022-12-08 NOTE — Plan of Care (Signed)
  Problem: Education: Goal: Knowledge of General Education information will improve Description: Including pain rating scale, medication(s)/side effects and non-pharmacologic comfort measures Outcome: Progressing   Problem: Health Behavior/Discharge Planning: Goal: Ability to manage health-related needs will improve Outcome: Progressing   Problem: Clinical Measurements: Goal: Ability to maintain clinical measurements within normal limits will improve Outcome: Progressing Goal: Will remain free from infection Outcome: Progressing Goal: Diagnostic test results will improve Outcome: Progressing Goal: Respiratory complications will improve Outcome: Progressing   Problem: Nutrition: Goal: Adequate nutrition will be maintained Outcome: Progressing   Problem: Coping: Goal: Level of anxiety will decrease Outcome: Progressing

## 2022-12-08 NOTE — Progress Notes (Signed)
Heart Failure Nurse Navigator Progress Note  PCP: Leilani Able, MD PCP-Cardiologist: None Admission Diagnosis: Bilateral pleural effusions, Acute congestive heart failure, sinus bradycardia Admitted from: Home  Presentation:   Hannah Bullock presented with shortness of breath, feels like heart rate is racing, generalized weakness, recently getting over a respiratory infection. Reports to drinking around 5 bourbons per day. BNP 1015.6, EKG shows tachycardia, R/L HC on 10/28 showed no sx CAD.   ECHO/ LVEF: 25-30% New  Clinical Course:  Past Medical History:  Diagnosis Date   Anxiety    Medical history non-contributory      Social History   Socioeconomic History   Marital status: Married    Spouse name: Not on file   Number of children: Not on file   Years of education: Not on file   Highest education level: Not on file  Occupational History   Not on file  Tobacco Use   Smoking status: Former    Current packs/day: 0.00    Types: Cigarettes    Quit date: 04/06/2016    Years since quitting: 6.6   Smokeless tobacco: Never  Vaping Use   Vaping status: Never Used  Substance and Sexual Activity   Alcohol use: Yes    Alcohol/week: 35.0 standard drinks of alcohol    Types: 35 Shots of liquor per week    Comment: 5 shots of bourbon per day   Drug use: No   Sexual activity: Yes  Other Topics Concern   Not on file  Social History Narrative   Not on file   Social Determinants of Health   Financial Resource Strain: Not on file  Food Insecurity: No Food Insecurity (12/07/2022)   Hunger Vital Sign    Worried About Running Out of Food in the Last Year: Never true    Ran Out of Food in the Last Year: Never true  Transportation Needs: No Transportation Needs (12/07/2022)   PRAPARE - Administrator, Civil Service (Medical): No    Lack of Transportation (Non-Medical): No  Physical Activity: Not on file  Stress: Not on file  Social Connections: Not on file    Education Assessment and Provision:  Detailed education and instructions provided on heart failure disease management including the following:  Signs and symptoms of Heart Failure When to call the physician Importance of daily weights Low sodium diet Fluid restriction Medication management Anticipated future follow-up appointments  Patient education given on each of the above topics.  Patient acknowledges understanding via teach back method and acceptance of all instructions.  Education Materials:  "Living Better With Heart Failure" Booklet, HF zone tool, & Daily Weight Tracker Tool.  Patient has scale at home: yes  Patient has pill box at home: NA    High Risk Criteria for Readmission and/or Poor Patient Outcomes: Heart failure hospital admissions (last 6 months): 0  No Show rate: 0 Difficult social situation: No, lives with husband Demonstrates medication adherence: Yes Primary Language: English  Literacy level: Reading, writing, and comprehension  Barriers of Care:   ETOH consumption ( 5 bourbons per day)  Diet/ fluid restrictions Daily weights   Considerations/Referrals:   Referral made to Heart Failure Pharmacist Stewardship: Yes Referral made to Heart Failure CSW/NCM TOC: Yes, Substance cessation Referral made to Heart & Vascular TOC clinic: Yes, 12/16/2022 @ 11 am  Items for Follow-up on DC/TOC: Diet/ fluid restrictions Daily weights ETOH cessation ( 5 bourbons per day)    Rhae Hammock, BSN, RN Heart Failure Statistician  Secure Chat Only

## 2022-12-08 NOTE — Progress Notes (Addendum)
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 dose prior to cath, hold further IV Lasix -BMP in a.m.  AKI -Secondary to [facility], see discussion above  Sinus tachycardia -Multifactorial secondary to above and low-grade withdrawal,  -Now on Coreg  EtOH abuse with mild withdrawal -Consumes 4 drinks of hard liquor usually daily - flushing and tachycardia noted, patient does not admit to this, now improving -Continue Librium protocol, thiamine, multivitamins Counseled regarding EtOH cessation  Elevated troponin likely from demand ischemia and tachycardia, monitor -Echo as noted above  Hypokalemia Replaced  Hyperglycemia -A1c is 5.1  DVT prophylaxis: Lovenox Code Status: Full code Family Communication: None present Disposition Plan: Home pending above workup  Consultants:    Procedures:   Antimicrobials:    Objective: Vitals:   12/08/22 0424 12/08/22 0618 12/08/22 0914 12/08/22 0929  BP: 106/74     Pulse: (!) 104     Resp: 17     Temp: 98.7 F (37.1  C)     TempSrc: Oral     SpO2: 97%  92% 90%  Weight:  75.3 kg    Height:        Intake/Output Summary (Last 24 hours) at 12/08/2022 0938 Last data filed at 12/08/2022 0427 Gross per 24 hour  Intake --  Output 1090 ml  Net -1090 ml   Filed Weights   12/06/22 0428 12/07/22 0325 12/08/22 0618  Weight: 77.2 kg 75.3 kg 75.3 kg    Examination:  Gen: Awake, Alert, Oriented X 3,  HEENT: no JVD Lungs: Good air movement bilaterally, CTAB CVS: S1S2/RRR Abd: soft, Non tender, non distended, BS present Extremities: No edema Skin: no new rashes on exposed skin     Data Reviewed:   CBC: Recent Labs  Lab 12/05/22 1541 12/06/22 0843 12/07/22 0210  WBC 8.8 8.3 7.0  NEUTROABS 7.0  --   --   HGB 13.6 13.5 14.5  HCT 38.6 39.3 41.8  MCV 97.0 97.0 97.7  PLT 335 329 354   Basic Metabolic Panel: Recent Labs  Lab 12/05/22 1541 12/06/22 0843 12/07/22 0210 12/08/22 0214  NA 140 137 138 135  K 4.0 3.2* 3.4* 3.8  CL 107 102 101 99  CO2 21* 23 23 24   GLUCOSE 158* 119* 176* 102*  BUN 7 9 13 20   CREATININE 0.74 0.84 0.97 1.46*  CALCIUM 9.4 8.8* 9.4 9.4  MG  --   --  1.9  --    GFR: Estimated Creatinine Clearance: 47.8 mL/min (A) (by C-G formula based on SCr of 1.46 mg/dL (H)). Liver Function Tests: Recent Labs  Lab 12/05/22 1541 12/06/22 0843 12/07/22 0210 12/08/22 0214  AST 69* 44* 42* 33  ALT 45* 38 34  34  ALKPHOS 62 66 70 68  BILITOT 0.7 1.0 0.8 0.8  PROT 6.8 6.2* 6.4* 6.3*  ALBUMIN 4.3 3.4* 3.5 3.5   No results for input(s): "LIPASE", "AMYLASE" in the last 168 hours. No results for input(s): "AMMONIA" in the last 168 hours. Coagulation Profile: No results for input(s): "INR", "PROTIME" in the last 168 hours. Cardiac Enzymes: No results for input(s): "CKTOTAL", "CKMB", "CKMBINDEX", "TROPONINI" in the last 168 hours. BNP (last 3 results) No results for input(s): "PROBNP" in the last 8760 hours. HbA1C: Recent Labs    12/07/22 0206  HGBA1C 5.1   CBG: No  results for input(s): "GLUCAP" in the last 168 hours. Lipid Profile: Recent Labs    12/08/22 0214  CHOL 187  HDL 69  LDLCALC 96  TRIG 108  CHOLHDL 2.7   Thyroid Function Tests: Recent Labs    12/07/22 0210  TSH 3.466  FREET4 0.86   Anemia Panel: No results for input(s): "VITAMINB12", "FOLATE", "FERRITIN", "TIBC", "IRON", "RETICCTPCT" in the last 72 hours. Urine analysis:    Component Value Date/Time   COLORURINE YELLOW 12/05/2022 1541   APPEARANCEUR CLEAR 12/05/2022 1541   LABSPEC 1.017 12/05/2022 1541   PHURINE 5.5 12/05/2022 1541   GLUCOSEU NEGATIVE 12/05/2022 1541   HGBUR LARGE (A) 12/05/2022 1541   BILIRUBINUR NEGATIVE 12/05/2022 1541   KETONESUR 15 (A) 12/05/2022 1541   PROTEINUR TRACE (A) 12/05/2022 1541   NITRITE NEGATIVE 12/05/2022 1541   LEUKOCYTESUR NEGATIVE 12/05/2022 1541   Sepsis Labs: @LABRCNTIP (procalcitonin:4,lacticidven:4)  ) Recent Results (from the past 240 hour(s))  SARS Coronavirus 2 by RT PCR (hospital order, performed in [facility] hospital lab) *cepheid single result test* Anterior Nasal Swab     Status: None   Collection Time: 12/05/22  4:38 PM   Specimen: Anterior Nasal Swab  Result Value Ref Range Status   SARS Coronavirus 2 by RT PCR NEGATIVE NEGATIVE Final    Comment: (NOTE) SARS-CoV-2 target nucleic acids are NOT DETECTED.  The SARS-CoV-2 RNA is generally detectable in upper and lower respiratory specimens during the acute phase of infection. The lowest concentration of SARS-CoV-2 viral copies this assay can detect is 250 copies / mL. A negative result does not preclude SARS-CoV-2 infection and should not be used as the sole basis for treatment or other patient management decisions.  A negative result may occur with improper specimen collection / handling, submission of specimen other than nasopharyngeal swab, presence of viral mutation(s) within the areas targeted by this assay, and inadequate number of viral copies (<250 copies  / mL). A negative result must be combined with clinical observations, patient history, and epidemiological information.  Fact Sheet for Patients:   RoadLapTop.co.za  Fact Sheet for Healthcare Providers: http://kim-miller.com/  This test is not yet approved or  cleared by the [place] FDA and has been authorized for detection and/or diagnosis of SARS-CoV-2 by FDA under an Emergency Use Authorization (EUA).  This EUA will remain in effect (meaning this test can be used) for the duration of the COVID-19 declaration under Section 564(b)(1) of the Act, 21 U.S.C. section 360bbb-3(b)(1), unless the authorization is terminated or revoked sooner.  Performed at [facility], [address] [place], [place] [address]   MRSA Next Gen by PCR, Nasal     Status: None   Collection Time: 12/05/22 10:57 PM   Specimen: Nasal Mucosa; Nasal Swab  Result Value Ref Range Status   MRSA by PCR Next Gen NOT DETECTED NOT DETECTED Final  Comment: (NOTE) The GeneXpert MRSA Assay (FDA approved for NASAL specimens only), is one component of a comprehensive MRSA colonization surveillance program. It is not intended to diagnose MRSA infection nor to guide or monitor treatment for MRSA infections. Test performance is not FDA approved in patients less than 53 years old. Performed at [facility], [address], [place], [place] [address]   Respiratory (~20 pathogens) panel by PCR     Status: None   Collection Time: 12/05/22 11:12 PM   Specimen: Nasopharyngeal Swab; Respiratory  Result Value Ref Range Status   Adenovirus NOT DETECTED NOT DETECTED Final   Coronavirus 229E NOT DETECTED NOT DETECTED Final    Comment: (NOTE) The Coronavirus on the Respiratory Panel, DOES NOT test for the novel  Coronavirus (2019 nCoV)    Coronavirus HKU1 NOT DETECTED NOT DETECTED Final   Coronavirus NL63 NOT DETECTED NOT DETECTED Final   Coronavirus  OC43 NOT DETECTED NOT DETECTED Final   Metapneumovirus NOT DETECTED NOT DETECTED Final   Rhinovirus / Enterovirus NOT DETECTED NOT DETECTED Final   Influenza A NOT DETECTED NOT DETECTED Final   Influenza B NOT DETECTED NOT DETECTED Final   Parainfluenza Virus 1 NOT DETECTED NOT DETECTED Final   Parainfluenza Virus 2 NOT DETECTED NOT DETECTED Final   Parainfluenza Virus 3 NOT DETECTED NOT DETECTED Final   Parainfluenza Virus 4 NOT DETECTED NOT DETECTED Final   Respiratory Syncytial Virus NOT DETECTED NOT DETECTED Final   Bordetella pertussis NOT DETECTED NOT DETECTED Final   Bordetella Parapertussis NOT DETECTED NOT DETECTED Final   Chlamydophila pneumoniae NOT DETECTED NOT DETECTED Final   Mycoplasma pneumoniae NOT DETECTED NOT DETECTED Final    Comment: Performed at [facility], [address] [place], [place] [address]  Culture, blood (Routine X 2) w Reflex to ID Panel     Status: None (Preliminary result)   Collection Time: 12/05/22 11:37 PM   Specimen: BLOOD  Result Value Ref Range Status   Specimen Description BLOOD BLOOD LEFT ARM  Final   Special Requests   Final    BOTTLES DRAWN AEROBIC AND ANAEROBIC Blood Culture adequate volume   Culture   Final    NO GROWTH 2 DAYS Performed at [facility], [address] [place], [place] [address]    Report Status PENDING  Incomplete  Culture, blood (Routine X 2) w Reflex to ID Panel     Status: None (Preliminary result)   Collection Time: 12/05/22 11:37 PM   Specimen: BLOOD  Result Value Ref Range Status   Specimen Description BLOOD BLOOD LEFT ARM  Final   Special Requests   Final    BOTTLES DRAWN AEROBIC AND ANAEROBIC Blood Culture adequate volume   Culture   Final    NO GROWTH 2 DAYS Performed at [facility], [address] [place], [place] [address]    Report Status PENDING  Incomplete     Radiology Studies: ECHOCARDIOGRAM COMPLETE  Result Date: 12/06/2022    ECHOCARDIOGRAM REPORT   Patient Name:    FORRESTINE REDEKER Date of Exam: 12/06/2022 Medical Rec #:  [id number]     Height:       64.0 in Accession #:    [id number]    Weight:       170.3 lb Date of Birth:  1975/06/28      BSA:          1.827 m Patient Age:    47 years  BP:           140/102 mmHg Patient Gender: F             HR:           110 bpm. Exam Location:  Inpatient Procedure: 2D Echo, Color Doppler and Cardiac Doppler Indications:    CHF  History:        Patient has no prior history of Echocardiogram examinations.                 CHF; Risk Factors:Hypertension and ETOH abuse and withdrawl.  Sonographer:    Milbert Coulter Referring Phys: [id number] SUBRINA SUNDIL IMPRESSIONS  1. Left ventricular ejection fraction, by estimation, is 25 to 30%. The left ventricle has severely decreased function. The left ventricle demonstrates global hypokinesis. The left ventricular internal cavity size was mildly dilated. Indeterminate diastolic filling due to E-A fusion.  2. Right ventricular systolic function is normal. The right ventricular size is normal. Tricuspid regurgitation signal is inadequate for assessing PA pressure.  3. The mitral valve is normal in structure. Mild to moderate mitral valve regurgitation.  4. The aortic valve was not well visualized. Aortic valve regurgitation is not visualized. No aortic stenosis is present.  5. The inferior vena cava is normal in size with greater than 50% respiratory variability, suggesting right atrial pressure of 3 mmHg. FINDINGS  Left Ventricle: Left ventricular ejection fraction, by estimation, is 25 to 30%. The left ventricle has severely decreased function. The left ventricle demonstrates global hypokinesis. The left ventricular internal cavity size was mildly dilated. There is no left ventricular hypertrophy. Indeterminate diastolic filling due to E-A fusion. Right Ventricle: The right ventricular size is normal. No increase in right ventricular wall thickness. Right ventricular systolic function is normal. Tricuspid  regurgitation signal is inadequate for assessing PA pressure. Left Atrium: Left atrial size was normal in size. Right Atrium: Right atrial size was normal in size. Pericardium: There is no evidence of pericardial effusion. Mitral Valve: The mitral valve is normal in structure. Mild to moderate mitral valve regurgitation. Tricuspid Valve: The tricuspid valve is normal in structure. Tricuspid valve regurgitation is trivial. Aortic Valve: The aortic valve was not well visualized. Aortic valve regurgitation is not visualized. No aortic stenosis is present. Aortic valve mean gradient measures 2.0 mmHg. Aortic valve peak gradient measures 3.5 mmHg. Aortic valve area, by VTI measures 3.05 cm. Pulmonic Valve: The pulmonic valve was not well visualized. Pulmonic valve regurgitation is trivial. Aorta: The aortic root and ascending aorta are structurally normal, with no evidence of dilitation. Venous: The inferior vena cava is normal in size with greater than 50% respiratory variability, suggesting right atrial pressure of 3 mmHg. IAS/Shunts: The interatrial septum was not well visualized.  LEFT VENTRICLE PLAX 2D LVIDd:         5.40 cm   Diastology LVIDs:         4.70 cm   LV e' medial:    6.64 cm/s LV PW:         1.10 cm   LV E/e' medial:  8.0 LV IVS:        1.00 cm   LV e' lateral:   6.85 cm/s LVOT diam:     2.20 cm   LV E/e' lateral: 7.8 LV SV:         41 LV SV Index:   23 LVOT Area:     3.80 cm  RIGHT VENTRICLE RV Basal diam:  2.60 cm RV Mid diam:  2.00 cm RV S prime:     12.90 cm/s TAPSE (M-mode): 1.4 cm LEFT ATRIUM             Index        RIGHT ATRIUM          Index LA diam:        3.50 cm 1.92 cm/m   RA Area:     9.26 cm LA Vol (A2C):   37.8 ml 20.69 ml/m  RA Volume:   17.50 ml 9.58 ml/m LA Vol (A4C):   42.9 ml 23.48 ml/m LA Biplane Vol: 44.3 ml 24.25 ml/m  AORTIC VALVE AV Area (Vmax):    2.98 cm AV Area (Vmean):   2.72 cm AV Area (VTI):     3.05 cm AV Vmax:           93.40 cm/s AV Vmean:          64.700  cm/s AV VTI:            0.136 m AV Peak Grad:      3.5 mmHg AV Mean Grad:      2.0 mmHg LVOT Vmax:         73.30 cm/s LVOT Vmean:        46.300 cm/s LVOT VTI:          0.109 m LVOT/AV VTI ratio: 0.80  AORTA Ao Root diam: 2.90 cm Ao Asc diam:  3.40 cm MITRAL VALVE MV Area (PHT): 6.54 cm       SHUNTS MV Decel Time: 116 msec       Systemic VTI:  0.11 m MR Peak grad:    104.0 mmHg   Systemic Diam: 2.20 cm MR Mean grad:    69.0 mmHg MR Vmax:         510.00 cm/s MR Vmean:        396.0 cm/s MR PISA:         1.01 cm MR PISA Eff ROA: 6 mm MR PISA Radius:  0.40 cm MV E velocity: 53.40 cm/s MV A velocity: 82.10 cm/s MV E/A ratio:  0.65 Epifanio Lesches MD Electronically signed by Epifanio Lesches MD Signature Date/Time: 12/06/2022/6:09:53 PM    Final      Scheduled Meds:  [ZOX Hold] carvedilol  6.25 mg Oral BID WC   chlordiazePOXIDE  25 mg Oral BH-qamhs   Followed by   [facility] Hold] chlordiazePOXIDE  25 mg Oral Daily   [MAR Hold] empagliflozin  10 mg Oral Daily   [MAR Hold] enoxaparin (LOVENOX) injection  40 mg Subcutaneous Q24H   [MAR Hold] folic acid  1 mg Oral Daily   [MAR Hold] guaiFENesin  600 mg Oral BID   [MAR Hold] influenza vac split trivalent PF  0.5 mL Intramuscular Tomorrow-1000   [MAR Hold] multivitamin with minerals  1 tablet Oral Daily   [MAR Hold] pneumococcal 20-valent conjugate vaccine  0.5 mL Intramuscular Tomorrow-1000   [MAR Hold] sacubitril-valsartan  1 tablet Oral BID   [MAR Hold] spironolactone  12.5 mg Oral Daily   [MAR Hold] thiamine  100 mg Oral Daily   Or   [MAR Hold] thiamine  100 mg Intravenous Daily   Continuous Infusions:     LOS: 2 days    Time spent:    Zannie Cove, MD [facility]   12/08/2022, 9:38 AM

## 2022-12-08 NOTE — H&P (View-Only) (Signed)
   Patient Name: Hannah Bullock Date of Encounter: 12/08/2022 [facility] Cardiologist: None O'Neal  Interval Summary  .    Resting in bed laying flat.  She does state that her breathing is somewhat hindered but she may be anxious.  Overall feels better.  Vital Signs .    Vitals:   12/07/22 2030 12/08/22 0005 12/08/22 0424 12/08/22 0618  BP: 103/64 (!) 124/96 106/74   Pulse: (!) 105 (!) 107 (!) 104   Resp: 15 18 17    Temp: 97.6 F (36.4 C) 97.9 F (36.6 C) 98.7 F (37.1 C)   TempSrc: Oral Oral Oral   SpO2: 93% 95% 97%   Weight:    75.3 kg  Height:        Intake/Output Summary (Last 24 hours) at 12/08/2022 0826 Last data filed at 12/08/2022 0427 Gross per 24 hour  Intake --  Output 1090 ml  Net -1090 ml      12/08/2022    6:18 AM 12/07/2022    3:25 AM 12/06/2022    4:28 AM  Last 3 Weights  Weight (lbs) 166 lb 1.6 oz 166 lb 170 lb 4.8 oz  Weight (kg) 75.342 kg 75.297 kg 77.248 kg      Telemetry/ECG    No adverse arrhythmias, sinus rhythm 90s- Personally Reviewed Prior EKG showed sinus tachycardia 129 with PVCs Echo with EF 25 to 30%  Physical Exam .   GEN: No acute distress.   Neck: No JVD Cardiac: RRR, no murmurs, rubs, or gallops.  Respiratory: Minimal crackles at lung bases GI: Soft, nontender, non-distended  MS: No edema  Assessment & Plan .     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 -She has been diuresed with IV Lasix which is currently on hold after volume status appears improved.  Creatinine has increased to the 1.4 range from approximately 1.  This is secondary to IV diuresis, Aldactone, Jardiance, Entresto Newstart.  I would be comfortable with her advancing with cardiac catheterization as my suspicion is that she will have minimal coronary artery disease and we can use  very little dye during procedure.  It would be nice to have her right heart cath numbers to further adjust goal-directed medical therapy. -TSH is normal, HIV is negative, expressed the importance of alcohol cessation.   For questions or updates, please contact [facility] Please consult [email or web address] for contact info under        Signed, Donato Schultz, MD

## 2022-12-08 NOTE — Interval H&P Note (Signed)
History and Physical Interval Note:  12/08/2022 9:10 AM  Hannah Bullock  has presented today for surgery, with the diagnosis of acute HFrEF.  The various methods of treatment have been discussed with the patient and family. After consideration of risks, benefits and other options for treatment, the patient has consented to  Procedure(s): RIGHT/LEFT HEART CATH AND CORONARY ANGIOGRAPHY (N/A) as a surgical intervention.  The patient's history has been reviewed, patient examined, no change in status, stable for surgery.  I have reviewed the patient's chart and labs.  Questions were answered to the patient's satisfaction.    Cath Lab Visit (complete for each Cath Lab visit)  Clinical Evaluation Leading to the Procedure:   ACS: No.  Non-ACS:    Anginal/Heart Failure Classification: NYHA class IV  Anti-ischemic medical therapy: Minimal Therapy (1 class of medications)  Non-Invasive Test Results: No ischemia evaluation.  LVEF 25-30% -> high risk  Prior CABG: No previous CABG  Maddox Bratcher

## 2022-12-09 ENCOUNTER — Other Ambulatory Visit (HOSPITAL_COMMUNITY): Payer: Self-pay

## 2022-12-09 ENCOUNTER — Encounter (HOSPITAL_COMMUNITY): Payer: Self-pay | Admitting: Internal Medicine

## 2022-12-09 DIAGNOSIS — I509 Heart failure, unspecified: Secondary | ICD-10-CM | POA: Diagnosis not present

## 2022-12-09 LAB — COMPREHENSIVE METABOLIC PANEL
ALT: 30 U/L (ref 0–44)
AST: 34 U/L (ref 15–41)
Albumin: 3.5 g/dL (ref 3.5–5.0)
Alkaline Phosphatase: 69 U/L (ref 38–126)
Anion gap: 5 (ref 5–15)
BUN: 13 mg/dL (ref 6–20)
CO2: 27 mmol/L (ref 22–32)
Calcium: 9.5 mg/dL (ref 8.9–10.3)
Chloride: 104 mmol/L (ref 98–111)
Creatinine, Ser: 1.12 mg/dL — ABNORMAL HIGH (ref 0.44–1.00)
GFR, Estimated: >60 mL/min (ref >60)
Glucose, Bld: 113 mg/dL — ABNORMAL HIGH (ref 70–99)
Potassium: 4.5 mmol/L (ref 3.5–5.1)
Sodium: 136 mmol/L (ref 135–145)
Total Bilirubin: 0.6 mg/dL (ref 0.3–1.2)
Total Protein: 6.3 g/dL — ABNORMAL LOW (ref 6.5–8.1)

## 2022-12-09 MED ORDER — SACUBITRIL-VALSARTAN 24-26 MG PO TABS
1.0000 | ORAL_TABLET | Freq: Two times a day (BID) | ORAL | 0 refills | Status: DC
  Filled 2022-12-09: qty 60, 30d supply, fill #0

## 2022-12-09 MED ORDER — FUROSEMIDE 20 MG PO TABS
20.0000 mg | ORAL_TABLET | ORAL | 0 refills | Status: DC | PRN
  Filled 2022-12-09: qty 10, 10d supply, fill #0

## 2022-12-09 MED ORDER — SPIRONOLACTONE 25 MG PO TABS
12.5000 mg | ORAL_TABLET | Freq: Every day | ORAL | 0 refills | Status: DC
  Filled 2022-12-09: qty 15, 30d supply, fill #0

## 2022-12-09 MED ORDER — EMPAGLIFLOZIN 10 MG PO TABS
10.0000 mg | ORAL_TABLET | Freq: Every day | ORAL | 0 refills | Status: DC
  Filled 2022-12-09: qty 30, 30d supply, fill #0

## 2022-12-09 MED ORDER — CARVEDILOL 3.125 MG PO TABS
3.1250 mg | ORAL_TABLET | Freq: Two times a day (BID) | ORAL | 0 refills | Status: DC
  Filled 2022-12-09: qty 60, 30d supply, fill #0

## 2022-12-09 MED FILL — Norepinephrine-Dextrose IV Solution 4 MG/250ML-5%: INTRAVENOUS | Qty: 250 | Status: AC

## 2022-12-09 MED FILL — Ondansetron HCl Inj 4 MG/2ML (2 MG/ML): INTRAMUSCULAR | Qty: 2 | Status: AC

## 2022-12-09 NOTE — Progress Notes (Signed)
   Patient Name: Hannah Bullock Date of Encounter: 12/09/2022 [facility] Cardiologist: None   Interval Summary  .    Overall feels well this morning laying flat comfortable.  We will initiate discharge.  She is asymptomatic with her blood pressure currently.  Vital Signs .    Vitals:   12/08/22 2046 12/08/22 2300 12/09/22 0314 12/09/22 0752  BP: 100/76 118/88 107/85 106/76  Pulse:   93 99  Resp: (!) 23 (!) 23 14 17   Temp: 98 F (36.7 C) 97.8 F (36.6 C) 97.7 F (36.5 C) 97.8 F (36.6 C)  TempSrc: Oral Oral Axillary Oral  SpO2:   97% 95%  Weight:   76.1 kg   Height:        Intake/Output Summary (Last 24 hours) at 12/09/2022 0855 Last data filed at 12/09/2022 0321 Gross per 24 hour  Intake --  Output 600 ml  Net -600 ml      12/09/2022    3:14 AM 12/08/2022    6:18 AM 12/07/2022    3:25 AM  Last 3 Weights  Weight (lbs) 167 lb 12.8 oz 166 lb 1.6 oz 166 lb  Weight (kg) 76.114 kg 75.342 kg 75.297 kg      Telemetry/ECG    Sinus tach/ NSR now 90's - Personally Reviewed  Physical Exam .   GEN: No acute distress.   Neck: No JVD Cardiac: Cath site normal. RRR, no murmurs, rubs, or gallops.  Respiratory: Clear to auscultation bilaterally. GI: Soft, nontender, non-distended  MS: No edema  Assessment & Plan .     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  We will continue to monitor this as an outpatient.  I think discharge is reasonable.  We will have follow-up with transition of care heart failure clinic.  Discussion has taken place. Next Tues.  -EF 30%.    For questions or updates, please contact [facility] Please consult [email or web address] for contact info under         Signed, Donato Schultz, MD

## 2022-12-09 NOTE — TOC Initial Note (Signed)
Transition of Care [facility]) - Initial/Assessment Note    Patient Details  Name: Hannah Bullock MRN: [id number] Date of Birth: 1975/09/28  Transition of Care [facility]) CM/SW Contact:    Leone Haven, RN Phone Number: 12/09/2022, 9:52 AM  Clinical Narrative:                 From home with spouse, has PCP and insurance on file, states has no HH services in place at this time or DME at home.  States family member will transport them home at [employer] and family is support system, [facility] [address]  Pta self ambulatory .    Expected Discharge Plan: Home/Self Care Barriers to Discharge: No Barriers Identified   Patient Goals and CMS Choice Patient states their goals for this hospitalization and ongoing recovery are:: return home   Choice offered to / list presented to : NA      Expected Discharge Plan and Services In-house Referral: NA Discharge Planning Services: CM Consult Post Acute Care Choice: NA Living arrangements for the past 2 months: Single Family Home Expected Discharge Date: 12/09/22               DME Arranged: N/A DME Agency: NA       HH Arranged: NA          Prior Living Arrangements/Services Living arrangements for the past 2 months: Single Family Home Lives with:: Spouse Patient language and need for interpreter reviewed:: Yes Do you feel safe going back to the place where you live?: Yes      Need for Family Participation in Patient Care: No (Comment) Care giver support system in place?: Yes (comment)   Criminal Activity/Legal Involvement Pertinent to Current Situation/Hospitalization: No - Comment as needed  Activities of Daily Living   ADL Screening (condition at time of admission) Independently performs ADLs?: Yes (appropriate for developmental age) Is the patient deaf or have difficulty hearing?: No Does the patient have difficulty seeing, even when wearing glasses/contacts?: No Does the patient have difficulty  concentrating, remembering, or making decisions?: No  Permission Sought/Granted Permission sought to share information with : Case Manager Permission granted to share information with : Yes, Verbal Permission Granted              Emotional Assessment Appearance:: Appears stated age Attitude/Demeanor/Rapport: Engaged Affect (typically observed): Appropriate Orientation: : Oriented to Self, Oriented to Place, Oriented to Situation, Oriented to  Time Alcohol / Substance Use: Alcohol Use Psych Involvement: No (comment)  Admission diagnosis:  Sinus tachycardia [R00.0] Bilateral pleural effusion [J90] New onset of congestive heart failure (HCC) [I50.9] Acute congestive heart failure, unspecified heart failure type (HCC) [I50.9] Patient Active Problem List   Diagnosis Date Noted   New onset of congestive heart failure (HCC) 12/05/2022   Generalized anxiety disorder 12/05/2022   Essential hypertension 12/05/2022   Transaminitis 12/05/2022   Bronchitis 12/05/2022   CAP (community acquired pneumonia) 12/05/2022   Elevated troponin 12/05/2022   Alcohol withdrawal (HCC) 12/05/2022   Acute gastric ulcer with hemorrhage 04/06/2017   Acute blood loss anemia 04/06/2017   Alcohol abuse 04/06/2017   Tobacco abuse 04/06/2017   Syncope    Upper GI bleed    Coffee ground emesis    PCP:  Leilani Able, MD Pharmacy:   [phone] [address] [place] - [address] [address] Phone: [phone] Fax: [phone]  [facility] [address] [place]  [address] Phone: [phone] Fax: [phone]     Social Determinants of Health (SDOH) Social History: SDOH Screenings   Food Insecurity: No Food Insecurity (12/07/2022)  Housing: Low Risk  (12/08/2022)  Transportation Needs: No Transportation Needs (12/07/2022)  Utilities: Not At Risk (12/07/2022)  Alcohol Screen: Low Risk  (12/08/2022)  Financial Resource  Strain: Low Risk  (12/08/2022)  Tobacco Use: Medium Risk (12/05/2022)   SDOH Interventions: Housing Interventions: Intervention Not Indicated Financial Strain Interventions: Intervention Not Indicated   Readmission Risk Interventions     No data to display

## 2022-12-09 NOTE — TOC Transition Note (Addendum)
Transition of Care [facility]) - CM/SW Discharge Note   Patient Details  Name: Hannah Bullock MRN: [id number] Date of Birth: December 11, 1975  Transition of Care [facility]) CM/SW Contact:  Leone Haven, RN Phone Number: 12/09/2022, 9:54 AM   Clinical Narrative:    For dc today, NCM gave patient the printed out form for Jardiance 10 co pay and gave her the entresto coupon for 10.00 co pay.  She states she has transport home today. Per pharmacy the copay for jardiance and entresto is 40.00.   Final next level of care: Home/Self Care Barriers to Discharge: No Barriers Identified   Patient Goals and CMS Choice   Choice offered to / list presented to : NA  Discharge Placement                         Discharge Plan and Services Additional resources added to the After Visit Summary for   In-house Referral: NA Discharge Planning Services: CM Consult Post Acute Care Choice: NA          DME Arranged: N/A DME Agency: NA       HH Arranged: NA          Social Determinants of Health (SDOH) Interventions SDOH Screenings   Food Insecurity: No Food Insecurity (12/07/2022)  Housing: Low Risk  (12/08/2022)  Transportation Needs: No Transportation Needs (12/07/2022)  Utilities: Not At Risk (12/07/2022)  Alcohol Screen: Low Risk  (12/08/2022)  Financial Resource Strain: Low Risk  (12/08/2022)  Tobacco Use: Medium Risk (12/05/2022)     Readmission Risk Interventions     No data to display

## 2022-12-09 NOTE — Progress Notes (Signed)
Heart Failure Stewardship Pharmacist Progress Note   PCP: Leilani Able, MD PCP-Cardiologist: None    HPI:  47YOF initially presenting to the ED on 10/25  with ShOB, palpitations, orthopnea over the past few weeks, progressively worsening.  Has also had recent cough and congestion. No LE edema. PMH includes HTN, former tobacco use, daily EtOH use (5-6 drinks per night), upper GI bleed.   EKG upon presentation with tachycardia, PVCs. BNP elevated to 662. CXR 10/25 demonstrated possible pulmonary edema vs atypical infection. CT chest 10/25 demonstrated moderate bilateral pleural effusions with passive atelectasis, suspicious for congestive heart failure. Echo 10/26 with EF 25-30%, severely decreased LV function, global hypokinesis, RV normal, moderate MVR. Breifly treated with IV abx, but discontinued with PCT <0.10.   R/LHC 10/28 demonstrated no sx CAD. Mildly elevated filling pressures (RA 10, PCWP 15, LVEDP 18), mild-mod reduced CO/CI (3.6 L/min, 2.0 L/min/m2). Transient hypotension during procedure tx with NS and phenylephrine boluses. Recommended to hold 1x dose Entresto, decr carvedilol.   Diuresed with IV lasix over the weekend, planning to switch to PO post-catheterization. Patient reports that she has had ShOB over the past several months > mainly with activity such as walking up inclines or stairs. 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    Current HF Medications: Beta Blocker: carvedilol 3.125 mg PO BID ACE/ARB/ARNI: Entresto (sacubitril/valsartan) 24-26 mg PO BID MRA: spironolactone 12.5 mg PO daily SGLT2i: empagliflozin (Jardiance) 10 mg PO daily  Prior to admission HF Medications: Beta blocker: metoprolol succinate 25 mg PO BID (was just prescribed  on 10/24 for HTN)  Pertinent Lab Values: Serum creatinine 1.12 (BL0.8), eGFR >60 mL/min, BUN 13, Potassium 4.5, Sodium 136, BNP 662 > 1015, Magnesium 1.9, A1c 5.1%, LDL-C 96  mg/dL  Vital Signs: Weight: 782 lbs (admission weight: 171.3 lbs) - pt reported dry weight ~160 lbs Blood pressure: 100-110s/70-80s  Heart rate: 90s  I/O: net -1.1 L yesterday; net -1.2L since admission  Medication Assistance / Insurance Benefits Check: Does the patient have prescription insurance?  Yes Type of insurance plan: [employer] Llamas  Patient qualifies for use of manufacturer copay cards  PA initiated for Jardiance 12/08/22 - Approved. $40/30ds before copay card (estimated cost $10) Entresto $40/30ds before copay card (estimated cost $10)  Outpatient Pharmacy:  Prior to admission [facility] [place] [place] [facility] Is the patient willing to use [facility] TOC pharmacy at discharge? Yes Is the patient willing to transition their outpatient pharmacy to utilize a [facility] outpatient pharmacy?   No   Assessment: 1. 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 NYHA class II symptoms. - Pt stable post-cath. Agree with deescalation of beta-blocker given mild-moderately reduced CI and hypotension during procedure. BP appears stable post-cath. - Diuresed well, improved dyspnea. May need PRN lasix at discharge. Continue SGLT2i, ARNI, and MRA. Creatinine improved.    Plan: 1) Medication changes recommended at this time: - Continue carvedilol 3.125 mg PO BID - Continue empagliflozin 10 mg PO daily - Continue sacubitril/valsartan (Entresto) 24-26 mg PO BID - Continue spironolactone 12.5 mg PO daily - Add furosemide 40 mg daily PRN at discharge  2) Patient assistance: - Pt eligible for coupon cards for Jardiance ($10/90s) and Entresto ($10/90s) - Completed PA for Jardiance 12/08/22 (key: [id number]) - APPROVED  3)  Education  - Education complete including basic pathophysiology of HF and importance of adherence to daily medications  to improve heart function.  - Educated on continued adjustment and titration of  medications in the inpatient and outpatient setting.  - Pt aware of importance of lifestyle interventions to prevent HF exacerbations.   Sharen Hones, PharmD, BCPS Heart Failure Stewardship Pharmacist Phone [phone]

## 2022-12-09 NOTE — Discharge Summary (Signed)
Physician Discharge Summary  Hannah Bullock VPX:[id number] DOB: 10-07-1975 DOA: 12/05/2022  PCP: Leilani Able, MD  Admit date: 12/05/2022 Discharge date: 12/09/2022  Time spent:  Recommendations for Outpatient Follow-up:  CHF TOC clinic follow-up made Please check BMP in 1 week   Discharge Diagnoses:  Principal Problem: Acute systolic CHF, NICM, new diagnosis   Alcohol abuse   Generalized anxiety disorder   Essential hypertension   Transaminitis   Bronchitis   Elevated troponin   Discharge Condition: Improved  Diet recommendation: Low-sodium, heart healthy  Filed Weights   12/07/22 0325 12/08/22 0618 12/09/22 0314  Weight: 75.3 kg 75.3 kg 76.1 kg    History of present illness:  47/F, former smoker, bartender, daily EtOH use presented to the ED with shortness of breath, palpitations, orthopnea, symptoms ongoing for few weeks, worse last few days. 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 -Echo noted EF 25-30%, global hypokinesis, normal RV, indeterminate diastolic function -Diuresed with IV Lasix, volume status has improved -Cards consulting, left heart cath yesterday was negative for CAD -Transitioned to [place], Jardiance, Aldactone, Coreg and oral Lasix PRN at discharge  -Follow up in CHF TOC clinic next week   AKI -Secondary to [facility], now improving   Sinus tachycardia -Multifactorial secondary to above and low-grade withdrawal,  -Now on Coreg   EtOH abuse with mild withdrawal -Consumes 4-6 drinks of hard liquor usually daily - flushing and tachycardia noted, patient does not admit to this, now improving -Treated with Librium thiamine and multivitamins,  counseled regarding EtOH cessation   Elevated troponin likely from demand ischemia and tachycardia, monitor -Echo as noted above   Hypokalemia Replaced   Hyperglycemia -A1c is 5.1  Discharge Exam: Vitals:   12/09/22 0314 12/09/22 0752  BP: 107/85 106/76  Pulse: 93 99  Resp: 14 17  Temp: 97.7 F (36.5 C) 97.8 F (36.6 C)  SpO2: 97% 95%   Gen: Awake, Alert, Oriented X 3,  HEENT: no JVD Lungs: Good air movement bilaterally, CTAB CVS: S1S2/RRR Abd: soft, Non tender, non distended, BS present Extremities: No edema Skin: no new rashes on exposed skin   Discharge Instructions   Discharge Instructions     Diet - low sodium heart healthy   Complete by: As directed    Increase activity slowly   Complete by: As directed       Allergies as of 12/09/2022       Reactions   Prednisone Rash        Medication List     STOP taking these medications    ibuprofen 200 MG tablet Commonly known as: ADVIL   metoprolol succinate 25 MG 24 hr tablet Commonly known as: TOPROL-XL   traZODone 50 MG tablet Commonly known as: DESYREL       TAKE these medications    albuterol 108 (90 Base) MCG/ACT inhaler Commonly known as: VENTOLIN HFA Inhale 2 puffs into the lungs every 4 (four) hours as needed.   amphetamine-dextroamphetamine 20 MG 24 hr capsule Commonly known as: ADDERALL XR Take 20 mg by mouth every morning.   amphetamine-dextroamphetamine 10 MG tablet Commonly known as: ADDERALL Take 10 mg by mouth daily. Patient takes on hectic days, when she goes to school and work  carvedilol 3.125 MG tablet Commonly known as: COREG Take 1 tablet (3.125 mg total) by mouth 2 (two) times daily with a meal.   Claritin 10 MG tablet Generic drug: loratadine Take 10 mg by mouth daily.   DHEA 25 MG Caps Take 1 capsule by mouth daily.   Entresto 24-26 MG Generic drug: sacubitril-valsartan Take 1 tablet by mouth 2 (two) times daily.   estrogens (conjugated) 1.25 MG  tablet Commonly known as: PREMARIN Take 1.25 mg by mouth daily.   furosemide 20 MG tablet Commonly known as: Lasix Take 1 tablet (20 mg total) by mouth as needed for fluid or edema (For weight gain of 3 LB in 1 day or 5 LB in 1 week).   hydrOXYzine 25 MG tablet Commonly known as: ATARAX Take 25-50 mg by mouth at bedtime.   Jardiance 10 MG Tabs tablet Generic drug: empagliflozin Take 1 tablet (10 mg total) by mouth daily. Start taking on: December 10, 2022   MUCINEX FAST-MAX PO Take 20 mLs by mouth daily.   progesterone 100 MG capsule Commonly known as: PROMETRIUM Take 100 mg by mouth daily.   spironolactone 25 MG tablet Commonly known as: ALDACTONE Take 0.5 tablets (12.5 mg total) by mouth daily. Start taking on: December 10, 2022   VITAMIN D-3 PO Take 1 capsule by mouth daily.       Allergies  Allergen Reactions   Prednisone Rash    Follow-up Information      [facility]. Go in 6 day(s).   Specialty: Cardiology Why: Hospital follow up 12/16/2022 @ 11 am PLEASE bring a current medication list to appointment FREE valet parking, Entrance C, off [employer] information: [address] [place] [address] [phone]                 The results of significant diagnostics from this hospitalization (including imaging, microbiology, ancillary and laboratory) are listed below for reference.    Significant Diagnostic Studies: CARDIAC CATHETERIZATION  Result Date: 12/08/2022 Conclusions: No angiographically significant coronary artery disease. Upper normal to mildly elevated left and right heart filling pressures (RA/RVEDP 10 mmHg, PCWP 15 mmHg, LVEDP 18 mmHg). Mildly to moderately reduced Fick cardiac output/index (CO 3.6 L/min, CI 2.0 L/min/m^2). Transient hypotension suspected to be due to combination of sedation and vasovagal response, successfully treated with normal saline and  phenylephrine boluses. Recommendations: Hold this morning's dose of Entresto and reduce carvedilol to 3.125 mg twice daily.  Escalate goal-directed medical therapy in the future as blood pressure and renal function allow. Maintain net even to slightly negative fluid balance. Primary prevention of coronary artery disease. Yvonne Kendall, MD [facility]  ECHOCARDIOGRAM COMPLETE  Result Date: 12/06/2022    ECHOCARDIOGRAM REPORT   Patient Name:   FANNY BEEL Date of Exam: 12/06/2022 Medical Rec #:  [id number]     Height:       64.0 in Accession #:    [id number]    Weight:       170.3 lb Date of Birth:  07-03-1975      BSA:          1.827 m Patient Age:    47 years      BP:           140/102 mmHg Patient Gender: F             HR:           110 bpm. Exam Location:  Inpatient  Procedure: 2D Echo, Color Doppler and Cardiac Doppler Indications:    CHF  History:        Patient has no prior history of Echocardiogram examinations.                 CHF; Risk Factors:Hypertension and ETOH abuse and withdrawl.  Sonographer:    Milbert Coulter Referring Phys: [id number] SUBRINA SUNDIL IMPRESSIONS  1. Left ventricular ejection fraction, by estimation, is 25 to 30%. The left ventricle has severely decreased function. The left ventricle demonstrates global hypokinesis. The left ventricular internal cavity size was mildly dilated. Indeterminate diastolic filling due to E-A fusion.  2. Right ventricular systolic function is normal. The right ventricular size is normal. Tricuspid regurgitation signal is inadequate for assessing PA pressure.  3. The mitral valve is normal in structure. Mild to moderate mitral valve regurgitation.  4. The aortic valve was not well visualized. Aortic valve regurgitation is not visualized. No aortic stenosis is present.  5. The inferior vena cava is normal in size with greater than 50% respiratory variability, suggesting right atrial pressure of 3 mmHg. FINDINGS  Left Ventricle: Left ventricular ejection  fraction, by estimation, is 25 to 30%. The left ventricle has severely decreased function. The left ventricle demonstrates global hypokinesis. The left ventricular internal cavity size was mildly dilated. There is no left ventricular hypertrophy. Indeterminate diastolic filling due to E-A fusion. Right Ventricle: The right ventricular size is normal. No increase in right ventricular wall thickness. Right ventricular systolic function is normal. Tricuspid regurgitation signal is inadequate for assessing PA pressure. Left Atrium: Left atrial size was normal in size. Right Atrium: Right atrial size was normal in size. Pericardium: There is no evidence of pericardial effusion. Mitral Valve: The mitral valve is normal in structure. Mild to moderate mitral valve regurgitation. Tricuspid Valve: The tricuspid valve is normal in structure. Tricuspid valve regurgitation is trivial. Aortic Valve: The aortic valve was not well visualized. Aortic valve regurgitation is not visualized. No aortic stenosis is present. Aortic valve mean gradient measures 2.0 mmHg. Aortic valve peak gradient measures 3.5 mmHg. Aortic valve area, by VTI measures 3.05 cm. Pulmonic Valve: The pulmonic valve was not well visualized. Pulmonic valve regurgitation is trivial. Aorta: The aortic root and ascending aorta are structurally normal, with no evidence of dilitation. Venous: The inferior vena cava is normal in size with greater than 50% respiratory variability, suggesting right atrial pressure of 3 mmHg. IAS/Shunts: The interatrial septum was not well visualized.  LEFT VENTRICLE PLAX 2D LVIDd:         5.40 cm   Diastology LVIDs:         4.70 cm   LV e' medial:    6.64 cm/s LV PW:         1.10 cm   LV E/e' medial:  8.0 LV IVS:        1.00 cm   LV e' lateral:   6.85 cm/s LVOT diam:     2.20 cm   LV E/e' lateral: 7.8 LV SV:         41 LV SV Index:   23 LVOT Area:     3.80 cm  RIGHT VENTRICLE RV Basal diam:  2.60 cm RV Mid diam:    2.00 cm RV S prime:      12.90 cm/s TAPSE (M-mode): 1.4 cm LEFT ATRIUM             Index        RIGHT ATRIUM  Index LA diam:        3.50 cm 1.92 cm/m   RA Area:     9.26 cm LA Vol (A2C):   37.8 ml 20.69 ml/m  RA Volume:   17.50 ml 9.58 ml/m LA Vol (A4C):   42.9 ml 23.48 ml/m LA Biplane Vol: 44.3 ml 24.25 ml/m  AORTIC VALVE AV Area (Vmax):    2.98 cm AV Area (Vmean):   2.72 cm AV Area (VTI):     3.05 cm AV Vmax:           93.40 cm/s AV Vmean:          64.700 cm/s AV VTI:            0.136 m AV Peak Grad:      3.5 mmHg AV Mean Grad:      2.0 mmHg LVOT Vmax:         73.30 cm/s LVOT Vmean:        46.300 cm/s LVOT VTI:          0.109 m LVOT/AV VTI ratio: 0.80  AORTA Ao Root diam: 2.90 cm Ao Asc diam:  3.40 cm MITRAL VALVE MV Area (PHT): 6.54 cm       SHUNTS MV Decel Time: 116 msec       Systemic VTI:  0.11 m MR Peak grad:    104.0 mmHg   Systemic Diam: 2.20 cm MR Mean grad:    69.0 mmHg MR Vmax:         510.00 cm/s MR Vmean:        396.0 cm/s MR PISA:         1.01 cm MR PISA Eff ROA: 6 mm MR PISA Radius:  0.40 cm MV E velocity: 53.40 cm/s MV A velocity: 82.10 cm/s MV E/A ratio:  0.65 Epifanio Lesches MD Electronically signed by Epifanio Lesches MD Signature Date/Time: 12/06/2022/6:09:53 PM    Final    CT Angio Chest PE W/Cm &/Or Wo Cm  Result Date: 12/05/2022 CLINICAL DATA:  New shortness of breath and tachycardia. Recovering from a respiratory infection. EXAM: CT ANGIOGRAPHY CHEST WITH CONTRAST TECHNIQUE: Multidetector CT imaging of the chest was performed using the standard protocol during bolus administration of intravenous contrast. Multiplanar CT image reconstructions and MIPs were obtained to evaluate the vascular anatomy. RADIATION DOSE REDUCTION: This exam was performed according to the departmental dose-optimization program which includes automated exposure control, adjustment of the mA and/or kV according to patient size and/or use of iterative reconstruction technique. CONTRAST:  80mL OMNIPAQUE  IOHEXOL 350 MG/ML SOLN COMPARISON:  Radiographs 12/05/2022 FINDINGS: Cardiovascular: No filling defect is identified in the pulmonary arterial tree to suggest pulmonary embolus. Mild cardiomegaly. Mediastinum/Nodes: No pathologic adenopathy. Lungs/Pleura: Moderate-sized bilateral pleural effusions with passive atelectasis. Secondary pulmonary lobular septal thickening especially in the lung apices, with mosaic attenuation suspicious for congestive heart failure. However, there is also scattered subsolid nodularity and near solid nodularity in the lungs favoring the upper lobes, and atypical infectious process is not excluded. Bilateral airway thickening is present. Upper Abdomen: Suspected benign steatosis posteriorly in segment 4 of the liver for example on image 152 of series 4 Exophytic 1.7 by 1.4 cm lesion along the right kidney upper pole, internal density 21 Hounsfield units. Appearance favors a benign Bosniak category 2 cyst. No further imaging workup of this lesion is indicated. Musculoskeletal: Lower cervical plate and screw fixator. Review of the MIP images confirms the above findings. IMPRESSION: 1. No filling defect is identified  in the pulmonary arterial tree to suggest pulmonary embolus. 2. Moderate-sized bilateral pleural effusions with passive atelectasis. 3. Pulmonary lobular septal thickening especially in the lung apices, with mosaic attenuation suspicious for congestive heart failure. 4. However, there is also scattered subsolid nodularity and near solid nodularity in the lungs favoring the upper lobes, and atypical infectious process is not excluded. 5. Bilateral airway thickening. Electronically Signed   By: Gaylyn Rong M.D.   On: 12/05/2022 19:08   DG Chest 2 View  Result Date: 12/05/2022 CLINICAL DATA:  Shortness of breath. Heart racing since beginning of week. EXAM: CHEST - 2 VIEW COMPARISON:  None Available. FINDINGS: Cardiac silhouette and mediastinal contours are within  limits. Moderate bilateral interstitial thickening. Minimal blunting of the left costophrenic angle on frontal view. Minimal blunting of the posterior costophrenic angles on lateral view no pneumothorax. Mild multilevel degenerative disc changes of the thoracic spine. ACDF hardware overlies the lower cervical spine. IMPRESSION: 1. Moderate bilateral interstitial thickening, which may represent pulmonary edema or atypical infection. 2. Minimal blunting of the posterior costophrenic angles on lateral view, which may represent trace pleural effusions. Electronically Signed   By: Neita Garnet M.D.   On: 12/05/2022 17:09    Microbiology: Recent Results (from the past 240 hour(s))  SARS Coronavirus 2 by RT PCR (hospital order, performed in [facility] hospital lab) *cepheid single result test* Anterior Nasal Swab     Status: None   Collection Time: 12/05/22  4:38 PM   Specimen: Anterior Nasal Swab  Result Value Ref Range Status   SARS Coronavirus 2 by RT PCR NEGATIVE NEGATIVE Final    Comment: (NOTE) SARS-CoV-2 target nucleic acids are NOT DETECTED.  The SARS-CoV-2 RNA is generally detectable in upper and lower respiratory specimens during the acute phase of infection. The lowest concentration of SARS-CoV-2 viral copies this assay can detect is 250 copies / mL. A negative result does not preclude SARS-CoV-2 infection and should not be used as the sole basis for treatment or other patient management decisions.  A negative result may occur with improper specimen collection / handling, submission of specimen other than nasopharyngeal swab, presence of viral mutation(s) within the areas targeted by this assay, and inadequate number of viral copies (<250 copies / mL). A negative result must be combined with clinical observations, patient history, and epidemiological information.  Fact Sheet for Patients:   RoadLapTop.co.za  Fact Sheet for Healthcare  Providers: http://kim-miller.com/  This test is not yet approved or  cleared by the [place] FDA and has been authorized for detection and/or diagnosis of SARS-CoV-2 by FDA under an Emergency Use Authorization (EUA).  This EUA will remain in effect (meaning this test can be used) for the duration of the COVID-19 declaration under Section 564(b)(1) of the Act, 21 U.S.C. section 360bbb-3(b)(1), unless the authorization is terminated or revoked sooner.  Performed at [facility], [address] [place], [place] [address]   MRSA Next Gen by PCR, Nasal     Status: None   Collection Time: 12/05/22 10:57 PM   Specimen: Nasal Mucosa; Nasal Swab  Result Value Ref Range Status   MRSA by PCR Next Gen NOT DETECTED NOT DETECTED Final    Comment: (NOTE) The GeneXpert MRSA Assay (FDA approved for NASAL specimens only), is one component of a comprehensive MRSA colonization surveillance program. It is not intended to diagnose MRSA infection nor to guide or monitor treatment for MRSA infections. Test performance is not FDA approved in patients less than  82 years old. Performed at [facility], [address] [place], [place] [address]   Respiratory (~20 pathogens) panel by PCR     Status: None   Collection Time: 12/05/22 11:12 PM   Specimen: Nasopharyngeal Swab; Respiratory  Result Value Ref Range Status   Adenovirus NOT DETECTED NOT DETECTED Final   Coronavirus 229E NOT DETECTED NOT DETECTED Final    Comment: (NOTE) The Coronavirus on the Respiratory Panel, DOES NOT test for the novel  Coronavirus (2019 nCoV)    Coronavirus HKU1 NOT DETECTED NOT DETECTED Final   Coronavirus NL63 NOT DETECTED NOT DETECTED Final   Coronavirus OC43 NOT DETECTED NOT DETECTED Final   Metapneumovirus NOT DETECTED NOT DETECTED Final   Rhinovirus / Enterovirus NOT DETECTED NOT DETECTED Final   Influenza A NOT DETECTED NOT DETECTED Final   Influenza B NOT DETECTED  NOT DETECTED Final   Parainfluenza Virus 1 NOT DETECTED NOT DETECTED Final   Parainfluenza Virus 2 NOT DETECTED NOT DETECTED Final   Parainfluenza Virus 3 NOT DETECTED NOT DETECTED Final   Parainfluenza Virus 4 NOT DETECTED NOT DETECTED Final   Respiratory Syncytial Virus NOT DETECTED NOT DETECTED Final   Bordetella pertussis NOT DETECTED NOT DETECTED Final   Bordetella Parapertussis NOT DETECTED NOT DETECTED Final   Chlamydophila pneumoniae NOT DETECTED NOT DETECTED Final   Mycoplasma pneumoniae NOT DETECTED NOT DETECTED Final    Comment: Performed at [facility], [address] [place], [place] [address]  Culture, blood (Routine X 2) w Reflex to ID Panel     Status: None (Preliminary result)   Collection Time: 12/05/22 11:37 PM   Specimen: BLOOD  Result Value Ref Range Status   Specimen Description BLOOD BLOOD LEFT ARM  Final   Special Requests   Final    BOTTLES DRAWN AEROBIC AND ANAEROBIC Blood Culture adequate volume   Culture   Final    NO GROWTH 2 DAYS Performed at [facility], [address] [place], [place] [address]    Report Status PENDING  Incomplete  Culture, blood (Routine X 2) w Reflex to ID Panel     Status: None (Preliminary result)   Collection Time: 12/05/22 11:37 PM   Specimen: BLOOD  Result Value Ref Range Status   Specimen Description BLOOD BLOOD LEFT ARM  Final   Special Requests   Final    BOTTLES DRAWN AEROBIC AND ANAEROBIC Blood Culture adequate volume   Culture   Final    NO GROWTH 2 DAYS Performed at [facility], [address], [place], [place] [address]    Report Status PENDING  Incomplete     Labs: Basic Metabolic Panel: Recent Labs  Lab 12/05/22 1541 12/06/22 0843 12/07/22 0210 12/08/22 0214 12/08/22 0935 12/08/22 0938 12/09/22 0219  NA 140 137 138 135 139 138 136  K 4.0 3.2* 3.4* 3.8 3.3* 3.4* 4.5  CL 107 102 101 99  --   --  104  CO2 21* 23 23 24   --   --  27  GLUCOSE 158* 119* 176* 102*  --   --  113*  BUN  7 9 13 20   --   --  13  CREATININE 0.74 0.84 0.97 1.46*  --   --  1.12*  CALCIUM 9.4 8.8* 9.4 9.4  --   --  9.5  MG  --   --  1.9  --   --   --   --    Liver  Function Tests: Recent Labs  Lab 12/05/22 1541 12/06/22 0843 12/07/22 0210 12/08/22 0214 12/09/22 0219  AST 69* 44* 42* 33 34  ALT 45* 38 34 34 30  ALKPHOS 62 66 70 68 69  BILITOT 0.7 1.0 0.8 0.8 0.6  PROT 6.8 6.2* 6.4* 6.3* 6.3*  ALBUMIN 4.3 3.4* 3.5 3.5 3.5   No results for input(s): "LIPASE", "AMYLASE" in the last 168 hours. No results for input(s): "AMMONIA" in the last 168 hours. CBC: Recent Labs  Lab 12/05/22 1541 12/06/22 0843 12/07/22 0210 12/08/22 0935 12/08/22 0938  WBC 8.8 8.3 7.0  --   --   NEUTROABS 7.0  --   --   --   --   HGB 13.6 13.5 14.5 14.3 14.3  HCT 38.6 39.3 41.8 42.0 42.0  MCV 97.0 97.0 97.7  --   --   PLT 335 329 354  --   --    Cardiac Enzymes: No results for input(s): "CKTOTAL", "CKMB", "CKMBINDEX", "TROPONINI" in the last 168 hours. BNP: BNP (last 3 results) Recent Labs    12/05/22 1638 12/05/22 2337  BNP 662.0* 1,015.6*    ProBNP (last 3 results) No results for input(s): "PROBNP" in the last 8760 hours.  CBG: No results for input(s): "GLUCAP" in the last 168 hours.     Signed:  Zannie Cove MD.  [facility] 12/09/2022, 10:23 AM

## 2022-12-09 NOTE — TOC Initial Note (Signed)
Transition of Care [facility]) - Initial/Assessment Note    Patient Details  Name: Hannah Bullock MRN: [id number] Date of Birth: 1975-04-14  Transition of Care [facility]) CM/SW Contact:    Leone Haven, RN Phone Number: 12/09/2022, 8:49 AM  Clinical Narrative:                 NCM printed off Jardiance 10 copay card for patient, and gave her the 10 co pay for entresto.        Patient Goals and CMS Choice            Expected Discharge Plan and Services                                              Prior Living Arrangements/Services                       Activities of Daily Living   ADL Screening (condition at time of admission) Independently performs ADLs?: Yes (appropriate for developmental age) Is the patient deaf or have difficulty hearing?: No Does the patient have difficulty seeing, even when wearing glasses/contacts?: No Does the patient have difficulty concentrating, remembering, or making decisions?: No  Permission Sought/Granted                  Emotional Assessment              Admission diagnosis:  Sinus tachycardia [R00.0] Bilateral pleural effusion [J90] New onset of congestive heart failure (HCC) [I50.9] Acute congestive heart failure, unspecified heart failure type (HCC) [I50.9] Patient Active Problem List   Diagnosis Date Noted   New onset of congestive heart failure (HCC) 12/05/2022   Generalized anxiety disorder 12/05/2022   Essential hypertension 12/05/2022   Transaminitis 12/05/2022   Bronchitis 12/05/2022   CAP (community acquired pneumonia) 12/05/2022   Elevated troponin 12/05/2022   Alcohol withdrawal (HCC) 12/05/2022   Acute gastric ulcer with hemorrhage 04/06/2017   Acute blood loss anemia 04/06/2017   Alcohol abuse 04/06/2017   Tobacco abuse 04/06/2017   Syncope    Upper GI bleed    Coffee ground emesis    PCP:  Leilani Able, MD Pharmacy:   [phone] - [address] [place] - [address] [place] [address] Phone: [phone] Fax: [phone]  [facility] [address] [place] [address] Phone: [phone] Fax: [phone]     Social Determinants of Health (SDOH) Social History: SDOH Screenings   Food Insecurity: No Food Insecurity (12/07/2022)  Housing: Low Risk  (12/08/2022)  Transportation Needs: No Transportation Needs (12/07/2022)  Utilities: Not At Risk (12/07/2022)  Alcohol Screen: Low Risk  (12/08/2022)  Financial Resource Strain: Low Risk  (12/08/2022)  Tobacco Use: Medium Risk (12/05/2022)   SDOH Interventions: Housing Interventions: Intervention Not Indicated Financial Strain Interventions: Intervention Not Indicated   Readmission Risk Interventions     No data to display

## 2022-12-10 LAB — LIPOPROTEIN A (LPA): Lipoprotein (a): 125.1 nmol/L — ABNORMAL HIGH (ref <75.0)

## 2022-12-11 DIAGNOSIS — F900 Attention-deficit hyperactivity disorder, predominantly inattentive type: Secondary | ICD-10-CM | POA: Diagnosis not present

## 2022-12-11 DIAGNOSIS — F419 Anxiety disorder, unspecified: Secondary | ICD-10-CM | POA: Diagnosis not present

## 2022-12-11 LAB — CULTURE, BLOOD (ROUTINE X 2)
Culture: NO GROWTH
Culture: NO GROWTH
Special Requests: ADEQUATE
Special Requests: ADEQUATE

## 2022-12-16 ENCOUNTER — Ambulatory Visit (HOSPITAL_COMMUNITY)
Admit: 2022-12-16 | Discharge: 2022-12-16 | Disposition: A | Discharge status: Home / Self Care | Payer: 59 | Source: Ambulatory Visit | Attending: Cardiology | Admitting: Cardiology

## 2022-12-16 ENCOUNTER — Encounter (HOSPITAL_COMMUNITY): Payer: Self-pay

## 2022-12-16 VITALS — BP 116/84 | HR 101 | Wt 166.8 lb

## 2022-12-16 DIAGNOSIS — R059 Cough, unspecified: Secondary | ICD-10-CM | POA: Diagnosis not present

## 2022-12-16 DIAGNOSIS — I110 Hypertensive heart disease with heart failure: Secondary | ICD-10-CM | POA: Insufficient documentation

## 2022-12-16 DIAGNOSIS — Z79899 Other long term (current) drug therapy: Secondary | ICD-10-CM | POA: Insufficient documentation

## 2022-12-16 DIAGNOSIS — R0981 Nasal congestion: Secondary | ICD-10-CM | POA: Diagnosis not present

## 2022-12-16 DIAGNOSIS — I509 Heart failure, unspecified: Secondary | ICD-10-CM | POA: Diagnosis not present

## 2022-12-16 DIAGNOSIS — I428 Other cardiomyopathies: Secondary | ICD-10-CM | POA: Diagnosis not present

## 2022-12-16 DIAGNOSIS — F411 Generalized anxiety disorder: Secondary | ICD-10-CM | POA: Diagnosis not present

## 2022-12-16 DIAGNOSIS — F1090 Alcohol use, unspecified, uncomplicated: Secondary | ICD-10-CM | POA: Diagnosis not present

## 2022-12-16 DIAGNOSIS — I5022 Chronic systolic (congestive) heart failure: Secondary | ICD-10-CM | POA: Diagnosis not present

## 2022-12-16 DIAGNOSIS — Z87891 Personal history of nicotine dependence: Secondary | ICD-10-CM | POA: Diagnosis not present

## 2022-12-16 DIAGNOSIS — R0609 Other forms of dyspnea: Secondary | ICD-10-CM | POA: Diagnosis not present

## 2022-12-16 DIAGNOSIS — Z7984 Long term (current) use of oral hypoglycemic drugs: Secondary | ICD-10-CM | POA: Diagnosis not present

## 2022-12-16 LAB — BASIC METABOLIC PANEL
Anion gap: 9 (ref 5–15)
BUN: 5 mg/dL — ABNORMAL LOW (ref 6–20)
CO2: 24 mmol/L (ref 22–32)
Calcium: 9.9 mg/dL (ref 8.9–10.3)
Chloride: 104 mmol/L (ref 98–111)
Creatinine, Ser: 0.89 mg/dL (ref 0.44–1.00)
GFR, Estimated: >60 mL/min (ref >60)
Glucose, Bld: 106 mg/dL — ABNORMAL HIGH (ref 70–99)
Potassium: 4.3 mmol/L (ref 3.5–5.1)
Sodium: 137 mmol/L (ref 135–145)

## 2022-12-16 LAB — BRAIN NATRIURETIC PEPTIDE: B Natriuretic Peptide: 205.2 pg/mL — ABNORMAL HIGH (ref 0.0–100.0)

## 2022-12-16 MED ORDER — FUROSEMIDE 20 MG PO TABS: 20.0000 mg | ORAL_TABLET | ORAL | 0 refills | Status: DC | PRN

## 2022-12-16 MED ORDER — CARVEDILOL 3.125 MG PO TABS: 3.1250 mg | ORAL_TABLET | Freq: Two times a day (BID) | ORAL | 3 refills | Status: DC

## 2022-12-16 MED ORDER — ENTRESTO 49-51 MG PO TABS: 1.0000 | ORAL_TABLET | Freq: Two times a day (BID) | ORAL | 11 refills | Status: DC

## 2022-12-16 MED ORDER — SPIRONOLACTONE 25 MG PO TABS: 25.0000 mg | ORAL_TABLET | Freq: Every day | ORAL | 3 refills | Status: DC

## 2022-12-16 MED ORDER — EMPAGLIFLOZIN 10 MG PO TABS: 10.0000 mg | ORAL_TABLET | Freq: Every day | ORAL | 11 refills | Status: DC

## 2022-12-16 NOTE — Progress Notes (Signed)
HEART & VASCULAR TRANSITION OF CARE CONSULT NOTE     Referring Physician: Dr. Anne Fu Primary Care: Leilani Able, MD Primary Cardiologist: None  HPI: Referred to clinic by Dr. Anne Fu for heart failure consultation.   47 y/o female w/ h/o HTN, chronic ETOH use disorder, generalized anxiety disorder on Adderal, h/o gastric ulcers/GIB, tobacco use and recent URI infection, who recently presented to the ED 10/24 w/ complaints of increasing exertional dyspnea, dry cough and congestion. In the ED was noted to be tachycardic and hypertensive. EKG showed ST w/ PVC, 129 bpm. Initial BP 182/114. CTA of chest negative for PE but showed pulmonary vascular congestion. BNP 1,105. TSH WNL. ETOH level non detectable. Echo showed severely reduced LVEF 25-30%, global HK, LV mildly dilated, RV normal. She was diuresed w/ IV Lasix and placed on HF GDMT/ antihypertensive regimen. Underwent R/LHC which demonstrated no significant coronary disease, mildly elevated left and right heart filling pressures (RA/RVEDP 10 mmHg, PCWP 15 mmHg, LVEDP 18 mmHg and mild to moderately reduced Fick cardiac output/index (CO 3.6 L/min, CI 2.0 L/min/m^2). During the case, she was note to have transient hypotension "suspected to be due to combination of sedation and vasovagal response, successfully treated with normal saline and phenylephrine boluses". She was diuresed further and she was started all 4 pillars of goal-directed medical therapy, however it was noted that several dose reductions were made due to soft BP. She was referred to [facility] clinic at d/c. D/c wt was 167 lb.   She presents to clinic today for f/u. Feeling well. Breathing much improved. Not SOB w/ basic ADLs. Denies orthopnea/PND. No LEE. Wt today stable at 166 lb. Also stable at home. ReDs 30%. BP better controlled, 116/84. No orthostatic symptoms.   She reports her BP had previously been high in the past but was not on medications. Also reports h/o snorning.   She  admits to heavy ETOH use prior to diagnosis of HF but has cut consumption by  ~80%. 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  She is now only drinking ~1 shot a day. She has also cut down on caffeine intake. Was drinking several diet cokes daily previously. She is still on Adderal. She was a former heavy smoker, quit 6 years ago.    EKG today shows NSR 97 bpm.     Cardiac Testing   2D Echo 10/24 1. Left ventricular ejection fraction, by estimation, is 25 to 30%. The  left ventricle has severely decreased function. The left ventricle  demonstrates global hypokinesis. The left ventricular internal cavity size  was mildly dilated. Indeterminate  diastolic filling due to E-A fusion.   2. Right ventricular systolic function is normal. The right ventricular  size is normal. Tricuspid regurgitation signal is inadequate for assessing  PA pressure.   3. The mitral valve is normal in structure. Mild to moderate mitral valve  regurgitation.   4. The aortic valve was not well visualized. Aortic valve regurgitation  is not visualized. No aortic stenosis is present.   5. The inferior vena cava is normal in size with greater than 50%  respiratory variability, suggesting right atrial pressure of 3 mmHg.    LHC 10/24 Conclusions: No angiographically significant coronary artery disease. Upper normal to mildly elevated left and right heart filling pressures (RA/RVEDP 10 mmHg, PCWP 15 mmHg, LVEDP 18 mmHg). Mildly to moderately reduced Fick cardiac output/index (CO 3.6 L/min, CI 2.0 L/min/m^2). Transient hypotension suspected  to be due to combination of sedation and vasovagal response, successfully treated with normal saline and phenylephrine boluses.      Review of Systems: [y] = yes, [ ]  = no   General: Weight gain [ ] ; Weight loss [ ] ; Anorexia [ ] ; Fatigue [ ] ; Fever [ ] ; Chills [ ] ; Weakness [ ]   Cardiac: Chest pain/pressure [ ] ;  Resting SOB [ ] ; Exertional SOB [ ] ; Orthopnea [ ] ; Pedal Edema [ ] ; Palpitations [ ] ; Syncope [ ] ; Presyncope [ ] ; Paroxysmal nocturnal dyspnea[ ]   Pulmonary: Cough [ ] ; Wheezing[ ] ; Hemoptysis[ ] ; Sputum [ ] ; Snoring [Y ]  GI: Vomiting[ ] ; Dysphagia[ ] ; Melena[ ] ; Hematochezia [ ] ; Heartburn[ ] ; Abdominal pain [ ] ; Constipation [ ] ; Diarrhea [ ] ; BRBPR [ ]   GU: Hematuria[ ] ; Dysuria [ ] ; Nocturia[ ]   Vascular: Pain in legs with walking [ ] ; Pain in feet with lying flat [ ] ; Non-healing sores [ ] ; Stroke [ ] ; TIA [ ] ; Slurred speech [ ] ;  Neuro: Headaches[ ] ; Vertigo[ ] ; Seizures[ ] ; Paresthesias[ ] ;Blurred vision [ ] ; Diplopia [ ] ; Vision changes [ ]   Ortho/Skin: Arthritis [ ] ; Joint pain [ ] ; Muscle pain [ ] ; Joint swelling [ ] ; Back Pain [ ] ; Rash [ ]   Psych: Depression[ ] ; Anxiety[ ]   Heme: Bleeding problems [ ] ; Clotting disorders [ ] ; Anemia [ ]   Endocrine: Diabetes [ ] ; Thyroid dysfunction[ ]    Past Medical History:  Diagnosis Date   Anxiety    Medical history non-contributory     Current Outpatient Medications  Medication Sig Dispense Refill   albuterol (VENTOLIN HFA) 108 (90 Base) MCG/ACT inhaler Inhale 2 puffs into the lungs every 4 (four) hours as needed.     amphetamine-dextroamphetamine (ADDERALL XR) 20 MG 24 hr capsule Take 20 mg by mouth every morning.     amphetamine-dextroamphetamine (ADDERALL) 10 MG tablet Take 10 mg by mouth daily. Patient takes on hectic days, when she goes to school and work     carvedilol (COREG) 3.125 MG tablet Take 1 tablet (3.125 mg total) by mouth 2 (two) times daily with a meal. 60 tablet 0   Cholecalciferol (VITAMIN D-3 PO) Take 1 capsule by mouth daily.     DHEA 25 MG CAPS Take 1 capsule by mouth daily.     empagliflozin (JARDIANCE) 10 MG TABS tablet Take 1 tablet (10 mg total) by mouth daily. 30 tablet 0   estrogens, conjugated, (PREMARIN) 1.25 MG tablet Take 1.25 mg by mouth daily.     furosemide (LASIX) 20 MG tablet Take 1 tablet (20 mg  total) by mouth as needed for fluid or edema (For weight gain of 3 LB in 1 day or 5 LB in 1 week). 10 tablet 0   hydrOXYzine (ATARAX) 25 MG tablet Take 25-50 mg by mouth at bedtime.     loratadine (CLARITIN) 10 MG tablet Take 10 mg by mouth daily.     progesterone (PROMETRIUM) 100 MG capsule Take 100 mg by mouth daily.     sacubitril-valsartan (ENTRESTO) 24-26 MG Take 1 tablet by mouth 2 (two) times daily. 60 tablet 0   spironolactone (ALDACTONE) 25 MG tablet Take 0.5 tablets (12.5 mg total) by mouth daily. (Patient taking differently: Take 25 mg by mouth daily.) 30 tablet 0   No current facility-administered medications for this encounter.    Allergies  Allergen Reactions   Prednisone Rash      Social History   Socioeconomic History   Marital  status: Married    Spouse name: Less   Number of children: 0   Years of education: Not on file   Highest education level: High school graduate  Occupational History   Occupation: [employer]  Tobacco Use   Smoking status: Former    Current packs/day: 0.00    Types: Cigarettes    Quit date: 04/06/2016    Years since quitting: 6.6   Smokeless tobacco: Never  Vaping Use   Vaping status: Never Used  Substance and Sexual Activity   Alcohol use: Yes    Alcohol/week: 35.0 standard drinks of alcohol    Types: 35 Shots of liquor per week    Comment: 5 shots of bourbon per day   Drug use: No   Sexual activity: Yes  Other Topics Concern   Not on file  Social History Narrative   Not on file   Social Determinants of Health   Financial Resource Strain: Low Risk  (12/08/2022)   Overall Financial Resource Strain (CARDIA)    Difficulty of Paying Living Expenses: Not very hard  Food Insecurity: No Food Insecurity (12/07/2022)   Hunger Vital Sign    Worried About Running Out of Food in the Last Year: Never true    Ran Out of Food in the Last Year: Never true  Transportation Needs: No Transportation Needs (12/07/2022)   PRAPARE -  Administrator, Civil Service (Medical): No    Lack of Transportation (Non-Medical): No  Physical Activity: Not on file  Stress: Not on file  Social Connections: Not on file  Intimate Partner Violence: Not At Risk (12/07/2022)   Humiliation, Afraid, Rape, and Kick questionnaire    Fear of Current or Ex-Partner: No    Emotionally Abused: No    Physically Abused: No    Sexually Abused: No      Family History  Problem Relation Age of Onset   Heart disease Father    Heart disease Paternal Grandfather     Vitals:   12/16/22 1115  BP: 116/84  Pulse: (!) 101  SpO2: 97%  Weight: 75.7 kg (166 lb 12.8 oz)    PHYSICAL EXAM: ReDs 30%  General:  Well appearing. No respiratory difficulty HEENT: normal Neck: supple. no JVD. Carotids 2+ bilat; no bruits. No lymphadenopathy or thryomegaly appreciated. Cor: PMI nondisplaced. Regular rate & rhythm. No rubs, gallops or murmurs. Lungs: clear Abdomen: soft, nontender, nondistended. No hepatosplenomegaly. No bruits or masses. Good bowel sounds. Extremities: no cyanosis, clubbing, rash, edema Neuro: alert & oriented x 3, cranial nerves grossly intact. moves all 4 extremities w/o difficulty. Affect pleasant.  ECG: NSR 97 bpm   ASSESSMENT & PLAN:  1. Chronic Systolic Heart Failure - Echo 54/09 EF 25-30%, RV nl - NICM. LHC 10/24 no significant CAD - Etiology uncertain. Likely differential include ETOH mediated +/- Hypertensive Heart Disease (BP markedly elevated on admit) +/- viral (recent proceeding URI symptoms prior to HF symptoms). She has reduced ETOH consumption significantly by ~80% (was drinking ~8 shots of bourbon daily). Her BP is also now under good control  - recommend continued reduction of ETOH consumption + gradual titration of GDMT. If EF not improving in 3 months, would recommend a cMRI  - NYHA Class I-II. Euvolemic on exam and by ReDs 30% - Increase Entersto to 49-51 mg bid - Continue Jardiance 10 mg daily. No GU  symptoms - Continue Spironolactone 25 mg daily - Continue Coreg 3.125 mg bid - Continue Lasix 20 mg PRN - check  BMP/BNP today   2. Hypertension  - controlled on current regimen - GDMT titration per above - check BMP  - ? Underlying OSA (h/o snoring), recommend sleep study evaluation   3. ETOH Use Disorder  - suspect contributing to CM - she has cut back significantly and congratulated on efforts. Imperative that she continue reduce intake     Referred to HFSW (PCP, Medications, Transportation, ETOH Abuse, Drug Abuse, Insurance, Financial ): No  Refer to Pharmacy: No Refer to Home Health: No Refer to Advanced Heart Failure Clinic: No (if still doing well at next f/u, ok to refer back to gen cards) Refer to General Cardiology: Yes   Follow up: will see back for 1 more visit to reassess functional status, volume and help w/ GDMT titration. Suspect her HF is likely ETOH meditated +/- hypertensive CM, both of which can be reversible w/ lifestyle change + medication.  Expect EF to hopefully improve. If stable at f/u, will refer to gen cards for further management for now.   Robbie Lis, PA-C 12/16/2022

## 2022-12-16 NOTE — Patient Instructions (Signed)
INCREASE Entresto to 49/51 mg Twice daily  Labs done today, your results will be available in MyChart, we will contact you for abnormal readings.   Thank you for allowing [place] to provider your heart failure care after your recent hospitalization. Please follow-up in 2 weeks.  If you have any questions, issues, or concerns before your next appointment please call our office at [phone], opt. 2 and leave a message for the triage nurse.

## 2022-12-16 NOTE — Progress Notes (Signed)
ReDS Vest / Clip - 12/16/22 1115       ReDS Vest / Clip   Station Marker A    Ruler Value 34    ReDS Value Range Low volume    ReDS Actual Value 30

## 2022-12-23 ENCOUNTER — Other Ambulatory Visit: Payer: Self-pay | Admitting: Family Medicine

## 2022-12-23 DIAGNOSIS — D259 Leiomyoma of uterus, unspecified: Secondary | ICD-10-CM

## 2022-12-23 DIAGNOSIS — R102 Pelvic and perineal pain: Secondary | ICD-10-CM

## 2022-12-24 ENCOUNTER — Ambulatory Visit
Admission: RE | Admit: 2022-12-24 | Discharge: 2022-12-24 | Disposition: A | Discharge status: Home / Self Care | Payer: 59 | Source: Ambulatory Visit | Attending: Family Medicine | Admitting: Family Medicine

## 2022-12-24 DIAGNOSIS — R102 Pelvic and perineal pain: Secondary | ICD-10-CM

## 2022-12-24 DIAGNOSIS — D259 Leiomyoma of uterus, unspecified: Secondary | ICD-10-CM

## 2022-12-25 ENCOUNTER — Other Ambulatory Visit: Payer: Self-pay | Admitting: Family Medicine

## 2022-12-25 ENCOUNTER — Ambulatory Visit
Admission: RE | Admit: 2022-12-25 | Discharge: 2022-12-25 | Disposition: A | Discharge status: Home / Self Care | Payer: 59 | Source: Ambulatory Visit | Attending: Family Medicine | Admitting: Family Medicine

## 2022-12-25 DIAGNOSIS — N852 Hypertrophy of uterus: Secondary | ICD-10-CM | POA: Diagnosis not present

## 2022-12-25 DIAGNOSIS — D259 Leiomyoma of uterus, unspecified: Secondary | ICD-10-CM | POA: Diagnosis not present

## 2022-12-25 DIAGNOSIS — R102 Pelvic and perineal pain: Secondary | ICD-10-CM

## 2022-12-31 ENCOUNTER — Encounter (HOSPITAL_COMMUNITY): Payer: Self-pay

## 2022-12-31 ENCOUNTER — Ambulatory Visit (HOSPITAL_COMMUNITY)
Admission: RE | Admit: 2022-12-31 | Discharge: 2022-12-31 | Disposition: A | Discharge status: Home / Self Care | Payer: 59 | Source: Ambulatory Visit | Attending: Cardiology | Admitting: Cardiology

## 2022-12-31 VITALS — BP 120/84 | HR 104 | Wt 165.8 lb

## 2022-12-31 DIAGNOSIS — I5022 Chronic systolic (congestive) heart failure: Secondary | ICD-10-CM | POA: Diagnosis not present

## 2022-12-31 DIAGNOSIS — I110 Hypertensive heart disease with heart failure: Secondary | ICD-10-CM | POA: Insufficient documentation

## 2022-12-31 DIAGNOSIS — I428 Other cardiomyopathies: Secondary | ICD-10-CM | POA: Insufficient documentation

## 2022-12-31 DIAGNOSIS — Z7984 Long term (current) use of oral hypoglycemic drugs: Secondary | ICD-10-CM | POA: Diagnosis not present

## 2022-12-31 DIAGNOSIS — R0683 Snoring: Secondary | ICD-10-CM | POA: Diagnosis not present

## 2022-12-31 DIAGNOSIS — R000 Tachycardia, unspecified: Secondary | ICD-10-CM | POA: Diagnosis not present

## 2022-12-31 DIAGNOSIS — Z87891 Personal history of nicotine dependence: Secondary | ICD-10-CM | POA: Insufficient documentation

## 2022-12-31 DIAGNOSIS — Z8711 Personal history of peptic ulcer disease: Secondary | ICD-10-CM | POA: Insufficient documentation

## 2022-12-31 DIAGNOSIS — F1090 Alcohol use, unspecified, uncomplicated: Secondary | ICD-10-CM | POA: Insufficient documentation

## 2022-12-31 DIAGNOSIS — F419 Anxiety disorder, unspecified: Secondary | ICD-10-CM | POA: Diagnosis not present

## 2022-12-31 DIAGNOSIS — Z79899 Other long term (current) drug therapy: Secondary | ICD-10-CM | POA: Insufficient documentation

## 2022-12-31 LAB — BASIC METABOLIC PANEL
Anion gap: 8 (ref 5–15)
BUN: 7 mg/dL (ref 6–20)
CO2: 25 mmol/L (ref 22–32)
Calcium: 9.5 mg/dL (ref 8.9–10.3)
Chloride: 104 mmol/L (ref 98–111)
Creatinine, Ser: 0.96 mg/dL (ref 0.44–1.00)
GFR, Estimated: >60 mL/min (ref >60)
Glucose, Bld: 107 mg/dL — ABNORMAL HIGH (ref 70–99)
Potassium: 3.8 mmol/L (ref 3.5–5.1)
Sodium: 137 mmol/L (ref 135–145)

## 2022-12-31 LAB — BRAIN NATRIURETIC PEPTIDE: B Natriuretic Peptide: 84.3 pg/mL (ref 0.0–100.0)

## 2022-12-31 MED ORDER — DIGOXIN 125 MCG PO TABS: 0.1250 mg | ORAL_TABLET | Freq: Every day | ORAL | 3 refills | Status: DC

## 2022-12-31 NOTE — Patient Instructions (Signed)
Medication Changes:  START: DIGOXIN 0.125MG  ONCE DAILY   Lab Work:  Labs done today, your results will be available in MyChart, we will contact you for abnormal readings.  Follow-Up in: 2 weeks WITH DR. Elwyn Lade AS SCHEDULED 12/4 AT 9:40AM  At the [facility], you and your health needs are our priority. We have a designated team specialized in the treatment of Heart Failure. This Care Team includes your primary Heart Failure Specialized Cardiologist (physician), Advanced Practice Providers (APPs- Physician Assistants and Nurse Practitioners), and Pharmacist who all work together to provide you with the care you need, when you need it.   You may see any of the following providers on your designated Care Team at your next follow up:  Dr. Arvilla Meres Dr. Marca Ancona Dr. Dorthula Nettles Dr. Theresia Bough Tonye Becket, NP Robbie Lis, [place] [facility] [place], [place] Brynda Peon, NP [place] Lee, NP Karle Plumber, PharmD   Please be sure to bring in all your medications bottles to every appointment.   Need to Contact us:  If you have any questions or concerns before your next appointment please send [place] a message through [place] or call our office at [phone].    TO LEAVE A MESSAGE FOR THE NURSE SELECT OPTION 2, PLEASE LEAVE A MESSAGE INCLUDING: YOUR NAME DATE OF BIRTH CALL BACK NUMBER REASON FOR CALL**this is important as we prioritize the call backs  YOU WILL RECEIVE A CALL BACK THE SAME DAY AS LONG AS YOU CALL BEFORE 4:00 PM

## 2022-12-31 NOTE — Progress Notes (Signed)
HEART & VASCULAR TRANSITION OF CARE PROGRESS NOTE     Referring Physician: Dr. Anne Fu Primary Care: Leilani Able, MD Primary Cardiologist: None  HPI: Referred to clinic by Dr. Anne Fu for heart failure consultation.   47 y/o female w/ h/o HTN, chronic ETOH use disorder, generalized anxiety disorder on Adderal, h/o gastric ulcers/GIB, tobacco use and recent URI infection, who recently presented to the ED 10/24 w/ complaints of increasing exertional dyspnea, dry cough and congestion. In the ED was noted to be tachycardic and hypertensive. EKG showed ST w/ PVC, 129 bpm. Initial BP 182/114. CTA of chest negative for PE but showed pulmonary vascular congestion. BNP 1,105. TSH WNL. ETOH level non detectable. Echo showed severely reduced LVEF 25-30%, global HK, LV mildly dilated, RV normal. She was diuresed w/ IV Lasix and placed on HF GDMT/ antihypertensive regimen. Underwent R/LHC which demonstrated no significant coronary disease, mildly elevated left and right heart filling pressures (RA/RVEDP 10 mmHg, PCWP 15 mmHg, LVEDP 18 mmHg and mild to moderately reduced Fick cardiac output/index (CO 3.6 L/min, CI 2.0 L/min/m^2). During the case, she was note to have transient hypotension "suspected to be due to combination of sedation and vasovagal response, successfully treated with normal saline and phenylephrine boluses". She was diuresed further and she was started all 4 pillars of goal-directed medical therapy, however it was noted that several dose reductions were made due to soft BP. She was referred to [facility] clinic at d/c. D/c wt was 167 lb.   She was doing well at initial TOC visit. Breathing much improved. Not SOB w/ basic ADLs. Denied orthopnea/PND. No LEE. Wt stable at 166 lb.  ReDs was 30%. BP better controlled, 116/84. Entresto dose was increased.   She presents back today for f/u. Has been feeling anxious and nervous. Notes chronic anxiety. Getting around ok w/ ALDs. Busy on her job. Not SOB  while working. Reports full med compliance. Notes some positional dizziness but denies syncope/near syncope. Wt has been stable. No LEE. EKG today shows sinus tach 111 bpm.  She is still on Adderal, takes daily.   She admits to heavy ETOH use prior to diagnosis of HF but has cut consumption some. 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 She says she has total of about 4 drinks/ day, mainly red wine and bourbon. She has also cut down on caffeine intake. Was drinking several diet cokes daily previously. She was a former heavy smoker, quit 6 years ago.    EKG today shows sinus tach 111 bpm.     Cardiac Testing   2D Echo 10/24 1. Left ventricular ejection fraction, by estimation, is 25 to 30%. The  left ventricle has severely decreased function. The left ventricle  demonstrates global hypokinesis. The left ventricular internal cavity size  was mildly dilated. Indeterminate  diastolic filling due to E-A fusion.   2. Right ventricular systolic function is normal. The right ventricular  size is normal. Tricuspid regurgitation signal is inadequate for assessing  PA pressure.   3. The mitral valve is normal in structure. Mild to moderate mitral valve  regurgitation.   4. The aortic valve was not well visualized. Aortic valve regurgitation  is not visualized. No aortic stenosis is present.   5. The inferior vena cava is normal in size with greater than 50%  respiratory variability, suggesting right atrial pressure of 3 mmHg.    LHC 10/24 Conclusions: No angiographically significant coronary artery  disease. Upper normal to mildly elevated left and right heart filling pressures (RA/RVEDP 10 mmHg, PCWP 15 mmHg, LVEDP 18 mmHg). Mildly to moderately reduced Fick cardiac output/index (CO 3.6 L/min, CI 2.0 L/min/m^2). Transient hypotension suspected to be due to combination of sedation and vasovagal response, successfully treated with  normal saline and phenylephrine boluses.      Review of Systems: [y] = yes, [ ]  = no   General: Weight gain [ ] ; Weight loss [ ] ; Anorexia [ ] ; Fatigue [ ] ; Fever [ ] ; Chills [ ] ; Weakness [ ]   Cardiac: Chest pain/pressure [ ] ; Resting SOB [ ] ; Exertional SOB [ ] ; Orthopnea [ ] ; Pedal Edema [ ] ; Palpitations [ ] ; Syncope [ ] ; Presyncope [ ] ; Paroxysmal nocturnal dyspnea[ ]   Pulmonary: Cough [ ] ; Wheezing[ ] ; Hemoptysis[ ] ; Sputum [ ] ; Snoring [Y ]  GI: Vomiting[ ] ; Dysphagia[ ] ; Melena[ ] ; Hematochezia [ ] ; Heartburn[ ] ; Abdominal pain [ ] ; Constipation [ ] ; Diarrhea [ ] ; BRBPR [ ]   GU: Hematuria[ ] ; Dysuria [ ] ; Nocturia[ ]   Vascular: Pain in legs with walking [ ] ; Pain in feet with lying flat [ ] ; Non-healing sores [ ] ; Stroke [ ] ; TIA [ ] ; Slurred speech [ ] ;  Neuro: Headaches[ ] ; Vertigo[ ] ; Seizures[ ] ; Paresthesias[ ] ;Blurred vision [ ] ; Diplopia [ ] ; Vision changes [ ]   Ortho/Skin: Arthritis [ ] ; Joint pain [ ] ; Muscle pain [ ] ; Joint swelling [ ] ; Back Pain [ ] ; Rash [ ]   Psych: Depression[ ] ; Anxiety[Y ]  Heme: Bleeding problems [ ] ; Clotting disorders [ ] ; Anemia [ ]   Endocrine: Diabetes [ ] ; Thyroid dysfunction[ ]    Past Medical History:  Diagnosis Date   Anxiety    Medical history non-contributory     Current Outpatient Medications  Medication Sig Dispense Refill   albuterol (VENTOLIN HFA) 108 (90 Base) MCG/ACT inhaler Inhale 2 puffs into the lungs every 4 (four) hours as needed.     amphetamine-dextroamphetamine (ADDERALL XR) 20 MG 24 hr capsule Take 20 mg by mouth every morning.     amphetamine-dextroamphetamine (ADDERALL) 10 MG tablet Take 10 mg by mouth daily. Patient takes on hectic days, when she goes to school and work     carvedilol (COREG) 3.125 MG tablet Take 1 tablet (3.125 mg total) by mouth 2 (two) times daily with a meal. 180 tablet 3   Cholecalciferol (VITAMIN D-3 PO) Take 1 capsule by mouth daily.     DHEA 25 MG CAPS Take 1 capsule by mouth daily.      digoxin (LANOXIN) 0.125 MG tablet Take 1 tablet (0.125 mg total) by mouth daily. 30 tablet 3   empagliflozin (JARDIANCE) 10 MG TABS tablet Take 1 tablet (10 mg total) by mouth daily. 30 tablet 11   estrogens, conjugated, (PREMARIN) 1.25 MG tablet Take 1.25 mg by mouth daily.     furosemide (LASIX) 20 MG tablet Take 1 tablet (20 mg total) by mouth as needed for fluid or edema (For weight gain of 3 LB in 1 day or 5 LB in 1 week). 10 tablet 0   hydrOXYzine (ATARAX) 25 MG tablet Take 25-50 mg by mouth at bedtime.     loratadine (CLARITIN) 10 MG tablet Take 10 mg by mouth daily.     progesterone (PROMETRIUM) 100 MG capsule Take 100 mg by mouth daily.     sacubitril-valsartan (ENTRESTO) 49-51 MG Take 1 tablet by mouth 2 (two) times daily. 60 tablet 11   spironolactone (ALDACTONE) 25 MG  tablet Take 1 tablet (25 mg total) by mouth daily. 90 tablet 3   No current facility-administered medications for this encounter.    Allergies  Allergen Reactions   Prednisone Rash      Social History   Socioeconomic History   Marital status: Married    Spouse name: Less   Number of children: 0   Years of education: Not on file   Highest education level: High school graduate  Occupational History   Occupation: [employer]  Tobacco Use   Smoking status: Former    Current packs/day: 0.00    Types: Cigarettes    Quit date: 04/06/2016    Years since quitting: 6.7   Smokeless tobacco: Never  Vaping Use   Vaping status: Never Used  Substance and Sexual Activity   Alcohol use: Yes    Alcohol/week: 35.0 standard drinks of alcohol    Types: 35 Shots of liquor per week    Comment: 5 shots of bourbon per day   Drug use: No   Sexual activity: Yes  Other Topics Concern   Not on file  Social History Narrative   Not on file   Social Determinants of Health   Financial Resource Strain: Low Risk  (12/08/2022)   Overall Financial Resource Strain (CARDIA)    Difficulty of Paying Living Expenses: Not very  hard  Food Insecurity: No Food Insecurity (12/07/2022)   Hunger Vital Sign    Worried About Running Out of Food in the Last Year: Never true    Ran Out of Food in the Last Year: Never true  Transportation Needs: No Transportation Needs (12/07/2022)   PRAPARE - Administrator, Civil Service (Medical): No    Lack of Transportation (Non-Medical): No  Physical Activity: Not on file  Stress: Not on file  Social Connections: Not on file  Intimate Partner Violence: Not At Risk (12/07/2022)   Humiliation, Afraid, Rape, and Kick questionnaire    Fear of Current or Ex-Partner: No    Emotionally Abused: No    Physically Abused: No    Sexually Abused: No      Family History  Problem Relation Age of Onset   Heart disease Father    Heart disease Paternal Grandfather     Vitals:   12/31/22 1139  BP: 120/84  Pulse: (!) 104  SpO2: 100%  Weight: 75.2 kg (165 lb 12.8 oz)     PHYSICAL EXAM: General:  Well appearing. No respiratory difficulty HEENT: normal Neck: supple. no JVD. Carotids 2+ bilat; no bruits. No lymphadenopathy or thyromegaly appreciated. Cor: PMI nondisplaced. Regular rate & rhythm. No rubs, gallops or murmurs. Lungs: clear Abdomen: soft, nontender, nondistended. No hepatosplenomegaly. No bruits or masses. Good bowel sounds. Extremities: no cyanosis, clubbing, rash, edema Neuro: alert & oriented x 3, cranial nerves grossly intact. moves all 4 extremities w/o difficulty. Affect pleasant.  ECG: sinus tachycardia 111 bpm    ASSESSMENT & PLAN:  1. Chronic Systolic Heart Failure - Echo 16/10 EF 25-30%, RV nl - NICM. LHC 10/24 no significant CAD - Etiology uncertain. Likely differential include ETOH mediated +/- Hypertensive Heart Disease (BP markedly elevated on admit) +/- viral (recent proceeding URI symptoms prior to HF symptoms). She has reduced ETOH consumption but still drinking a significant amount. Her BP is also now under good control  - recommend  continued reduction of ETOH consumption + gradual titration of GDMT. If EF not improving in 3 months, would recommend a cMRI  - NYHA Class II.  Euvolemic on exam. Tachycardic. BP normotensive  - Continue Entersto to 49-51 mg bid. No dose titration w/ recent dizziness  - Continue Jardiance 10 mg daily. No GU symptoms - Continue Spironolactone 25 mg daily - Continue Coreg 3.125 mg bid. No dose titration w/ marginal output on RHC - add digoxin 0.125 mg daily  - If HR still elevated at f/u visit, will plan to add corlanor  - Continue Lasix 20 mg PRN - check BMP/BNP today  - discuss importance of avoiding pregnancy   2. Hypertension  - controlled on current regimen - GDMT titration per above - check BMP  - ? Underlying OSA (h/o snoring), recommend sleep study evaluation   3. ETOH Use Disorder  - suspect contributing to CM - she has cut back but still drinking a significant amount. Imperative that she continue reduce intake     Referred to HFSW (PCP, Medications, Transportation, ETOH Abuse, Drug Abuse, Insurance, Financial ): No  Refer to Pharmacy: No Refer to Home Health: No Refer to Advanced Heart Failure Clinic: Yes (Dr. Elwyn Lade)  Refer to General Cardiology: already followed   Follow up: refer to the [facility], assign to Dr. Elwyn Lade. RTC in 2 wks    Robbie Lis, PA-C 12/31/2022

## 2023-01-01 ENCOUNTER — Telehealth (HOSPITAL_COMMUNITY): Payer: Self-pay | Admitting: Cardiology

## 2023-01-01 DIAGNOSIS — I5022 Chronic systolic (congestive) heart failure: Secondary | ICD-10-CM

## 2023-01-01 NOTE — Telephone Encounter (Signed)
Pt returned call and reports understanding Labs 11/27

## 2023-01-01 NOTE — Telephone Encounter (Signed)
-----   Message from [place] sent at 01/01/2023  1:38 PM EST ----- Regarding: Check Dig Level Pt started on Digoxin 11/20. Needs f/u BMP and dig level in 1 wk. Please arrange and notify pt

## 2023-01-07 ENCOUNTER — Ambulatory Visit (HOSPITAL_COMMUNITY)
Admission: RE | Admit: 2023-01-07 | Discharge: 2023-01-07 | Disposition: A | Discharge status: Home / Self Care | Payer: 59 | Source: Ambulatory Visit | Attending: Cardiology | Admitting: Cardiology

## 2023-01-07 DIAGNOSIS — I5022 Chronic systolic (congestive) heart failure: Secondary | ICD-10-CM | POA: Insufficient documentation

## 2023-01-07 LAB — BASIC METABOLIC PANEL
Anion gap: 10 (ref 5–15)
BUN: 11 mg/dL (ref 6–20)
CO2: 24 mmol/L (ref 22–32)
Calcium: 9.8 mg/dL (ref 8.9–10.3)
Chloride: 105 mmol/L (ref 98–111)
Creatinine, Ser: 1.14 mg/dL — ABNORMAL HIGH (ref 0.44–1.00)
GFR, Estimated: >60 mL/min (ref >60)
Glucose, Bld: 174 mg/dL — ABNORMAL HIGH (ref 70–99)
Potassium: 3.9 mmol/L (ref 3.5–5.1)
Sodium: 139 mmol/L (ref 135–145)

## 2023-01-07 LAB — DIGOXIN LEVEL: Digoxin Level: 1.2 ng/mL (ref 0.8–2.0)

## 2023-01-13 ENCOUNTER — Telehealth (HOSPITAL_COMMUNITY): Payer: Self-pay | Admitting: Cardiology

## 2023-01-13 NOTE — Telephone Encounter (Signed)
Called patient at [phone] to remind patient of appointment with Dr. Elwyn Lade on Wednesday, January 14, 2023. Front office left a voicemail / reminder for patient on telephone.

## 2023-01-14 ENCOUNTER — Encounter (HOSPITAL_COMMUNITY): Payer: Self-pay | Admitting: Cardiology

## 2023-01-14 ENCOUNTER — Ambulatory Visit (HOSPITAL_COMMUNITY)
Admission: RE | Admit: 2023-01-14 | Discharge: 2023-01-14 | Disposition: A | Discharge status: Home / Self Care | Payer: 59 | Source: Ambulatory Visit | Attending: Cardiology | Admitting: Cardiology

## 2023-01-14 VITALS — BP 140/90 | HR 100 | Wt 165.8 lb

## 2023-01-14 DIAGNOSIS — Z79899 Other long term (current) drug therapy: Secondary | ICD-10-CM | POA: Insufficient documentation

## 2023-01-14 DIAGNOSIS — Z7984 Long term (current) use of oral hypoglycemic drugs: Secondary | ICD-10-CM | POA: Insufficient documentation

## 2023-01-14 DIAGNOSIS — F1010 Alcohol abuse, uncomplicated: Secondary | ICD-10-CM | POA: Insufficient documentation

## 2023-01-14 DIAGNOSIS — I5022 Chronic systolic (congestive) heart failure: Secondary | ICD-10-CM | POA: Insufficient documentation

## 2023-01-14 DIAGNOSIS — I428 Other cardiomyopathies: Secondary | ICD-10-CM | POA: Diagnosis not present

## 2023-01-14 DIAGNOSIS — R000 Tachycardia, unspecified: Secondary | ICD-10-CM | POA: Diagnosis not present

## 2023-01-14 DIAGNOSIS — I110 Hypertensive heart disease with heart failure: Secondary | ICD-10-CM | POA: Insufficient documentation

## 2023-01-14 MED ORDER — CARVEDILOL 6.25 MG PO TABS: 6.2500 mg | ORAL_TABLET | Freq: Two times a day (BID) | ORAL | 11 refills | Status: DC

## 2023-01-14 NOTE — Progress Notes (Signed)
   ADVANCED HEART FAILURE NEW PATIENT CLINIC NOTE  Referring Physician: Leilani Able, MD  Primary Care: Leilani Able, MD Primary Cardiologist:  HPI: Hannah Bullock is a 47 y.o. female with a PMH of HTN, chronic ETOH use disorder, generalized anxiety disorder on Adderal, h/o gastric ulcers/GIB, tobacco use and recent URI infection, recently diagnosed HFrEF who presents for initial visit for further evaluation and treatment of heart failure/cardiomyopathy.      presented to the ED 10/24 w/ complaints of increasing exertional dyspnea, dry cough and congestion. In the ED was noted to be tachycardic and hypertensive.  Echo showed severely reduced LVEF 25-30%, global HK, LV mildly dilated, RV normal. She was diuresed w/ IV Lasix and placed on HF GDMT/ antihypertensive regimen. Underwent R/LHC which demonstrated no significant coronary disease, mildly elevated left and right heart filling pressures (RA/RVEDP 10 mmHg, PCWP 15 mmHg, LVEDP 18 mmHg and mild to moderately reduced Fick cardiac output/index (CO 3.6 L/min, CI 2.0 L/min/m^2). During the case, she was note to have transient hypotension "suspected to be due to combination of sedation and vasovagal response, successfully treated with normal saline and phenylephrine boluses". She was diuresed further and she was started all 4 pillars of goal-directed medical therapy, however it was noted that several dose reductions were made due to soft BP. She was referred to [facility] clinic at d/c. D/c wt was 167 lb.       SUBJECTIVE:   PMH, current medications, allergies, social history, and family history reviewed in epic.  PHYSICAL EXAM: There were no vitals filed for this visit. GENERAL: Well nourished and in no apparent distress at rest.  HEENT: The mucous membranes are pink and moist.   PULM:  Normal work of breathing, clear to auscultation bilaterally. Respirations are unlabored.  CARDIAC:  JVP: ***         Normal rate with regular rhythm. No murmurs,  rubs or gallops.  *** edema.  ABDOMEN: Soft, non-tender, non-distended. NEUROLOGIC: Patient is oriented x3 with no focal or lateralizing neurologic deficits.  PSYCH: Patients affect is appropriate, there is no evidence of anxiety or depression.  SKIN: Warm and dry; no lesions or wounds. Warm and well perfused extremities.  DATA REVIEW  ECG: ***    ECHO: ***  CATH: ***    Heart failure review: - Classification: {HFCLASS:30917} - Etiology: {Cardiomyopathy:30918} - NYHA Class:  - Volume status: {volumechf:30919} - ACEi/ARB/ARNI: {HF:30752} - Aldosterone antagonist: {HF:30752} - Beta-blocker: {HF:30752} - Digoxin: {HF:30752} - Hydralazine/Nitrates: {HF:30752} - SGLT2i: {HF:30752} - GLP-1: {GLP:30906} - Advanced therapies: {Advancedtherapies:30916} - ICD: {ICD:30901}   ASSESSMENT & PLAN:  ***    Hannah Hasten, MD Advanced Heart Failure Mechanical Circulatory Support 01/14/23

## 2023-01-14 NOTE — Patient Instructions (Addendum)
INCREASE Carvedilol to 6.25 mg Twice daily  You have been referred to Cardiac rehab. They will call you to arrange your appointment.   Your physician recommends that you schedule a follow-up appointment in:  6 weeks. PLEASE CALL THE OFFICE THE END OF DECEMBER TO ARRANGE YOUR FOLLOW UP APPOINTMENT.** If you wish you may see him at our [place] office. To arrange your follow up  there you can call [phone].  If you have any questions or concerns before your next appointment please send Hannah Bullock a message through [place] or call our office at [phone].    TO LEAVE A MESSAGE FOR THE NURSE SELECT OPTION 2, PLEASE LEAVE A MESSAGE INCLUDING: YOUR NAME DATE OF BIRTH CALL BACK NUMBER REASON FOR CALL**this is important as we prioritize the call backs  YOU WILL RECEIVE A CALL BACK THE SAME DAY AS LONG AS YOU CALL BEFORE 4:00 PM  At the Advanced [facility] Failure Clinic, you and your health needs are our priority. As part of our continuing mission to provide you with exceptional heart care, we have created designated Provider Care Teams. These Care Teams include your primary Cardiologist (physician) and Advanced Practice Providers (APPs- Physician Assistants and Nurse Practitioners) who all work together to provide you with the care you need, when you need it.   You may see any of the following providers on your designated Care Team at your next follow up: Dr Arvilla Meres Dr Marca Ancona Dr. Dorthula Nettles Dr. Clearnce Hasten Amy Filbert Schilder, NP Robbie Lis, [place] [facility] [place], [place] Brynda Peon, NP [place] Lee, NP Karle Plumber, PharmD   Please be sure to bring in all your medications bottles to every appointment.    Thank you for choosing [facility]

## 2023-01-15 ENCOUNTER — Telehealth (HOSPITAL_COMMUNITY): Payer: Self-pay

## 2023-01-15 ENCOUNTER — Telehealth (HOSPITAL_COMMUNITY): Payer: Self-pay | Admitting: Cardiology

## 2023-01-15 DIAGNOSIS — I5022 Chronic systolic (congestive) heart failure: Secondary | ICD-10-CM

## 2023-01-15 MED ORDER — DIGOXIN 125 MCG PO TABS: 0.0625 mg | ORAL_TABLET | Freq: Every day | ORAL | 3 refills | Status: DC

## 2023-01-15 NOTE — Addendum Note (Signed)
Encounter addended by: Suezanne Cheshire, RN on: 01/15/2023 11:21 AM  Actions taken: Order Reconciliation Section accessed

## 2023-01-15 NOTE — Telephone Encounter (Signed)
-----   Message from Alen Bleacher sent at 01/09/2023  7:16 AM EST ----- Please place order for repeat digoxin level. Cut back dig to 0.0625 daily.   Let her know to NOT take her digoxin the morning of repeat labs.

## 2023-01-15 NOTE — Telephone Encounter (Signed)
Attempted to reach patient about medication change and lab work needed for next week

## 2023-01-15 NOTE — Telephone Encounter (Signed)
Patient called.  Patient aware.  

## 2023-01-21 ENCOUNTER — Telehealth (HOSPITAL_COMMUNITY): Payer: Self-pay

## 2023-01-21 ENCOUNTER — Encounter (HOSPITAL_COMMUNITY): Payer: Self-pay

## 2023-01-21 ENCOUNTER — Ambulatory Visit (HOSPITAL_COMMUNITY)
Admission: RE | Admit: 2023-01-21 | Discharge: 2023-01-21 | Disposition: A | Discharge status: Home / Self Care | Payer: 59 | Source: Ambulatory Visit | Attending: Cardiology | Admitting: Cardiology

## 2023-01-21 DIAGNOSIS — I5022 Chronic systolic (congestive) heart failure: Secondary | ICD-10-CM | POA: Insufficient documentation

## 2023-01-21 LAB — DIGOXIN LEVEL: Digoxin Level: 0.4 ng/mL — ABNORMAL LOW (ref 0.8–2.0)

## 2023-01-21 NOTE — Telephone Encounter (Signed)
Office Referral received. Verified MD signature.

## 2023-01-21 NOTE — Telephone Encounter (Signed)
Called patient to see if she was interested in participating in the Cardiac Rehab Program. Patient stated yes. Patient will come in for orientation on 12/16@130  and will attend the 145 exercise class.  Pensions consultant.

## 2023-01-22 ENCOUNTER — Telehealth (HOSPITAL_COMMUNITY): Payer: Self-pay

## 2023-01-22 NOTE — Telephone Encounter (Signed)
  Called pt to confirm appt for 01/26/23 at 1330. Gave pt instructions for appt, what to wear, office address, eating/taking meds before, and if sick to call and reschedule. Pt voiced understanding, all questions answered.   Health history completed? Yes   Jonna Coup, MS, ACSM-CEP 01/22/2023 3:43 PM

## 2023-01-26 ENCOUNTER — Encounter (HOSPITAL_COMMUNITY)
Admission: RE | Admit: 2023-01-26 | Discharge: 2023-01-26 | Disposition: A | Discharge status: Home / Self Care | Payer: 59 | Source: Ambulatory Visit | Attending: Cardiology | Admitting: Cardiology

## 2023-01-26 VITALS — BP 122/84 | HR 95 | Ht 64.0 in | Wt 166.2 lb

## 2023-01-26 DIAGNOSIS — I5022 Chronic systolic (congestive) heart failure: Secondary | ICD-10-CM

## 2023-01-26 NOTE — Progress Notes (Signed)
Cardiac Individual Treatment Plan  Patient Details  Name: Hannah Bullock MRN: [id number] Date of Birth: Jan 07, 1976 Referring Provider:   Flowsheet Row INTENSIVE CARDIAC REHAB ORIENT from 01/26/2023 in [facility]  Referring Provider Clearnce Hasten, MD       Initial Encounter Date:  Flowsheet Row INTENSIVE CARDIAC REHAB ORIENT from 01/26/2023 in [facility]  Date 01/26/23       Visit Diagnosis: Heart failure, chronic systolic (HCC)  Patient's Home Medications on Admission:  Current Outpatient Medications:    albuterol (VENTOLIN HFA) 108 (90 Base) MCG/ACT inhaler, Inhale 2 puffs into the lungs every 4 (four) hours as needed., Disp: , Rfl:    amphetamine-dextroamphetamine (ADDERALL XR) 20 MG 24 hr capsule, Take 20 mg by mouth every morning., Disp: , Rfl:    amphetamine-dextroamphetamine (ADDERALL) 10 MG tablet, Take 10 mg by mouth daily. Patient takes on hectic days, when she goes to school and work, Disp: , Rfl:    carvedilol (COREG) 6.25 MG tablet, Take 1 tablet (6.25 mg total) by mouth 2 (two) times daily with a meal., Disp: 60 tablet, Rfl: 11   Cholecalciferol (VITAMIN D-3 PO), Take 1 capsule by mouth daily., Disp: , Rfl:    DHEA 25 MG CAPS, Take 1 capsule by mouth daily., Disp: , Rfl:    digoxin (LANOXIN) 0.125 MG tablet, Take 0.5 tablets (0.0625 mg total) by mouth daily., Disp: 30 tablet, Rfl: 3   empagliflozin (JARDIANCE) 10 MG TABS tablet, Take 1 tablet (10 mg total) by mouth daily., Disp: 30 tablet, Rfl: 11   estrogens, conjugated, (PREMARIN) 1.25 MG tablet, Take 1.25 mg by mouth daily., Disp: , Rfl:    furosemide (LASIX) 20 MG tablet, Take 1 tablet (20 mg total) by mouth as needed for fluid or edema (For weight gain of 3 LB in 1 day or 5 LB in 1 week)., Disp: 10 tablet, Rfl: 0   hydrOXYzine (ATARAX) 25 MG tablet, Take 25-50 mg by mouth at bedtime., Disp: , Rfl:     loratadine (CLARITIN) 10 MG tablet, Take 10 mg by mouth daily., Disp: , Rfl:    progesterone (PROMETRIUM) 100 MG capsule, Take 100 mg by mouth daily., Disp: , Rfl:    sacubitril-valsartan (ENTRESTO) 49-51 MG, Take 1 tablet by mouth 2 (two) times daily., Disp: 60 tablet, Rfl: 11   spironolactone (ALDACTONE) 25 MG tablet, Take 1 tablet (25 mg total) by mouth daily., Disp: 90 tablet, Rfl: 3   traZODone (DESYREL) 50 MG tablet, Take 50-100 mg by mouth at bedtime., Disp: , Rfl:   Past Medical History: Past Medical History:  Diagnosis Date   Anxiety    Medical history non-contributory     Tobacco Use: Social History   Tobacco Use  Smoking Status Former   Current packs/day: 0.00   Types: Cigarettes   Quit date: 04/06/2016   Years since quitting: 6.8  Smokeless Tobacco Never    Labs: Review Flowsheet       Latest Ref Rng & Units 12/07/2022 12/08/2022  Labs for ITP Cardiac and Pulmonary Rehab  Cholestrol 0 - 200 mg/dL - 409   LDL (calc) 0 - 99 mg/dL - 96   HDL-C >81 mg/dL - 69   Trlycerides <191 mg/dL - 478   Hemoglobin G9F 4.8 - 5.6 % 5.1  -  PH, Arterial 7.35 - 7.45 - 7.394   PCO2 arterial 32 - 48 mmHg - 37.7  Bicarbonate 20.0 - 28.0 mmol/L - 23.0  25.6   TCO2 22 - 32 mmol/L - 24  27   Acid-base deficit 0.0 - 2.0 mmol/L - 2.0   O2 Saturation % - 94  60     Details       Multiple values from one day are sorted in reverse-chronological order         Capillary Blood Glucose: No results found for: "GLUCAP"   Exercise Target Goals: Exercise Program Goal: Individual exercise prescription set using results from initial 6 min walk test and THRR while considering  patient's activity barriers and safety.   Exercise Prescription Goal: Initial exercise prescription builds to 30-45 minutes a day of aerobic activity, 2-3 days per week.  Home exercise guidelines will be given to patient during program as part of exercise prescription that the participant will  acknowledge.  Activity Barriers & Risk Stratification:  Activity Barriers & Cardiac Risk Stratification - 01/26/23 1359       Activity Barriers & Cardiac Risk Stratification   Activity Barriers Decreased Ventricular Function;Back Problems    Cardiac Risk Stratification High   <5 METs on            6 Minute Walk:  6 Minute Walk     Row Name 01/26/23 1527         6 Minute Walk   Phase Initial     Distance 1610 feet     Walk Time 6 minutes     # of Rest Breaks 0     MPH 3.05     METS 4.71     RPE 9     Perceived Dyspnea  0     VO2 Peak 16.5     Symptoms No     Resting HR 95 bpm     Resting BP 122/84     Resting Oxygen Saturation  95 %     Exercise Oxygen Saturation  during 6 min walk 95 %     Max Ex. HR 112 bpm     Max Ex. BP 126/72     2 Minute Post BP 122/80              Oxygen Initial Assessment:   Oxygen Re-Evaluation:   Oxygen Discharge (Final Oxygen Re-Evaluation):   Initial Exercise Prescription:  Initial Exercise Prescription - 01/26/23 1500       Date of Initial Exercise RX and Referring Provider   Date 01/26/23    Referring Provider Clearnce Hasten, MD    Expected Discharge Date 04/22/23      Treadmill   MPH 2.8    Grade 0    Minutes 15    METs 3.2      Elliptical   Level 1    Speed 1    Minutes 15    METs 3      Prescription Details   Frequency (times per week) 3    Duration Progress to 30 minutes of continuous aerobic without signs/symptoms of physical distress      Intensity   THRR 40-80% of Max Heartrate 69-139    Ratings of Perceived Exertion 11-13    Perceived Dyspnea 0-4      Progression   Progression Continue progressive overload as per policy without signs/symptoms or physical distress.      Resistance Training   Training Prescription Yes    Weight 3    Reps 10-15  Perform Capillary Blood Glucose checks as needed.  Exercise Prescription Changes:   Exercise Comments:   Exercise  Goals and Review:   Exercise Goals     Row Name 01/26/23 1359             Exercise Goals   Increase Physical Activity Yes       Intervention Provide advice, education, support and counseling about physical activity/exercise needs.;Develop an individualized exercise prescription for aerobic and resistive training based on initial evaluation findings, risk stratification, comorbidities and participant's personal goals.       Expected Outcomes Short Term: Attend rehab on a regular basis to increase amount of physical activity.;Long Term: Exercising regularly at least 3-5 days a week.;Long Term: Add in home exercise to make exercise part of routine and to increase amount of physical activity.       Increase Strength and Stamina Yes       Intervention Develop an individualized exercise prescription for aerobic and resistive training based on initial evaluation findings, risk stratification, comorbidities and participant's personal goals.;Provide advice, education, support and counseling about physical activity/exercise needs.       Expected Outcomes Short Term: Increase workloads from initial exercise prescription for resistance, speed, and METs.;Short Term: Perform resistance training exercises routinely during rehab and add in resistance training at home;Long Term: Improve cardiorespiratory fitness, muscular endurance and strength as measured by increased METs and functional capacity ( )       Able to understand and use rate of perceived exertion (RPE) scale Yes       Intervention Provide education and explanation on how to use RPE scale       Expected Outcomes Short Term: Able to use RPE daily in rehab to express subjective intensity level;Long Term:  Able to use RPE to guide intensity level when exercising independently       Knowledge and understanding of Target Heart Rate Range (THRR) Yes       Intervention Provide education and explanation of THRR including how the numbers were predicted  and where they are located for reference       Expected Outcomes Short Term: Able to state/look up THRR;Short Term: Able to use daily as guideline for intensity in rehab;Long Term: Able to use THRR to govern intensity when exercising independently       Understanding of Exercise Prescription Yes       Intervention Provide education, explanation, and written materials on patient's individual exercise prescription       Expected Outcomes Short Term: Able to explain program exercise prescription;Long Term: Able to explain home exercise prescription to exercise independently                Exercise Goals Re-Evaluation :   Discharge Exercise Prescription (Final Exercise Prescription Changes):   Nutrition:  Target Goals: Understanding of nutrition guidelines, daily intake of sodium 1500mg , cholesterol 200mg , calories 30% from fat and 7% or less from saturated fats, daily to have 5 or more servings of fruits and vegetables.  Biometrics:  Pre Biometrics - 01/26/23 1529       Pre Biometrics   Waist Circumference 34 inches    Hip Circumference 43 inches    Waist to Hip Ratio 0.79 %    Triceps Skinfold 23 mm    % Body Fat 36.4 %    Grip Strength 24 kg    Flexibility 19.5 in    Single Leg Stand 30 seconds  Nutrition Therapy Plan and Nutrition Goals:   Nutrition Assessments:  MEDIFICTS Score Key: >=70 Need to make dietary changes  40-70 Heart Healthy Diet <= 40 Therapeutic Level Cholesterol Diet    Picture Your Plate Scores: <19 Unhealthy dietary pattern with much room for improvement. 41-50 Dietary pattern unlikely to meet recommendations for good health and room for improvement. 51-60 More healthful dietary pattern, with some room for improvement.  >60 Healthy dietary pattern, although there may be some specific behaviors that could be improved.    Nutrition Goals Re-Evaluation:   Nutrition Goals Re-Evaluation:   Nutrition Goals Discharge (Final  Nutrition Goals Re-Evaluation):   Psychosocial: Target Goals: Acknowledge presence or absence of significant depression and/or stress, maximize coping skills, provide positive support system. Participant is able to verbalize types and ability to use techniques and skills needed for reducing stress and depression.  Initial Review & Psychosocial Screening:  Initial Psych Review & Screening - 01/26/23 1530       Initial Review   Current issues with Current Anxiety/Panic;Current Stress Concerns    Source of Stress Concerns Chronic Illness    Comments Kathelene shared that she occassional anxiety and some stress associated with her recent diagnosis while balancing work and school. She states that her current medications are working and she has a Paramedic but isn't actively seeing them. Denies feelings of depression or need for additional resources.      Family Dynamics   Good Support System? Yes   Amanat has her husband for support     Barriers   Psychosocial barriers to participate in program The patient should benefit from training in stress management and relaxation.      Screening Interventions   Interventions To provide support and resources with identified psychosocial needs;Provide feedback about the scores to participant;Encouraged to exercise    Expected Outcomes Long Term goal: The participant improves quality of Life and PHQ9 Scores as seen by post scores and/or verbalization of changes;Short Term goal: Identification and review with participant of any Quality of Life or Depression concerns found by scoring the questionnaire.;Long Term Goal: Stressors or current issues are controlled or eliminated.;Short Term goal: Utilizing psychosocial counselor, staff and physician to assist with identification of specific Stressors or current issues interfering with healing process. Setting desired goal for each stressor or current issue identified.             Quality of Life Scores:  Quality of  Life - 01/26/23 1422       Quality of Life   Select Quality of Life      Quality of Life Scores   Health/Function Pre 15.93 %    Socioeconomic Pre 22.36 %    Psych/Spiritual Pre 17.58 %    Family Pre 10.13 %    GLOBAL Pre 16.92 %            Scores of 19 and below usually indicate a poorer quality of life in these areas.  A difference of  2-3 points is a clinically meaningful difference.  A difference of 2-3 points in the total score of the Quality of Life Index has been associated with significant improvement in overall quality of life, self-image, physical symptoms, and general health in studies assessing change in quality of life.  PHQ-9: Review Flowsheet       01/26/2023  Depression screen PHQ 2/9  Decreased Interest 0  Down, Depressed, Hopeless 0  PHQ - 2 Score 0  Altered sleeping 0  Tired, decreased energy 0  Change in appetite 0  Feeling bad or failure about yourself  0  Trouble concentrating 0  Moving slowly or fidgety/restless 0  Suicidal thoughts 0  PHQ-9 Score 0   Interpretation of Total Score  Total Score Depression Severity:  1-4 = Minimal depression, 5-9 = Mild depression, 10-14 = Moderate depression, 15-19 = Moderately severe depression, 20-27 = Severe depression   Psychosocial Evaluation and Intervention:   Psychosocial Re-Evaluation:   Psychosocial Discharge (Final Psychosocial Re-Evaluation):   Vocational Rehabilitation: Provide vocational rehab assistance to qualifying candidates.   Vocational Rehab Evaluation & Intervention:  Vocational Rehab - 01/26/23 1400       Initial Vocational Rehab Evaluation & Intervention   Assessment shows need for Vocational Rehabilitation No   Lylian is working            Education: Education Goals: Education classes will be provided on a weekly basis, covering required topics. Participant will state understanding/return demonstration of topics presented.     Core Videos: Exercise    Move It!   Clinical staff conducted group or individual video education with verbal and written material and guidebook.  Patient learns the recommended Pritikin exercise program. Exercise with the goal of living a long, healthy life. Some of the health benefits of exercise include controlled diabetes, healthier blood pressure levels, improved cholesterol levels, improved heart and lung capacity, improved sleep, and better body composition. Everyone should speak with their doctor before starting or changing an exercise routine.  Biomechanical Limitations Clinical staff conducted group or individual video education with verbal and written material and guidebook.  Patient learns how biomechanical limitations can impact exercise and how we can mitigate and possibly overcome limitations to have an impactful and balanced exercise routine.  Body Composition Clinical staff conducted group or individual video education with verbal and written material and guidebook.  Patient learns that body composition (ratio of muscle mass to fat mass) is a key component to assessing overall fitness, rather than body weight alone. Increased fat mass, especially visceral belly fat, can put [place] at increased risk for metabolic syndrome, type 2 diabetes, heart disease, and even death. It is recommended to combine diet and exercise (cardiovascular and resistance training) to improve your body composition. Seek guidance from your physician and exercise physiologist before implementing an exercise routine.  Exercise Action Plan Clinical staff conducted group or individual video education with verbal and written material and guidebook.  Patient learns the recommended strategies to achieve and enjoy long-term exercise adherence, including variety, self-motivation, self-efficacy, and positive decision making. Benefits of exercise include fitness, good health, weight management, more energy, better sleep, less stress, and overall  well-being.  Medical   Heart Disease Risk Reduction Clinical staff conducted group or individual video education with verbal and written material and guidebook.  Patient learns our heart is our most vital organ as it circulates oxygen, nutrients, white blood cells, and hormones throughout the entire body, and carries waste away. Data supports a plant-based eating plan like the Pritikin Program for its effectiveness in slowing progression of and reversing heart disease. The video provides a number of recommendations to address heart disease.   Metabolic Syndrome and Belly Fat  Clinical staff conducted group or individual video education with verbal and written material and guidebook.  Patient learns what metabolic syndrome is, how it leads to heart disease, and how one can reverse it and keep it from coming back. You have metabolic syndrome if you have 3 of the following 5 criteria: abdominal obesity,  high blood pressure, high triglycerides, low HDL cholesterol, and high blood sugar.  Hypertension and Heart Disease Clinical staff conducted group or individual video education with verbal and written material and guidebook.  Patient learns that high blood pressure, or hypertension, is very common in the [place]. Hypertension is largely due to excessive salt intake, but other important risk factors include being overweight, physical inactivity, drinking too much alcohol, smoking, and not eating enough potassium from fruits and vegetables. High blood pressure is a leading risk factor for heart attack, stroke, congestive heart failure, dementia, kidney failure, and premature death. Long-term effects of excessive salt intake include stiffening of the arteries and thickening of heart muscle and organ damage. Recommendations include ways to reduce hypertension and the risk of heart disease.  Diseases of Our Time - Focusing on Diabetes Clinical staff conducted group or individual video education with  verbal and written material and guidebook.  Patient learns why the best way to stop diseases of our time is prevention, through food and other lifestyle changes. Medicine (such as prescription pills and surgeries) is often only a Band-Aid on the problem, not a long-term solution. Most common diseases of our time include obesity, type 2 diabetes, hypertension, heart disease, and cancer. The Pritikin Program is recommended and has been proven to help reduce, reverse, and/or prevent the damaging effects of metabolic syndrome.  Nutrition   Overview of the Pritikin Eating Plan  Clinical staff conducted group or individual video education with verbal and written material and guidebook.  Patient learns about the Pritikin Eating Plan for disease risk reduction. The Pritikin Eating Plan emphasizes a wide variety of unrefined, minimally-processed carbohydrates, like fruits, vegetables, whole grains, and legumes. Go, Caution, and Stop food choices are explained. Plant-based and lean animal proteins are emphasized. Rationale provided for low sodium intake for blood pressure control, low added sugars for blood sugar stabilization, and low added fats and oils for coronary artery disease risk reduction and weight management.  Calorie Density  Clinical staff conducted group or individual video education with verbal and written material and guidebook.  Patient learns about calorie density and how it impacts the Pritikin Eating Plan. Knowing the characteristics of the food you choose will help you decide whether those foods will lead to weight gain or weight loss, and whether you want to consume more or less of them. Weight loss is usually a side effect of the Pritikin Eating Plan because of its focus on low calorie-dense foods.  Label Reading  Clinical staff conducted group or individual video education with verbal and written material and guidebook.  Patient learns about the Pritikin recommended label reading  guidelines and corresponding recommendations regarding calorie density, added sugars, sodium content, and whole grains.  Dining Out - Part 1  Clinical staff conducted group or individual video education with verbal and written material and guidebook.  Patient learns that restaurant meals can be sabotaging because they can be so high in calories, fat, sodium, and/or sugar. Patient learns recommended strategies on how to positively address this and avoid unhealthy pitfalls.  Facts on Fats  Clinical staff conducted group or individual video education with verbal and written material and guidebook.  Patient learns that lifestyle modifications can be just as effective, if not more so, as many medications for lowering your risk of heart disease. A Pritikin lifestyle can help to reduce your risk of inflammation and atherosclerosis (cholesterol build-up, or plaque, in the artery walls). Lifestyle interventions such as dietary choices and physical activity  address the cause of atherosclerosis. A review of the types of fats and their impact on blood cholesterol levels, along with dietary recommendations to reduce fat intake is also included.  Nutrition Action Plan  Clinical staff conducted group or individual video education with verbal and written material and guidebook.  Patient learns how to incorporate Pritikin recommendations into their lifestyle. Recommendations include planning and keeping personal health goals in mind as an important part of their success.  Healthy Mind-Set    Healthy Minds, Bodies, Hearts  Clinical staff conducted group or individual video education with verbal and written material and guidebook.  Patient learns how to identify when they are stressed. Video will discuss the impact of that stress, as well as the many benefits of stress management. Patient will also be introduced to stress management techniques. The way we think, act, and feel has an impact on our hearts.  How Our  Thoughts Can Heal Our Hearts  Clinical staff conducted group or individual video education with verbal and written material and guidebook.  Patient learns that negative thoughts can cause depression and anxiety. This can result in negative lifestyle behavior and serious health problems. Cognitive behavioral therapy is an effective method to help control our thoughts in order to change and improve our emotional outlook.  Additional Videos:  Exercise    Improving Performance  Clinical staff conducted group or individual video education with verbal and written material and guidebook.  Patient learns to use a non-linear approach by alternating intensity levels and lengths of time spent exercising to help burn more calories and lose more body fat. Cardiovascular exercise helps improve heart health, metabolism, hormonal balance, blood sugar control, and recovery from fatigue. Resistance training improves strength, endurance, balance, coordination, reaction time, metabolism, and muscle mass. Flexibility exercise improves circulation, posture, and balance. Seek guidance from your physician and exercise physiologist before implementing an exercise routine and learn your capabilities and proper form for all exercise.  Introduction to Yoga  Clinical staff conducted group or individual video education with verbal and written material and guidebook.  Patient learns about yoga, a discipline of the coming together of mind, breath, and body. The benefits of yoga include improved flexibility, improved range of motion, better posture and core strength, increased lung function, weight loss, and positive self-image. Yoga's heart health benefits include lowered blood pressure, healthier heart rate, decreased cholesterol and triglyceride levels, improved immune function, and reduced stress. Seek guidance from your physician and exercise physiologist before implementing an exercise routine and learn your capabilities and  proper form for all exercise.  Medical   Aging: Enhancing Your Quality of Life  Clinical staff conducted group or individual video education with verbal and written material and guidebook.  Patient learns key strategies and recommendations to stay in good physical health and enhance quality of life, such as prevention strategies, having an advocate, securing a Health Care Proxy and Power of Attorney, and keeping a list of medications and system for tracking them. It also discusses how to avoid risk for bone loss.  Biology of Weight Control  Clinical staff conducted group or individual video education with verbal and written material and guidebook.  Patient learns that weight gain occurs because we consume more calories than we burn (eating more, moving less). Even if your body weight is normal, you may have higher ratios of fat compared to muscle mass. Too much body fat puts you at increased risk for cardiovascular disease, heart attack, stroke, type 2 diabetes, and obesity-related cancers.  In addition to exercise, following the Pritikin Eating Plan can help reduce your risk.  Decoding Lab Results  Clinical staff conducted group or individual video education with verbal and written material and guidebook.  Patient learns that lab test reflects one measurement whose values change over time and are influenced by many factors, including medication, stress, sleep, exercise, food, hydration, pre-existing medical conditions, and more. It is recommended to use the knowledge from this video to become more involved with your lab results and evaluate your numbers to speak with your doctor.   Diseases of Our Time - Overview  Clinical staff conducted group or individual video education with verbal and written material and guidebook.  Patient learns that according to the CDC, 50% to 70% of chronic diseases (such as obesity, type 2 diabetes, elevated lipids, hypertension, and heart disease) are avoidable through  lifestyle improvements including healthier food choices, listening to satiety cues, and increased physical activity.  Sleep Disorders Clinical staff conducted group or individual video education with verbal and written material and guidebook.  Patient learns how good quality and duration of sleep are important to overall health and well-being. Patient also learns about sleep disorders and how they impact health along with recommendations to address them, including discussing with a physician.  Nutrition  Dining Out - Part 2 Clinical staff conducted group or individual video education with verbal and written material and guidebook.  Patient learns how to plan ahead and communicate in order to maximize their dining experience in a healthy and nutritious manner. Included are recommended food choices based on the type of restaurant the patient is visiting.   Fueling a Banker conducted group or individual video education with verbal and written material and guidebook.  There is a strong connection between our food choices and our health. Diseases like obesity and type 2 diabetes are very prevalent and are in large-part due to lifestyle choices. The Pritikin Eating Plan provides plenty of food and hunger-curbing satisfaction. It is easy to follow, affordable, and helps reduce health risks.  Menu Workshop  Clinical staff conducted group or individual video education with verbal and written material and guidebook.  Patient learns that restaurant meals can sabotage health goals because they are often packed with calories, fat, sodium, and sugar. Recommendations include strategies to plan ahead and to communicate with the manager, chef, or server to help order a healthier meal.  Planning Your Eating Strategy  Clinical staff conducted group or individual video education with verbal and written material and guidebook.  Patient learns about the Pritikin Eating Plan and its benefit of  reducing the risk of disease. The Pritikin Eating Plan does not focus on calories. Instead, it emphasizes high-quality, nutrient-rich foods. By knowing the characteristics of the foods, we choose, we can determine their calorie density and make informed decisions.  Targeting Your Nutrition Priorities  Clinical staff conducted group or individual video education with verbal and written material and guidebook.  Patient learns that lifestyle habits have a tremendous impact on disease risk and progression. This video provides eating and physical activity recommendations based on your personal health goals, such as reducing LDL cholesterol, losing weight, preventing or controlling type 2 diabetes, and reducing high blood pressure.  Vitamins and Minerals  Clinical staff conducted group or individual video education with verbal and written material and guidebook.  Patient learns different ways to obtain key vitamins and minerals, including through a recommended healthy diet. It is important to discuss all supplements you  take with your doctor.   Healthy Mind-Set    Smoking Cessation  Clinical staff conducted group or individual video education with verbal and written material and guidebook.  Patient learns that cigarette smoking and tobacco addiction pose a serious health risk which affects millions of people. Stopping smoking will significantly reduce the risk of heart disease, lung disease, and many forms of cancer. Recommended strategies for quitting are covered, including working with your doctor to develop a successful plan.  Culinary   Becoming a Set designer conducted group or individual video education with verbal and written material and guidebook.  Patient learns that cooking at home can be healthy, cost-effective, quick, and puts them in control. Keys to cooking healthy recipes will include looking at your recipe, assessing your equipment needs, planning ahead, making it  simple, choosing cost-effective seasonal ingredients, and limiting the use of added fats, salts, and sugars.  Cooking - Breakfast and Snacks  Clinical staff conducted group or individual video education with verbal and written material and guidebook.  Patient learns how important breakfast is to satiety and nutrition through the entire day. Recommendations include key foods to eat during breakfast to help stabilize blood sugar levels and to prevent overeating at meals later in the day. Planning ahead is also a key component.  Cooking - Educational psychologist conducted group or individual video education with verbal and written material and guidebook.  Patient learns eating strategies to improve overall health, including an approach to cook more at home. Recommendations include thinking of animal protein as a side on your plate rather than center stage and focusing instead on lower calorie dense options like vegetables, fruits, whole grains, and plant-based proteins, such as beans. Making sauces in large quantities to freeze for later and leaving the skin on your vegetables are also recommended to maximize your experience.  Cooking - Healthy Salads and Dressing Clinical staff conducted group or individual video education with verbal and written material and guidebook.  Patient learns that vegetables, fruits, whole grains, and legumes are the foundations of the Pritikin Eating Plan. Recommendations include how to incorporate each of these in flavorful and healthy salads, and how to create homemade salad dressings. Proper handling of ingredients is also covered. Cooking - Soups and [employer] - Soups and Desserts Clinical staff conducted group or individual video education with verbal and written material and guidebook.  Patient learns that Pritikin soups and desserts make for easy, nutritious, and delicious snacks and meal components that are low in sodium, fat, sugar, and calorie  density, while high in vitamins, minerals, and filling fiber. Recommendations include simple and healthy ideas for soups and desserts.   Overview     The Pritikin Solution Program Overview Clinical staff conducted group or individual video education with verbal and written material and guidebook.  Patient learns that the results of the Pritikin Program have been documented in more than 100 articles published in peer-reviewed journals, and the benefits include reducing risk factors for (and, in some cases, even reversing) high cholesterol, high blood pressure, type 2 diabetes, obesity, and more! An overview of the three key pillars of the Pritikin Program will be covered: eating well, doing regular exercise, and having a healthy mind-set.  WORKSHOPS  Exercise: Exercise Basics: Building Your Action Plan Clinical staff led group instruction and group discussion with PowerPoint presentation and patient guidebook. To enhance the learning environment the use of posters, models and videos may be added. At  the conclusion of this workshop, patients will comprehend the difference between physical activity and exercise, as well as the benefits of incorporating both, into their routine. Patients will understand the FITT (Frequency, Intensity, Time, and Type) principle and how to use it to build an exercise action plan. In addition, safety concerns and other considerations for exercise and cardiac rehab will be addressed by the presenter. The purpose of this lesson is to promote a comprehensive and effective weekly exercise routine in order to improve patients' overall level of fitness.   Managing Heart Disease: Your Path to a Healthier Heart Clinical staff led group instruction and group discussion with PowerPoint presentation and patient guidebook. To enhance the learning environment the use of posters, models and videos may be added.At the conclusion of this workshop, patients will understand the  anatomy and physiology of the heart. Additionally, they will understand how Pritikin's three pillars impact the risk factors, the progression, and the management of heart disease.  The purpose of this lesson is to provide a high-level overview of the heart, heart disease, and how the Pritikin lifestyle positively impacts risk factors.  Exercise Biomechanics Clinical staff led group instruction and group discussion with PowerPoint presentation and patient guidebook. To enhance the learning environment the use of posters, models and videos may be added. Patients will learn how the structural parts of their bodies function and how these functions impact their daily activities, movement, and exercise. Patients will learn how to promote a neutral spine, learn how to manage pain, and identify ways to improve their physical movement in order to promote healthy living. The purpose of this lesson is to expose patients to common physical limitations that impact physical activity. Participants will learn practical ways to adapt and manage aches and pains, and to minimize their effect on regular exercise. Patients will learn how to maintain good posture while sitting, walking, and lifting.  Balance Training and Fall Prevention  Clinical staff led group instruction and group discussion with PowerPoint presentation and patient guidebook. To enhance the learning environment the use of posters, models and videos may be added. At the conclusion of this workshop, patients will understand the importance of their sensorimotor skills (vision, proprioception, and the vestibular system) in maintaining their ability to balance as they age. Patients will apply a variety of balancing exercises that are appropriate for their current level of function. Patients will understand the common causes for poor balance, possible solutions to these problems, and ways to modify their physical environment in order to minimize their  fall risk. The purpose of this lesson is to teach patients about the importance of maintaining balance as they age and ways to minimize their risk of falling.  WORKSHOPS   Nutrition:  Fueling a Ship broker led group instruction and group discussion with PowerPoint presentation and patient guidebook. To enhance the learning environment the use of posters, models and videos may be added. Patients will review the foundational principles of the Pritikin Eating Plan and understand what constitutes a serving size in each of the food groups. Patients will also learn Pritikin-friendly foods that are better choices when away from home and review make-ahead meal and snack options. Calorie density will be reviewed and applied to three nutrition priorities: weight maintenance, weight loss, and weight gain. The purpose of this lesson is to reinforce (in a group setting) the key concepts around what patients are recommended to eat and how to apply these guidelines when away from home by planning and selecting  Pritikin-friendly options. Patients will understand how calorie density may be adjusted for different weight management goals.  Mindful Eating  Clinical staff led group instruction and group discussion with PowerPoint presentation and patient guidebook. To enhance the learning environment the use of posters, models and videos may be added. Patients will briefly review the concepts of the Pritikin Eating Plan and the importance of low-calorie dense foods. The concept of mindful eating will be introduced as well as the importance of paying attention to internal hunger signals. Triggers for non-hunger eating and techniques for dealing with triggers will be explored. The purpose of this lesson is to provide patients with the opportunity to review the basic principles of the Pritikin Eating Plan, discuss the value of eating mindfully and how to measure internal cues of hunger and fullness using the  Hunger Scale. Patients will also discuss reasons for non-hunger eating and learn strategies to use for controlling emotional eating.  Targeting Your Nutrition Priorities Clinical staff led group instruction and group discussion with PowerPoint presentation and patient guidebook. To enhance the learning environment the use of posters, models and videos may be added. Patients will learn how to determine their genetic susceptibility to disease by reviewing their family history. Patients will gain insight into the importance of diet as part of an overall healthy lifestyle in mitigating the impact of genetics and other environmental insults. The purpose of this lesson is to provide patients with the opportunity to assess their personal nutrition priorities by looking at their family history, their own health history and current risk factors. Patients will also be able to discuss ways of prioritizing and modifying the Pritikin Eating Plan for their highest risk areas  Menu  Clinical staff led group instruction and group discussion with PowerPoint presentation and patient guidebook. To enhance the learning environment the use of posters, models and videos may be added. Using menus brought in from [employer], or printed from [employer], patients will apply the Pritikin dining out guidelines that were presented in the [employer] video. Patients will also be able to practice these guidelines in a variety of provided scenarios. The purpose of this lesson is to provide patients with the opportunity to practice hands-on learning of the Pritikin Dining Out guidelines with actual menus and practice scenarios.  Label Reading Clinical staff led group instruction and group discussion with PowerPoint presentation and patient guidebook. To enhance the learning environment the use of posters, models and videos may be added. Patients will review and discuss the Pritikin label reading guidelines  presented in Pritikin's Label Reading Educational series video. Using fool labels brought in from local grocery stores and markets, patients will apply the label reading guidelines and determine if the packaged food meet the Pritikin guidelines. The purpose of this lesson is to provide patients with the opportunity to review, discuss, and practice hands-on learning of the Pritikin Label Reading guidelines with actual packaged food labels. Cooking School  Pritikin's [employer] are designed to teach patients ways to prepare quick, simple, and affordable recipes at home. The importance of nutrition's role in chronic disease risk reduction is reflected in its emphasis in the overall Pritikin program. By learning how to prepare essential core Pritikin Eating Plan recipes, patients will increase control over what they eat; be able to customize the flavor of foods without the use of added salt, sugar, or fat; and improve the quality of the food they consume. By learning a set of core recipes which are easily  assembled, quickly prepared, and affordable, patients are more likely to prepare more healthy foods at home. These workshops focus on convenient breakfasts, simple entres, side dishes, and desserts which can be prepared with minimal effort and are consistent with nutrition recommendations for cardiovascular risk reduction. Cooking [employer] are taught by a Armed forces logistics/support/administrative officer (RD) who has been trained by the [employer]. The chef or RD has a clear understanding of the importance of minimizing - if not completely eliminating - added fat, sugar, and sodium in recipes. Throughout the series of Cooking School Workshop sessions, patients will learn about healthy ingredients and efficient methods of cooking to build confidence in their capability to prepare    Cooking School weekly topics:  Adding Flavor- Sodium-Free  Fast and Healthy Breakfasts  Powerhouse Plant-Based  Proteins  Satisfying Salads and Dressings  Simple Sides and Sauces  International Cuisine-Spotlight on the [employer] Zones  Delicious Desserts  Savory Soups  [employer] - Meals in a Astronomer Appetizers and Snacks  Comforting Weekend Breakfasts  One-Pot Wonders   Fast Evening Meals  Landscape architect Your Pritikin Plate  WORKSHOPS   Healthy Mindset (Psychosocial):  Focused Goals, Sustainable Changes Clinical staff led group instruction and group discussion with PowerPoint presentation and patient guidebook. To enhance the learning environment the use of posters, models and videos may be added. Patients will be able to apply effective goal setting strategies to establish at least one personal goal, and then take consistent, meaningful action toward that goal. They will learn to identify common barriers to achieving personal goals and develop strategies to overcome them. Patients will also gain an understanding of how our mind-set can impact our ability to achieve goals and the importance of cultivating a positive and growth-oriented mind-set. The purpose of this lesson is to provide patients with a deeper understanding of how to set and achieve personal goals, as well as the tools and strategies needed to overcome common obstacles which may arise along the way.  From Head to Heart: The Power of a Healthy Outlook  Clinical staff led group instruction and group discussion with PowerPoint presentation and patient guidebook. To enhance the learning environment the use of posters, models and videos may be added. Patients will be able to recognize and describe the impact of emotions and mood on physical health. They will discover the importance of self-care and explore self-care practices which may work for them. Patients will also learn how to utilize the 4 C's to cultivate a healthier outlook and better manage stress and challenges. The purpose of this lesson is to demonstrate  to patients how a healthy outlook is an essential part of maintaining good health, especially as they continue their cardiac rehab journey.  Healthy Sleep for a Healthy Heart Clinical staff led group instruction and group discussion with PowerPoint presentation and patient guidebook. To enhance the learning environment the use of posters, models and videos may be added. At the conclusion of this workshop, patients will be able to demonstrate knowledge of the importance of sleep to overall health, well-being, and quality of life. They will understand the symptoms of, and treatments for, common sleep disorders. Patients will also be able to identify daytime and nighttime behaviors which impact sleep, and they will be able to apply these tools to help manage sleep-related challenges. The purpose of this lesson is to provide patients with a general overview of sleep and outline the importance of quality sleep. Patients will learn  about a few of the most common sleep disorders. Patients will also be introduced to the concept of "sleep hygiene," and discover ways to self-manage certain sleeping problems through simple daily behavior changes. Finally, the workshop will motivate patients by clarifying the links between quality sleep and their goals of heart-healthy living.   Recognizing and Reducing Stress Clinical staff led group instruction and group discussion with PowerPoint presentation and patient guidebook. To enhance the learning environment the use of posters, models and videos may be added. At the conclusion of this workshop, patients will be able to understand the types of stress reactions, differentiate between acute and chronic stress, and recognize the impact that chronic stress has on their health. They will also be able to apply different coping mechanisms, such as reframing negative self-talk. Patients will have the opportunity to practice a variety of stress management techniques, such as deep  abdominal breathing, progressive muscle relaxation, and/or guided imagery.  The purpose of this lesson is to educate patients on the role of stress in their lives and to provide healthy techniques for coping with it.  Learning Barriers/Preferences:  Learning Barriers/Preferences - 01/26/23 1400       Learning Barriers/Preferences   Learning Barriers Sight   glasses   Learning Preferences Audio;Computer/Internet;Group Instruction;Individual Instruction;Pictoral;Skilled Demonstration;Verbal Instruction;Video;Written Material             Education Topics:  Knowledge Questionnaire Score:  Knowledge Questionnaire Score - 01/26/23 1417       Knowledge Questionnaire Score   Pre Score 23/24             Core Components/Risk Factors/Patient Goals at Admission:  Personal Goals and Risk Factors at Admission - 01/26/23 1400       Core Components/Risk Factors/Patient Goals on Admission    Weight Management Yes;Weight Loss   pt goal   Intervention Weight Management: Develop a combined nutrition and exercise program designed to reach desired caloric intake, while maintaining appropriate intake of nutrient and fiber, sodium and fats, and appropriate energy expenditure required for the weight goal.;Weight Management: Provide education and appropriate resources to help participant work on and attain dietary goals.    Expected Outcomes Short Term: Continue to assess and modify interventions until short term weight is achieved;Long Term: Adherence to nutrition and physical activity/exercise program aimed toward attainment of established weight goal;Weight Loss: Understanding of general recommendations for a balanced deficit meal plan, which promotes 1-2 lb weight loss per week and includes a negative energy balance of [phone] kcal/d;Understanding recommendations for meals to include 15-35% energy as protein, 25-35% energy from fat, 35-60% energy from carbohydrates, less than 200mg  of dietary  cholesterol, 20-35 gm of total fiber daily;Understanding of distribution of calorie intake throughout the day with the consumption of 4-5 meals/snacks    Heart Failure Yes    Intervention Provide a combined exercise and nutrition program that is supplemented with education, support and counseling about heart failure. Directed toward relieving symptoms such as shortness of breath, decreased exercise tolerance, and extremity edema.    Expected Outcomes Improve functional capacity of life;Short term: Attendance in program 2-3 days a week with increased exercise capacity. Reported lower sodium intake. Reported increased fruit and vegetable intake. Reports medication compliance.;Short term: Daily weights obtained and reported for increase. Utilizing diuretic protocols set by physician.;Long term: Adoption of self-care skills and reduction of barriers for early signs and symptoms recognition and intervention leading to self-care maintenance.    Hypertension Yes    Intervention Provide education on lifestyle modifcations  including regular physical activity/exercise, weight management, moderate sodium restriction and increased consumption of fresh fruit, vegetables, and low fat dairy, alcohol moderation, and smoking cessation.;Monitor prescription use compliance.    Expected Outcomes Short Term: Continued assessment and intervention until BP is < 140/27mm HG in hypertensive participants. < 130/80mm HG in hypertensive participants with diabetes, heart failure or chronic kidney disease.;Long Term: Maintenance of blood pressure at goal levels.    Stress Yes    Intervention Offer individual and/or small group education and counseling on adjustment to heart disease, stress management and health-related lifestyle change. Teach and support self-help strategies.;Refer participants experiencing significant psychosocial distress to appropriate mental health specialists for further evaluation and treatment. When possible,  include family members and significant others in education/counseling sessions.    Expected Outcomes Short Term: Participant demonstrates changes in health-related behavior, relaxation and other stress management skills, ability to obtain effective social support, and compliance with psychotropic medications if prescribed.;Long Term: Emotional wellbeing is indicated by absence of clinically significant psychosocial distress or social isolation.             Core Components/Risk Factors/Patient Goals Review:    Core Components/Risk Factors/Patient Goals at Discharge (Final Review):    ITP Comments:  ITP Comments     Row Name 01/26/23 1346           ITP Comments Dr. Armanda Magic medical director. Introduction to [facility] education/ intensive cardiac rehab. Initial orientation packet reviewed with patient.                Comments: Participant attended orientation for the cardiac rehabilitation program on  01/26/2023  to perform initial intake and exercise walk test. Patient introduced to the [facility] education and orientation packet was reviewed. Completed 6-minute walk test, measurements, initial ITP, and exercise prescription. Vital signs stable. Telemetry-normal sinus rhythm occ PVC, asymptomatic.   Service time was from 1335 to 1507.

## 2023-01-26 NOTE — Progress Notes (Signed)
Cardiac Rehab Medication Review   Does the patient  feel that his/her medications are working for him/her? YES  Has the patient been experiencing any side effects to the medications prescribed?  NO  Does the patient measure his/her own blood pressure or blood glucose at home?   NO   Does the patient have any problems obtaining medications due to transportation or finances?   NO  Understanding of regimen: excellent Understanding of indications: excellent Potential of compliance: excellent    Comments: jordyne gensch her medications and regime well. She does not have a BP cuff, encouraged to get one, but checks her weight daily.     Jonna Coup, MS, ACSM-CEP 01/26/2023 2:03 PM

## 2023-02-02 ENCOUNTER — Encounter (HOSPITAL_COMMUNITY): Payer: 59

## 2023-02-06 ENCOUNTER — Encounter (HOSPITAL_COMMUNITY): Payer: 59

## 2023-02-06 ENCOUNTER — Other Ambulatory Visit (HOSPITAL_COMMUNITY): Payer: Self-pay | Admitting: *Deleted

## 2023-02-06 MED ORDER — FUROSEMIDE 20 MG PO TABS: 20.0000 mg | ORAL_TABLET | Freq: Every day | ORAL | 3 refills | Status: DC | PRN

## 2023-02-09 ENCOUNTER — Encounter (HOSPITAL_COMMUNITY)
Admission: RE | Admit: 2023-02-09 | Discharge: 2023-02-09 | Disposition: A | Discharge status: Home / Self Care | Payer: 59 | Source: Ambulatory Visit | Attending: Cardiology | Admitting: Cardiology

## 2023-02-09 DIAGNOSIS — I5022 Chronic systolic (congestive) heart failure: Secondary | ICD-10-CM | POA: Diagnosis not present

## 2023-02-09 NOTE — Progress Notes (Signed)
Daily Session Note  Patient Details  Name: Blakesley Gehman MRN: [id number] Date of Birth: 1975-02-29 Referring Provider:   Flowsheet Row INTENSIVE CARDIAC REHAB ORIENT from 01/26/2023 in [facility]  Referring Provider Clearnce Hasten, MD       Encounter Date: 02/09/2023  Check In:  Session Check In - 02/09/23 1428       Check-In   Supervising physician immediately available to respond to emergencies [facility] - Physician supervision    Physician(s) Jari Favre, PA    Location [facility]    Staff Present Valinda Party, MS, Exercise Physiologist;Johnny Hale Bogus, MS, Exercise Physiologist;Jmichael Gille, RN, Marton Redwood, MS, ACSM-CEP, CCRP, Exercise Physiologist    Virtual Visit No    Medication changes reported     No    Fall or balance concerns reported    No    Tobacco Cessation No Change    Warm-up and Cool-down Performed as group-led instruction    Resistance Training Performed Yes    VAD Patient? No    PAD/SET Patient? No      Pain Assessment   Currently in Pain? No/denies    Multiple Pain Sites No             Capillary Blood Glucose: No results found for this or any previous visit (from the past 24 hours).   Exercise Prescription Changes - 02/09/23 1600       Response to Exercise   Blood Pressure (Admit) 118/70    Blood Pressure (Exercise) 134/78    Blood Pressure (Exit) 122/78    Heart Rate (Admit) 99 bpm    Heart Rate (Exercise) 144 bpm    Heart Rate (Exit) 104 bpm    Rating of Perceived Exertion (Exercise) 9    Perceived Dyspnea (Exercise) 0    Symptoms None    Comments Pt first day in CRP2 program    Duration Continue with 30 min of aerobic exercise without signs/symptoms of physical distress.    Intensity THRR unchanged      Progression   Progression Continue to progress workloads to maintain intensity without signs/symptoms of physical distress.    Average METs 4.37       Resistance Training   Training Prescription Yes    Weight 3    Reps 10-15      Treadmill   MPH 2.8    Grade 0    Minutes 15    METs 3.14      Elliptical   Level 1    Speed 1    Minutes 15    METs 5.6             Social History   Tobacco Use  Smoking Status Former   Current packs/day: 0.00   Types: Cigarettes   Quit date: 04/06/2016   Years since quitting: 6.8  Smokeless Tobacco Never    Goals Met:  Exercise tolerated well No report of concerns or symptoms today Strength training completed today  Goals Unmet:  Not Applicable  Comments: Pt started cardiac rehab today.  Pt tolerated light exercise without difficulty. VSS, telemetry-Sinus Rhythm, asymptomatic.  Medication list reconciled. Pt denies barriers to medicaiton compliance.  PSYCHOSOCIAL ASSESSMENT:  PHQ-0. Pt exhibits positive coping skills, hopeful outlook with supportive family. Will review quality of life questionnaire in the upcoming week.    Pt enjoys hiking, sports and travelling..   Pt oriented to exercise equipment and routine.    Understanding verbalized.Byrd Hesselbach  Mila Palmer RN BSN    Dr. Armanda Magic is Medical Director for Cardiac Rehab at [facility].

## 2023-02-13 ENCOUNTER — Encounter (HOSPITAL_COMMUNITY): Payer: 59

## 2023-02-15 ENCOUNTER — Other Ambulatory Visit (HOSPITAL_COMMUNITY): Payer: Self-pay | Admitting: Cardiology

## 2023-02-16 ENCOUNTER — Encounter (HOSPITAL_COMMUNITY)
Admission: RE | Admit: 2023-02-16 | Discharge: 2023-02-16 | Disposition: A | Discharge status: Home / Self Care | Payer: 59 | Source: Ambulatory Visit | Attending: Cardiology | Admitting: Cardiology

## 2023-02-16 DIAGNOSIS — Z5189 Encounter for other specified aftercare: Secondary | ICD-10-CM | POA: Diagnosis not present

## 2023-02-16 DIAGNOSIS — I5022 Chronic systolic (congestive) heart failure: Secondary | ICD-10-CM | POA: Insufficient documentation

## 2023-02-16 NOTE — Progress Notes (Signed)
 QUALITY OF LIFE SCORE REVIEW  Pt completed Quality of Life survey as a participant in Cardiac Rehab.  Scores 21.0 or below are considered low.  Pt score very low in several areas Overall 16.92, Health and Function 15.93, socioeconomic 22.36, physiological and spiritual 17.58, family 10.13. Patient quality of life slightly altered by physical constraints which limits ability to perform as prior to recent cardiac illness. Joel says she is dissatisfied with her health due to her CHF diagnosis. Dayja says that her shortness of breath has improved some. Mekiyah denies being depressed currently. Colby does have a therapist that she meets with twice a month. Haniyah does not want her score review forwarded to her PCP at this time.  Offered emotional support and reassurance.  Will continue to monitor and intervene as necessary.Hadassah Elpidio Quan RN BSN

## 2023-02-17 ENCOUNTER — Ambulatory Visit (HOSPITAL_COMMUNITY)
Admission: RE | Admit: 2023-02-17 | Discharge: 2023-02-17 | Disposition: A | Discharge status: Home / Self Care | Payer: 59 | Source: Ambulatory Visit | Attending: Cardiology | Admitting: Cardiology

## 2023-02-17 ENCOUNTER — Encounter (HOSPITAL_COMMUNITY): Payer: Self-pay | Admitting: Cardiology

## 2023-02-17 VITALS — BP 101/89 | HR 97 | Ht 64.0 in | Wt 163.8 lb

## 2023-02-17 DIAGNOSIS — I428 Other cardiomyopathies: Secondary | ICD-10-CM | POA: Diagnosis not present

## 2023-02-17 DIAGNOSIS — I5022 Chronic systolic (congestive) heart failure: Secondary | ICD-10-CM | POA: Diagnosis not present

## 2023-02-17 DIAGNOSIS — Z87891 Personal history of nicotine dependence: Secondary | ICD-10-CM | POA: Insufficient documentation

## 2023-02-17 DIAGNOSIS — Z7984 Long term (current) use of oral hypoglycemic drugs: Secondary | ICD-10-CM | POA: Diagnosis not present

## 2023-02-17 DIAGNOSIS — I110 Hypertensive heart disease with heart failure: Secondary | ICD-10-CM | POA: Diagnosis not present

## 2023-02-17 DIAGNOSIS — Z79899 Other long term (current) drug therapy: Secondary | ICD-10-CM | POA: Diagnosis not present

## 2023-02-17 DIAGNOSIS — I10 Essential (primary) hypertension: Secondary | ICD-10-CM | POA: Diagnosis not present

## 2023-02-17 NOTE — Progress Notes (Signed)
 Cardiac Individual Treatment Plan  Patient Details  Name: Hannah Bullock MRN: [id number] Date of Birth: 04-13-1975 Referring Provider:   Flowsheet Row INTENSIVE CARDIAC REHAB ORIENT from 01/26/2023 in [facility]  Referring Provider Morene Brownie, MD       Initial Encounter Date:  Flowsheet Row INTENSIVE CARDIAC REHAB ORIENT from 01/26/2023 in [facility]  Date 01/26/23       Visit Diagnosis: Heart failure, chronic systolic (HCC)  Patient's Home Medications on Admission:  Current Outpatient Medications:    albuterol (VENTOLIN HFA) 108 (90 Base) MCG/ACT inhaler, Inhale 2 puffs into the lungs every 4 (four) hours as needed., Disp: , Rfl:    amphetamine-dextroamphetamine (ADDERALL XR) 20 MG 24 hr capsule, Take 20 mg by mouth every morning., Disp: , Rfl:    amphetamine-dextroamphetamine (ADDERALL) 10 MG tablet, Take 10 mg by mouth daily. Patient takes on hectic days, when she goes to school and work, Disp: , Rfl:    carvedilol  (COREG ) 6.25 MG tablet, Take 1 tablet (6.25 mg total) by mouth 2 (two) times daily with a meal., Disp: 60 tablet, Rfl: 11   Cholecalciferol (VITAMIN D-3 PO), Take 1 capsule by mouth daily., Disp: , Rfl:    DHEA 25 MG CAPS, Take 1 capsule by mouth daily., Disp: , Rfl:    digoxin  (LANOXIN ) 0.125 MG tablet, Take 0.5 tablets (0.0625 mg total) by mouth daily., Disp: 30 tablet, Rfl: 3   empagliflozin  (JARDIANCE ) 10 MG TABS tablet, Take 1 tablet (10 mg total) by mouth daily., Disp: 30 tablet, Rfl: 11   estrogens, conjugated, (PREMARIN) 1.25 MG tablet, Take 1.25 mg by mouth daily., Disp: , Rfl:    furosemide  (LASIX ) 20 MG tablet, TAKE 1 TABLET BY MOUTH DAILY AS NEEDED FOR FLUID OR EDEMA (WEIGHT GAIN OF 3LB IN 1 DAY OR 5LB IN 1 WEEK)., Disp: 90 tablet, Rfl: 1   hydrOXYzine (ATARAX) 25 MG tablet, Take 25-50 mg by mouth at bedtime., Disp: , Rfl:    loratadine  (CLARITIN) 10 MG tablet, Take 10 mg by mouth daily., Disp: , Rfl:    progesterone (PROMETRIUM) 100 MG capsule, Take 100 mg by mouth daily., Disp: , Rfl:    sacubitril -valsartan  (ENTRESTO ) 49-51 MG, Take 1 tablet by mouth 2 (two) times daily., Disp: 60 tablet, Rfl: 11   spironolactone  (ALDACTONE ) 25 MG tablet, Take 1 tablet (25 mg total) by mouth daily., Disp: 90 tablet, Rfl: 3   traZODone (DESYREL) 50 MG tablet, Take 50-100 mg by mouth at bedtime., Disp: , Rfl:   Past Medical History: Past Medical History:  Diagnosis Date   Anxiety    Medical history non-contributory     Tobacco Use: Social History   Tobacco Use  Smoking Status Former   Current packs/day: 0.00   Types: Cigarettes   Quit date: 04/06/2016   Years since quitting: 6.8  Smokeless Tobacco Never    Labs: Review Flowsheet       Latest Ref Rng & Units 12/07/2022 12/08/2022  Labs for ITP Cardiac and Pulmonary Rehab  Cholestrol 0 - 200 mg/dL - 812   LDL (calc) 0 - 99 mg/dL - 96   HDL-C >59 mg/dL - 69   Trlycerides <849 mg/dL - 891   Hemoglobin J8r 4.8 - 5.6 % 5.1  -  PH, Arterial 7.35 - 7.45 - 7.394   PCO2 arterial 32 - 48 mmHg - 37.7   Bicarbonate 20.0 -  28.0 mmol/L - 23.0  25.6   TCO2 22 - 32 mmol/L - 24  27   Acid-base deficit 0.0 - 2.0 mmol/L - 2.0   O2 Saturation % - 94  60     Details       Multiple values from one day are sorted in reverse-chronological order         Capillary Blood Glucose: No results found for: GLUCAP   Exercise Target Goals: Exercise Program Goal: Individual exercise prescription set using results from initial 6 min walk test and THRR while considering  patient's activity barriers and safety.   Exercise Prescription Goal: Initial exercise prescription builds to 30-45 minutes a day of aerobic activity, 2-3 days per week.  Home exercise guidelines will be given to patient during program as part of exercise prescription that the participant will acknowledge.  Activity  Barriers & Risk Stratification:  Activity Barriers & Cardiac Risk Stratification - 01/26/23 1359       Activity Barriers & Cardiac Risk Stratification   Activity Barriers Decreased Ventricular Function;Back Problems    Cardiac Risk Stratification High   <5 METs on            6 Minute Walk:  6 Minute Walk     Row Name 01/26/23 1527         6 Minute Walk   Phase Initial     Distance 1610 feet     Walk Time 6 minutes     # of Rest Breaks 0     MPH 3.05     METS 4.71     RPE 9     Perceived Dyspnea  0     VO2 Peak 16.5     Symptoms No     Resting HR 95 bpm     Resting BP 122/84     Resting Oxygen Saturation  95 %     Exercise Oxygen Saturation  during 6 min walk 95 %     Max Ex. HR 112 bpm     Max Ex. BP 126/72     2 Minute Post BP 122/80              Oxygen Initial Assessment:   Oxygen Re-Evaluation:   Oxygen Discharge (Final Oxygen Re-Evaluation):   Initial Exercise Prescription:  Initial Exercise Prescription - 01/26/23 1500       Date of Initial Exercise RX and Referring Provider   Date 01/26/23    Referring Provider Morene Brownie, MD    Expected Discharge Date 04/22/23      Treadmill   MPH 2.8    Grade 0    Minutes 15    METs 3.2      Elliptical   Level 1    Speed 1    Minutes 15    METs 3      Prescription Details   Frequency (times per week) 3    Duration Progress to 30 minutes of continuous aerobic without signs/symptoms of physical distress      Intensity   THRR 40-80% of Max Heartrate 69-139    Ratings of Perceived Exertion 11-13    Perceived Dyspnea 0-4      Progression   Progression Continue progressive overload as per policy without signs/symptoms or physical distress.      Resistance Training   Training Prescription Yes    Weight 3    Reps 10-15             Perform Capillary  Blood Glucose checks as needed.  Exercise Prescription Changes:   Exercise Prescription Changes     Row Name 02/09/23 1600              Response to Exercise   Blood Pressure (Admit) 118/70       Blood Pressure (Exercise) 134/78       Blood Pressure (Exit) 122/78       Heart Rate (Admit) 99 bpm       Heart Rate (Exercise) 144 bpm       Heart Rate (Exit) 104 bpm       Rating of Perceived Exertion (Exercise) 9       Perceived Dyspnea (Exercise) 0       Symptoms None       Comments Pt first day in CRP2 program       Duration Continue with 30 min of aerobic exercise without signs/symptoms of physical distress.       Intensity THRR unchanged         Progression   Progression Continue to progress workloads to maintain intensity without signs/symptoms of physical distress.       Average METs 4.37         Resistance Training   Training Prescription Yes       Weight 3       Reps 10-15         Treadmill   MPH 2.8       Grade 0       Minutes 15       METs 3.14         Elliptical   Level 1       Speed 1       Minutes 15       METs 5.6                Exercise Comments:   Exercise Comments     Row Name 02/09/23 1701           Exercise Comments Pt first day in CRP2 program. Pt tolerated exercise well with an avg MET level of 4.37 with no s/sx. Pt is learning her THRR, RPE scale, and ExRx. Pt did great and is eager to progress workloads.                Exercise Goals and Review:   Exercise Goals     Row Name 01/26/23 1359             Exercise Goals   Increase Physical Activity Yes       Intervention Provide advice, education, support and counseling about physical activity/exercise needs.;Develop an individualized exercise prescription for aerobic and resistive training based on initial evaluation findings, risk stratification, comorbidities and participant's personal goals.       Expected Outcomes Short Term: Attend rehab on a regular basis to increase amount of physical activity.;Long Term: Exercising regularly at least 3-5 days a week.;Long Term: Add in home exercise to make  exercise part of routine and to increase amount of physical activity.       Increase Strength and Stamina Yes       Intervention Develop an individualized exercise prescription for aerobic and resistive training based on initial evaluation findings, risk stratification, comorbidities and participant's personal goals.;Provide advice, education, support and counseling about physical activity/exercise needs.       Expected Outcomes Short Term: Increase workloads from initial exercise prescription for resistance, speed, and METs.;Short Term: Perform resistance training exercises routinely  during rehab and add in resistance training at home;Long Term: Improve cardiorespiratory fitness, muscular endurance and strength as measured by increased METs and functional capacity ( )       Able to understand and use rate of perceived exertion (RPE) scale Yes       Intervention Provide education and explanation on how to use RPE scale       Expected Outcomes Short Term: Able to use RPE daily in rehab to express subjective intensity level;Long Term:  Able to use RPE to guide intensity level when exercising independently       Knowledge and understanding of Target Heart Rate Range (THRR) Yes       Intervention Provide education and explanation of THRR including how the numbers were predicted and where they are located for reference       Expected Outcomes Short Term: Able to state/look up THRR;Short Term: Able to use daily as guideline for intensity in rehab;Long Term: Able to use THRR to govern intensity when exercising independently       Understanding of Exercise Prescription Yes       Intervention Provide education, explanation, and written materials on patient's individual exercise prescription       Expected Outcomes Short Term: Able to explain program exercise prescription;Long Term: Able to explain home exercise prescription to exercise independently                Exercise Goals Re-Evaluation :   Exercise Goals Re-Evaluation     Row Name 02/09/23 1659             Exercise Goal Re-Evaluation   Exercise Goals Review Increase Physical Activity;Understanding of Exercise Prescription;Increase Strength and Stamina;Knowledge and understanding of Target Heart Rate Range (THRR);Able to understand and use rate of perceived exertion (RPE) scale       Comments Pt first day in CRP2 program. Pt tolerated exercise well with an avg MET level of 4.37 with no s/sx. Pt is learning her THRR, RPE scale, and ExRx. Pt did great and is eager to progress workloads.       Expected Outcomes Will progress workloads as tolerated without s/sx.                Discharge Exercise Prescription (Final Exercise Prescription Changes):  Exercise Prescription Changes - 02/09/23 1600       Response to Exercise   Blood Pressure (Admit) 118/70    Blood Pressure (Exercise) 134/78    Blood Pressure (Exit) 122/78    Heart Rate (Admit) 99 bpm    Heart Rate (Exercise) 144 bpm    Heart Rate (Exit) 104 bpm    Rating of Perceived Exertion (Exercise) 9    Perceived Dyspnea (Exercise) 0    Symptoms None    Comments Pt first day in CRP2 program    Duration Continue with 30 min of aerobic exercise without signs/symptoms of physical distress.    Intensity THRR unchanged      Progression   Progression Continue to progress workloads to maintain intensity without signs/symptoms of physical distress.    Average METs 4.37      Resistance Training   Training Prescription Yes    Weight 3    Reps 10-15      Treadmill   MPH 2.8    Grade 0    Minutes 15    METs 3.14      Elliptical   Level 1    Speed 1    Minutes 15  METs 5.6             Nutrition:  Target Goals: Understanding of nutrition guidelines, daily intake of sodium 1500mg , cholesterol 200mg , calories 30% from fat and 7% or less from saturated fats, daily to have 5 or more servings of fruits and vegetables.  Biometrics:  Pre Biometrics -  01/26/23 1529       Pre Biometrics   Waist Circumference 34 inches    Hip Circumference 43 inches    Waist to Hip Ratio 0.79 %    Triceps Skinfold 23 mm    % Body Fat 36.4 %    Grip Strength 24 kg    Flexibility 19.5 in    Single Leg Stand 30 seconds              Nutrition Therapy Plan and Nutrition Goals:   Nutrition Assessments:  MEDIFICTS Score Key: >=70 Need to make dietary changes  40-70 Heart Healthy Diet <= 40 Therapeutic Level Cholesterol Diet    Picture Your Plate Scores: <59 Unhealthy dietary pattern with much room for improvement. 41-50 Dietary pattern unlikely to meet recommendations for good health and room for improvement. 51-60 More healthful dietary pattern, with some room for improvement.  >60 Healthy dietary pattern, although there may be some specific behaviors that could be improved.    Nutrition Goals Re-Evaluation:   Nutrition Goals Re-Evaluation:   Nutrition Goals Discharge (Final Nutrition Goals Re-Evaluation):   Psychosocial: Target Goals: Acknowledge presence or absence of significant depression and/or stress, maximize coping skills, provide positive support system. Participant is able to verbalize types and ability to use techniques and skills needed for reducing stress and depression.  Initial Review & Psychosocial Screening:  Initial Psych Review & Screening - 01/26/23 1530       Initial Review   Current issues with Current Anxiety/Panic;Current Stress Concerns    Source of Stress Concerns Chronic Illness    Comments Hannah Bullock shared that she occassional anxiety and some stress associated with her recent diagnosis while balancing work and school. She states that her current medications are working and she has a paramedic but isn't actively seeing them. Denies feelings of depression or need for additional resources.      Family Dynamics   Good Support System? Yes   Hannah Bullock has her husband for support     Barriers   Psychosocial  barriers to participate in program The patient should benefit from training in stress management and relaxation.      Screening Interventions   Interventions To provide support and resources with identified psychosocial needs;Provide feedback about the scores to participant;Encouraged to exercise    Expected Outcomes Long Term goal: The participant improves quality of Life and PHQ9 Scores as seen by post scores and/or verbalization of changes;Short Term goal: Identification and review with participant of any Quality of Life or Depression concerns found by scoring the questionnaire.;Long Term Goal: Stressors or current issues are controlled or eliminated.;Short Term goal: Utilizing psychosocial counselor, staff and physician to assist with identification of specific Stressors or current issues interfering with healing process. Setting desired goal for each stressor or current issue identified.             Quality of Life Scores:  Quality of Life - 01/26/23 1422       Quality of Life   Select Quality of Life      Quality of Life Scores   Health/Function Pre 15.93 %    Socioeconomic Pre 22.36 %  Psych/Spiritual Pre 17.58 %    Family Pre 10.13 %    GLOBAL Pre 16.92 %            Scores of 19 and below usually indicate a poorer quality of life in these areas.  A difference of  2-3 points is a clinically meaningful difference.  A difference of 2-3 points in the total score of the Quality of Life Index has been associated with significant improvement in overall quality of life, self-image, physical symptoms, and general health in studies assessing change in quality of life.  PHQ-9: Review Flowsheet       01/26/2023  Depression screen PHQ 2/9  Decreased Interest 0  Down, Depressed, Hopeless 0  PHQ - 2 Score 0  Altered sleeping 0  Tired, decreased energy 0  Change in appetite 0  Feeling bad or failure about yourself  0  Trouble concentrating 0  Moving slowly or fidgety/restless  0  Suicidal thoughts 0  PHQ-9 Score 0   Interpretation of Total Score  Total Score Depression Severity:  1-4 = Minimal depression, 5-9 = Mild depression, 10-14 = Moderate depression, 15-19 = Moderately severe depression, 20-27 = Severe depression   Psychosocial Evaluation and Intervention:   Psychosocial Re-Evaluation:  Psychosocial Re-Evaluation     Row Name 02/12/23 0843 02/17/23 1247           Psychosocial Re-Evaluation   Current issues with Current Stress Concerns;Current Anxiety/Panic Current Stress Concerns;Current Anxiety/Panic      Comments Hannah Bullock started cardiac rehab on 02/09/23. Hannah Bullock did not voice any increased concerns or stressors on her first day of exercise. Will review quality of life questionnaire in the upcoming week Reviewed quality of life questionaire on 02/16/23.Hannah Bullock says she is dissatisfied with her health due to her CHF diagnosis. Hannah Bullock says that her shortness of breath has improved some. Hannah Bullock denies being depressed currently. Hannah Bullock does have a therapist that she meets with twice a month.      Expected Outcomes Hannah Bullock will have controlled or decreased stressors/ anxiety upon completion of cardiac rehab Hannah Bullock will have controlled or decreased stressors/ anxiety upon completion of cardiac rehab      Interventions Stress management education;Encouraged to attend Cardiac Rehabilitation for the exercise;Relaxation education Stress management education;Encouraged to attend Cardiac Rehabilitation for the exercise;Relaxation education      Continue Psychosocial Services  Follow up required by staff Follow up required by staff        Initial Review   Source of Stress Concerns Chronic Illness Chronic Illness      Comments Will continue to monitor and offer support as needed. Will continue to monitor and offer support as needed.               Psychosocial Discharge (Final Psychosocial Re-Evaluation):  Psychosocial Re-Evaluation - 02/17/23 1247       Psychosocial  Re-Evaluation   Current issues with Current Stress Concerns;Current Anxiety/Panic    Comments Reviewed quality of life questionaire on 02/16/23.Hannah Bullock says she is dissatisfied with her health due to her CHF diagnosis. Hannah Bullock says that her shortness of breath has improved some. Hannah Bullock denies being depressed currently. Hannah Bullock does have a therapist that she meets with twice a month.    Expected Outcomes Terina will have controlled or decreased stressors/ anxiety upon completion of cardiac rehab    Interventions Stress management education;Encouraged to attend Cardiac Rehabilitation for the exercise;Relaxation education    Continue Psychosocial Services  Follow up required by staff  Initial Review   Source of Stress Concerns Chronic Illness    Comments Will continue to monitor and offer support as needed.             Vocational Rehabilitation: Provide vocational rehab assistance to qualifying candidates.   Vocational Rehab Evaluation & Intervention:  Vocational Rehab - 01/26/23 1400       Initial Vocational Rehab Evaluation & Intervention   Assessment shows need for Vocational Rehabilitation No   Hannah Bullock is working            Education: Education Goals: Education classes will be provided on a weekly basis, covering required topics. Participant will state understanding/return demonstration of topics presented.    Education     Row Name 02/09/23 1500     Education   Cardiac Education Topics Pritikin   Geographical Information Systems Officer Psychosocial   Psychosocial Workshop Focused Goals, Sustainable Changes   Instruction Review Code 1- Verbalizes Understanding   Class Start Time 1400   Class Stop Time 1440   Class Time Calculation (min) 40 min    Row Name 02/16/23 1400     Education   Cardiac Education Topics Pritikin   Psychologist, Forensic Exercise Education   Exercise  Education Biomechanial Limitations   Instruction Review Code 1- Verbalizes Understanding   Class Start Time 1400   Class Stop Time 1440   Class Time Calculation (min) 40 min            Core Videos: Exercise    Move It!  Clinical staff conducted group or individual video education with verbal and written material and guidebook.  Patient learns the recommended Pritikin exercise program. Exercise with the goal of living a long, healthy life. Some of the health benefits of exercise include controlled diabetes, healthier blood pressure levels, improved cholesterol levels, improved heart and lung capacity, improved sleep, and better body composition. Everyone should speak with their doctor before starting or changing an exercise routine.  Biomechanical Limitations Clinical staff conducted group or individual video education with verbal and written material and guidebook.  Patient learns how biomechanical limitations can impact exercise and how we can mitigate and possibly overcome limitations to have an impactful and balanced exercise routine.  Body Composition Clinical staff conducted group or individual video education with verbal and written material and guidebook.  Patient learns that body composition (ratio of muscle mass to fat mass) is a key component to assessing overall fitness, rather than body weight alone. Increased fat mass, especially visceral belly fat, can put us  at increased risk for metabolic syndrome, type 2 diabetes, heart disease, and even death. It is recommended to combine diet and exercise (cardiovascular and resistance training) to improve your body composition. Seek guidance from your physician and exercise physiologist before implementing an exercise routine.  Exercise Action Plan Clinical staff conducted group or individual video education with verbal and written material and guidebook.  Patient learns the recommended strategies to achieve and enjoy long-term  exercise adherence, including variety, self-motivation, self-efficacy, and positive decision making. Benefits of exercise include fitness, good health, weight management, more energy, better sleep, less stress, and overall well-being.  Medical   Heart Disease Risk Reduction Clinical staff conducted group or individual video education with verbal and written material and guidebook.  Patient learns our heart is our most vital organ as it circulates oxygen,  nutrients, white blood cells, and hormones throughout the entire body, and carries waste away. Data supports a plant-based eating plan like the Pritikin Program for its effectiveness in slowing progression of and reversing heart disease. The video provides a number of recommendations to address heart disease.   Metabolic Syndrome and Belly Fat  Clinical staff conducted group or individual video education with verbal and written material and guidebook.  Patient learns what metabolic syndrome is, how it leads to heart disease, and how one can reverse it and keep it from coming back. You have metabolic syndrome if you have 3 of the following 5 criteria: abdominal obesity, high blood pressure, high triglycerides, low HDL cholesterol, and high blood sugar.  Hypertension and Heart Disease Clinical staff conducted group or individual video education with verbal and written material and guidebook.  Patient learns that high blood pressure, or hypertension, is very common in the [place] . Hypertension is largely due to excessive salt intake, but other important risk factors include being overweight, physical inactivity, drinking too much alcohol, smoking, and not eating enough potassium from fruits and vegetables. High blood pressure is a leading risk factor for heart attack, stroke, congestive heart failure, dementia, kidney failure, and premature death. Long-term effects of excessive salt intake include stiffening of the arteries and thickening of heart  muscle and organ damage. Recommendations include ways to reduce hypertension and the risk of heart disease.  Diseases of Our Time - Focusing on Diabetes Clinical staff conducted group or individual video education with verbal and written material and guidebook.  Patient learns why the best way to stop diseases of our time is prevention, through food and other lifestyle changes. Medicine (such as prescription pills and surgeries) is often only a Band-Aid on the problem, not a long-term solution. Most common diseases of our time include obesity, type 2 diabetes, hypertension, heart disease, and cancer. The Pritikin Program is recommended and has been proven to help reduce, reverse, and/or prevent the damaging effects of metabolic syndrome.  Nutrition   Overview of the Pritikin Eating Plan  Clinical staff conducted group or individual video education with verbal and written material and guidebook.  Patient learns about the Pritikin Eating Plan for disease risk reduction. The Pritikin Eating Plan emphasizes a wide variety of unrefined, minimally-processed carbohydrates, like fruits, vegetables, whole grains, and legumes. Go, Caution, and Stop food choices are explained. Plant-based and lean animal proteins are emphasized. Rationale provided for low sodium intake for blood pressure control, low added sugars for blood sugar stabilization, and low added fats and oils for coronary artery disease risk reduction and weight management.  Calorie Density  Clinical staff conducted group or individual video education with verbal and written material and guidebook.  Patient learns about calorie density and how it impacts the Pritikin Eating Plan. Knowing the characteristics of the food you choose will help you decide whether those foods will lead to weight gain or weight loss, and whether you want to consume more or less of them. Weight loss is usually a side effect of the Pritikin Eating Plan because of its focus on  low calorie-dense foods.  Label Reading  Clinical staff conducted group or individual video education with verbal and written material and guidebook.  Patient learns about the Pritikin recommended label reading guidelines and corresponding recommendations regarding calorie density, added sugars, sodium content, and whole grains.  Dining Out - Part 1  Clinical staff conducted group or individual video education with verbal and written material and guidebook.  Patient learns that restaurant meals can be sabotaging because they can be so high in calories, fat, sodium, and/or sugar. Patient learns recommended strategies on how to positively address this and avoid unhealthy pitfalls.  Facts on Fats  Clinical staff conducted group or individual video education with verbal and written material and guidebook.  Patient learns that lifestyle modifications can be just as effective, if not more so, as many medications for lowering your risk of heart disease. A Pritikin lifestyle can help to reduce your risk of inflammation and atherosclerosis (cholesterol build-up, or plaque, in the artery walls). Lifestyle interventions such as dietary choices and physical activity address the cause of atherosclerosis. A review of the types of fats and their impact on blood cholesterol levels, along with dietary recommendations to reduce fat intake is also included.  Nutrition Action Plan  Clinical staff conducted group or individual video education with verbal and written material and guidebook.  Patient learns how to incorporate Pritikin recommendations into their lifestyle. Recommendations include planning and keeping personal health goals in mind as an important part of their success.  Healthy Mind-Set    Healthy Minds, Bodies, Hearts  Clinical staff conducted group or individual video education with verbal and written material and guidebook.  Patient learns how to identify when they are stressed. Video will discuss  the impact of that stress, as well as the many benefits of stress management. Patient will also be introduced to stress management techniques. The way we think, act, and feel has an impact on our hearts.  How Our Thoughts Can Heal Our Hearts  Clinical staff conducted group or individual video education with verbal and written material and guidebook.  Patient learns that negative thoughts can cause depression and anxiety. This can result in negative lifestyle behavior and serious health problems. Cognitive behavioral therapy is an effective method to help control our thoughts in order to change and improve our emotional outlook.  Additional Videos:  Exercise    Improving Performance  Clinical staff conducted group or individual video education with verbal and written material and guidebook.  Patient learns to use a non-linear approach by alternating intensity levels and lengths of time spent exercising to help burn more calories and lose more body fat. Cardiovascular exercise helps improve heart health, metabolism, hormonal balance, blood sugar control, and recovery from fatigue. Resistance training improves strength, endurance, balance, coordination, reaction time, metabolism, and muscle mass. Flexibility exercise improves circulation, posture, and balance. Seek guidance from your physician and exercise physiologist before implementing an exercise routine and learn your capabilities and proper form for all exercise.  Introduction to Yoga  Clinical staff conducted group or individual video education with verbal and written material and guidebook.  Patient learns about yoga, a discipline of the coming together of mind, breath, and body. The benefits of yoga include improved flexibility, improved range of motion, better posture and core strength, increased lung function, weight loss, and positive self-image. Yoga's heart health benefits include lowered blood pressure, healthier heart rate, decreased  cholesterol and triglyceride levels, improved immune function, and reduced stress. Seek guidance from your physician and exercise physiologist before implementing an exercise routine and learn your capabilities and proper form for all exercise.  Medical   Aging: Enhancing Your Quality of Life  Clinical staff conducted group or individual video education with verbal and written material and guidebook.  Patient learns key strategies and recommendations to stay in good physical health and enhance quality of life, such as prevention strategies, having an advocate,  securing a Health Care Proxy and Power of Attorney, and keeping a list of medications and system for tracking them. It also discusses how to avoid risk for bone loss.  Biology of Weight Control  Clinical staff conducted group or individual video education with verbal and written material and guidebook.  Patient learns that weight gain occurs because we consume more calories than we burn (eating more, moving less). Even if your body weight is normal, you may have higher ratios of fat compared to muscle mass. Too much body fat puts you at increased risk for cardiovascular disease, heart attack, stroke, type 2 diabetes, and obesity-related cancers. In addition to exercise, following the Pritikin Eating Plan can help reduce your risk.  Decoding Lab Results  Clinical staff conducted group or individual video education with verbal and written material and guidebook.  Patient learns that lab test reflects one measurement whose values change over time and are influenced by many factors, including medication, stress, sleep, exercise, food, hydration, pre-existing medical conditions, and more. It is recommended to use the knowledge from this video to become more involved with your lab results and evaluate your numbers to speak with your doctor.   Diseases of Our Time - Overview  Clinical staff conducted group or individual video education with verbal  and written material and guidebook.  Patient learns that according to the CDC, 50% to 70% of chronic diseases (such as obesity, type 2 diabetes, elevated lipids, hypertension, and heart disease) are avoidable through lifestyle improvements including healthier food choices, listening to satiety cues, and increased physical activity.  Sleep Disorders Clinical staff conducted group or individual video education with verbal and written material and guidebook.  Patient learns how good quality and duration of sleep are important to overall health and well-being. Patient also learns about sleep disorders and how they impact health along with recommendations to address them, including discussing with a physician.  Nutrition  Dining Out - Part 2 Clinical staff conducted group or individual video education with verbal and written material and guidebook.  Patient learns how to plan ahead and communicate in order to maximize their dining experience in a healthy and nutritious manner. Included are recommended food choices based on the type of restaurant the patient is visiting.   Fueling a Banker conducted group or individual video education with verbal and written material and guidebook.  There is a strong connection between our food choices and our health. Diseases like obesity and type 2 diabetes are very prevalent and are in large-part due to lifestyle choices. The Pritikin Eating Plan provides plenty of food and hunger-curbing satisfaction. It is easy to follow, affordable, and helps reduce health risks.  Menu Workshop  Clinical staff conducted group or individual video education with verbal and written material and guidebook.  Patient learns that restaurant meals can sabotage health goals because they are often packed with calories, fat, sodium, and sugar. Recommendations include strategies to plan ahead and to communicate with the manager, chef, or server to help order a healthier  meal.  Planning Your Eating Strategy  Clinical staff conducted group or individual video education with verbal and written material and guidebook.  Patient learns about the Pritikin Eating Plan and its benefit of reducing the risk of disease. The Pritikin Eating Plan does not focus on calories. Instead, it emphasizes high-quality, nutrient-rich foods. By knowing the characteristics of the foods, we choose, we can determine their calorie density and make informed decisions.  Targeting Your Nutrition  Priorities  Clinical staff conducted group or individual video education with verbal and written material and guidebook.  Patient learns that lifestyle habits have a tremendous impact on disease risk and progression. This video provides eating and physical activity recommendations based on your personal health goals, such as reducing LDL cholesterol, losing weight, preventing or controlling type 2 diabetes, and reducing high blood pressure.  Vitamins and Minerals  Clinical staff conducted group or individual video education with verbal and written material and guidebook.  Patient learns different ways to obtain key vitamins and minerals, including through a recommended healthy diet. It is important to discuss all supplements you take with your doctor.   Healthy Mind-Set    Smoking Cessation  Clinical staff conducted group or individual video education with verbal and written material and guidebook.  Patient learns that cigarette smoking and tobacco addiction pose a serious health risk which affects millions of people. Stopping smoking will significantly reduce the risk of heart disease, lung disease, and many forms of cancer. Recommended strategies for quitting are covered, including working with your doctor to develop a successful plan.  Culinary   Becoming a Set Designer conducted group or individual video education with verbal and written material and guidebook.  Patient learns  that cooking at home can be healthy, cost-effective, quick, and puts them in control. Keys to cooking healthy recipes will include looking at your recipe, assessing your equipment needs, planning ahead, making it simple, choosing cost-effective seasonal ingredients, and limiting the use of added fats, salts, and sugars.  Cooking - Breakfast and Snacks  Clinical staff conducted group or individual video education with verbal and written material and guidebook.  Patient learns how important breakfast is to satiety and nutrition through the entire day. Recommendations include key foods to eat during breakfast to help stabilize blood sugar levels and to prevent overeating at meals later in the day. Planning ahead is also a key component.  Cooking - Educational Psychologist conducted group or individual video education with verbal and written material and guidebook.  Patient learns eating strategies to improve overall health, including an approach to cook more at home. Recommendations include thinking of animal protein as a side on your plate rather than center stage and focusing instead on lower calorie dense options like vegetables, fruits, whole grains, and plant-based proteins, such as beans. Making sauces in large quantities to freeze for later and leaving the skin on your vegetables are also recommended to maximize your experience.  Cooking - Healthy Salads and Dressing Clinical staff conducted group or individual video education with verbal and written material and guidebook.  Patient learns that vegetables, fruits, whole grains, and legumes are the foundations of the Pritikin Eating Plan. Recommendations include how to incorporate each of these in flavorful and healthy salads, and how to create homemade salad dressings. Proper handling of ingredients is also covered. Cooking - Soups and [employer] - Soups and Desserts Clinical staff conducted group or individual video education with  verbal and written material and guidebook.  Patient learns that Pritikin soups and desserts make for easy, nutritious, and delicious snacks and meal components that are low in sodium, fat, sugar, and calorie density, while high in vitamins, minerals, and filling fiber. Recommendations include simple and healthy ideas for soups and desserts.   Overview     The Pritikin Solution Program Overview Clinical staff conducted group or individual video education with verbal and written material and guidebook.  Patient  learns that the results of the Pritikin Program have been documented in more than 100 articles published in peer-reviewed journals, and the benefits include reducing risk factors for (and, in some cases, even reversing) high cholesterol, high blood pressure, type 2 diabetes, obesity, and more! An overview of the three key pillars of the Pritikin Program will be covered: eating well, doing regular exercise, and having a healthy mind-set.  WORKSHOPS  Exercise: Exercise Basics: Building Your Action Plan Clinical staff led group instruction and group discussion with PowerPoint presentation and patient guidebook. To enhance the learning environment the use of posters, models and videos may be added. At the conclusion of this workshop, patients will comprehend the difference between physical activity and exercise, as well as the benefits of incorporating both, into their routine. Patients will understand the FITT (Frequency, Intensity, Time, and Type) principle and how to use it to build an exercise action plan. In addition, safety concerns and other considerations for exercise and cardiac rehab will be addressed by the presenter. The purpose of this lesson is to promote a comprehensive and effective weekly exercise routine in order to improve patients' overall level of fitness.   Managing Heart Disease: Your Path to a Healthier Heart Clinical staff led group instruction and group discussion with  PowerPoint presentation and patient guidebook. To enhance the learning environment the use of posters, models and videos may be added.At the conclusion of this workshop, patients will understand the anatomy and physiology of the heart. Additionally, they will understand how Pritikin's three pillars impact the risk factors, the progression, and the management of heart disease.  The purpose of this lesson is to provide a high-level overview of the heart, heart disease, and how the Pritikin lifestyle positively impacts risk factors.  Exercise Biomechanics Clinical staff led group instruction and group discussion with PowerPoint presentation and patient guidebook. To enhance the learning environment the use of posters, models and videos may be added. Patients will learn how the structural parts of their bodies function and how these functions impact their daily activities, movement, and exercise. Patients will learn how to promote a neutral spine, learn how to manage pain, and identify ways to improve their physical movement in order to promote healthy living. The purpose of this lesson is to expose patients to common physical limitations that impact physical activity. Participants will learn practical ways to adapt and manage aches and pains, and to minimize their effect on regular exercise. Patients will learn how to maintain good posture while sitting, walking, and lifting.  Balance Training and Fall Prevention  Clinical staff led group instruction and group discussion with PowerPoint presentation and patient guidebook. To enhance the learning environment the use of posters, models and videos may be added. At the conclusion of this workshop, patients will understand the importance of their sensorimotor skills (vision, proprioception, and the vestibular system) in maintaining their ability to balance as they age. Patients will apply a variety of balancing exercises that are appropriate for their  current level of function. Patients will understand the common causes for poor balance, possible solutions to these problems, and ways to modify their physical environment in order to minimize their fall risk. The purpose of this lesson is to teach patients about the importance of maintaining balance as they age and ways to minimize their risk of falling.  WORKSHOPS   Nutrition:  Fueling a Ship Broker led group instruction and group discussion with PowerPoint presentation and patient guidebook. To enhance the learning environment  the use of posters, models and videos may be added. Patients will review the foundational principles of the Pritikin Eating Plan and understand what constitutes a serving size in each of the food groups. Patients will also learn Pritikin-friendly foods that are better choices when away from home and review make-ahead meal and snack options. Calorie density will be reviewed and applied to three nutrition priorities: weight maintenance, weight loss, and weight gain. The purpose of this lesson is to reinforce (in a group setting) the key concepts around what patients are recommended to eat and how to apply these guidelines when away from home by planning and selecting Pritikin-friendly options. Patients will understand how calorie density may be adjusted for different weight management goals.  Mindful Eating  Clinical staff led group instruction and group discussion with PowerPoint presentation and patient guidebook. To enhance the learning environment the use of posters, models and videos may be added. Patients will briefly review the concepts of the Pritikin Eating Plan and the importance of low-calorie dense foods. The concept of mindful eating will be introduced as well as the importance of paying attention to internal hunger signals. Triggers for non-hunger eating and techniques for dealing with triggers will be explored. The purpose of this lesson is to provide  patients with the opportunity to review the basic principles of the Pritikin Eating Plan, discuss the value of eating mindfully and how to measure internal cues of hunger and fullness using the Hunger Scale. Patients will also discuss reasons for non-hunger eating and learn strategies to use for controlling emotional eating.  Targeting Your Nutrition Priorities Clinical staff led group instruction and group discussion with PowerPoint presentation and patient guidebook. To enhance the learning environment the use of posters, models and videos may be added. Patients will learn how to determine their genetic susceptibility to disease by reviewing their family history. Patients will gain insight into the importance of diet as part of an overall healthy lifestyle in mitigating the impact of genetics and other environmental insults. The purpose of this lesson is to provide patients with the opportunity to assess their personal nutrition priorities by looking at their family history, their own health history and current risk factors. Patients will also be able to discuss ways of prioritizing and modifying the Pritikin Eating Plan for their highest risk areas  Menu  Clinical staff led group instruction and group discussion with PowerPoint presentation and patient guidebook. To enhance the learning environment the use of posters, models and videos may be added. Using menus brought in from [employer], or printed from [employer], patients will apply the Pritikin dining out guidelines that were presented in the [employer] video. Patients will also be able to practice these guidelines in a variety of provided scenarios. The purpose of this lesson is to provide patients with the opportunity to practice hands-on learning of the Pritikin Dining Out guidelines with actual menus and practice scenarios.  Label Reading Clinical staff led group instruction and group discussion with PowerPoint  presentation and patient guidebook. To enhance the learning environment the use of posters, models and videos may be added. Patients will review and discuss the Pritikin label reading guidelines presented in Pritikin's Label Reading Educational series video. Using fool labels brought in from local grocery stores and markets, patients will apply the label reading guidelines and determine if the packaged food meet the Pritikin guidelines. The purpose of this lesson is to provide patients with the opportunity to review, discuss, and practice hands-on  learning of the Pritikin Label Reading guidelines with actual packaged food labels. Cooking School  Pritikin's [employer] are designed to teach patients ways to prepare quick, simple, and affordable recipes at home. The importance of nutrition's role in chronic disease risk reduction is reflected in its emphasis in the overall Pritikin program. By learning how to prepare essential core Pritikin Eating Plan recipes, patients will increase control over what they eat; be able to customize the flavor of foods without the use of added salt, sugar, or fat; and improve the quality of the food they consume. By learning a set of core recipes which are easily assembled, quickly prepared, and affordable, patients are more likely to prepare more healthy foods at home. These workshops focus on convenient breakfasts, simple entres, side dishes, and desserts which can be prepared with minimal effort and are consistent with nutrition recommendations for cardiovascular risk reduction. Cooking [employer] are taught by a armed forces logistics/support/administrative officer (RD) who has been trained by the [employer]. The chef or RD has a clear understanding of the importance of minimizing - if not completely eliminating - added fat, sugar, and sodium in recipes. Throughout the series of Cooking School Workshop sessions, patients will learn about healthy ingredients and  efficient methods of cooking to build confidence in their capability to prepare    Cooking School weekly topics:  Adding Flavor- Sodium-Free  Fast and Healthy Breakfasts  Powerhouse Plant-Based Proteins  Satisfying Salads and Dressings  Simple Sides and Sauces  International Cuisine-Spotlight on the [employer] Zones  Delicious Desserts  Savory Soups  [employer] - Meals in a Astronomer Appetizers and Snacks  Comforting Weekend Breakfasts  One-Pot Wonders   Fast Evening Meals  Landscape Architect Your Pritikin Plate  WORKSHOPS   Healthy Mindset (Psychosocial):  Focused Goals, Sustainable Changes Clinical staff led group instruction and group discussion with PowerPoint presentation and patient guidebook. To enhance the learning environment the use of posters, models and videos may be added. Patients will be able to apply effective goal setting strategies to establish at least one personal goal, and then take consistent, meaningful action toward that goal. They will learn to identify common barriers to achieving personal goals and develop strategies to overcome them. Patients will also gain an understanding of how our mind-set can impact our ability to achieve goals and the importance of cultivating a positive and growth-oriented mind-set. The purpose of this lesson is to provide patients with a deeper understanding of how to set and achieve personal goals, as well as the tools and strategies needed to overcome common obstacles which may arise along the way.  From Head to Heart: The Power of a Healthy Outlook  Clinical staff led group instruction and group discussion with PowerPoint presentation and patient guidebook. To enhance the learning environment the use of posters, models and videos may be added. Patients will be able to recognize and describe the impact of emotions and mood on physical health. They will discover the importance of self-care and explore self-care  practices which may work for them. Patients will also learn how to utilize the 4 C's to cultivate a healthier outlook and better manage stress and challenges. The purpose of this lesson is to demonstrate to patients how a healthy outlook is an essential part of maintaining good health, especially as they continue their cardiac rehab journey.  Healthy Sleep for a Healthy Heart Clinical staff led group instruction and group discussion with PowerPoint presentation  and patient guidebook. To enhance the learning environment the use of posters, models and videos may be added. At the conclusion of this workshop, patients will be able to demonstrate knowledge of the importance of sleep to overall health, well-being, and quality of life. They will understand the symptoms of, and treatments for, common sleep disorders. Patients will also be able to identify daytime and nighttime behaviors which impact sleep, and they will be able to apply these tools to help manage sleep-related challenges. The purpose of this lesson is to provide patients with a general overview of sleep and outline the importance of quality sleep. Patients will learn about a few of the most common sleep disorders. Patients will also be introduced to the concept of "sleep hygiene," and discover ways to self-manage certain sleeping problems through simple daily behavior changes. Finally, the workshop will motivate patients by clarifying the links between quality sleep and their goals of heart-healthy living.   Recognizing and Reducing Stress Clinical staff led group instruction and group discussion with PowerPoint presentation and patient guidebook. To enhance the learning environment the use of posters, models and videos may be added. At the conclusion of this workshop, patients will be able to understand the types of stress reactions, differentiate between acute and chronic stress, and recognize the impact that chronic stress has on their health. They  will also be able to apply different coping mechanisms, such as reframing negative self-talk. Patients will have the opportunity to practice a variety of stress management techniques, such as deep abdominal breathing, progressive muscle relaxation, and/or guided imagery.  The purpose of this lesson is to educate patients on the role of stress in their lives and to provide healthy techniques for coping with it.  Learning Barriers/Preferences:  Learning Barriers/Preferences - 01/26/23 1400       Learning Barriers/Preferences   Learning Barriers Sight   glasses   Learning Preferences Audio;Computer/Internet;Group Instruction;Individual Instruction;Pictoral;Skilled Demonstration;Verbal Instruction;Video;Written Material             Education Topics:  Knowledge Questionnaire Score:  Knowledge Questionnaire Score - 01/26/23 1417       Knowledge Questionnaire Score   Pre Score 23/24             Core Components/Risk Factors/Patient Goals at Admission:  Personal Goals and Risk Factors at Admission - 01/26/23 1400       Core Components/Risk Factors/Patient Goals on Admission    Weight Management Yes;Weight Loss   pt goal   Intervention Weight Management: Develop a combined nutrition and exercise program designed to reach desired caloric intake, while maintaining appropriate intake of nutrient and fiber, sodium and fats, and appropriate energy expenditure required for the weight goal.;Weight Management: Provide education and appropriate resources to help participant work on and attain dietary goals.    Expected Outcomes Short Term: Continue to assess and modify interventions until short term weight is achieved;Long Term: Adherence to nutrition and physical activity/exercise program aimed toward attainment of established weight goal;Weight Loss: Understanding of general recommendations for a balanced deficit meal plan, which promotes 1-2 lb weight loss per week and includes a negative  energy balance of [phone] kcal/d;Understanding recommendations for meals to include 15-35% energy as protein, 25-35% energy from fat, 35-60% energy from carbohydrates, less than 200mg  of dietary cholesterol, 20-35 gm of total fiber daily;Understanding of distribution of calorie intake throughout the day with the consumption of 4-5 meals/snacks    Heart Failure Yes    Intervention Provide a combined exercise and nutrition program that  is supplemented with education, support and counseling about heart failure. Directed toward relieving symptoms such as shortness of breath, decreased exercise tolerance, and extremity edema.    Expected Outcomes Improve functional capacity of life;Short term: Attendance in program 2-3 days a week with increased exercise capacity. Reported lower sodium intake. Reported increased fruit and vegetable intake. Reports medication compliance.;Short term: Daily weights obtained and reported for increase. Utilizing diuretic protocols set by physician.;Long term: Adoption of self-care skills and reduction of barriers for early signs and symptoms recognition and intervention leading to self-care maintenance.    Hypertension Yes    Intervention Provide education on lifestyle modifcations including regular physical activity/exercise, weight management, moderate sodium restriction and increased consumption of fresh fruit, vegetables, and low fat dairy, alcohol moderation, and smoking cessation.;Monitor prescription use compliance.    Expected Outcomes Short Term: Continued assessment and intervention until BP is < 140/53mm HG in hypertensive participants. < 130/71mm HG in hypertensive participants with diabetes, heart failure or chronic kidney disease.;Long Term: Maintenance of blood pressure at goal levels.    Stress Yes    Intervention Offer individual and/or small group education and counseling on adjustment to heart disease, stress management and health-related lifestyle change. Teach  and support self-help strategies.;Refer participants experiencing significant psychosocial distress to appropriate mental health specialists for further evaluation and treatment. When possible, include family members and significant others in education/counseling sessions.    Expected Outcomes Short Term: Participant demonstrates changes in health-related behavior, relaxation and other stress management skills, ability to obtain effective social support, and compliance with psychotropic medications if prescribed.;Long Term: Emotional wellbeing is indicated by absence of clinically significant psychosocial distress or social isolation.             Core Components/Risk Factors/Patient Goals Review:   Goals and Risk Factor Review     Row Name 02/12/23 0849 02/17/23 1249           Core Components/Risk Factors/Patient Goals Review   Personal Goals Review Weight Management/Obesity;Heart Failure;Hypertension;Stress Weight Management/Obesity;Heart Failure;Hypertension;Stress      Review Hannah Bullock started cardiac rehab on 02/09/23. Hannah Bullock did very  well with exercise. Vital signs were stable. Hannah Bullock is off to a good start to exercise.. Vital signs have been stable. Hannah Bullock has been increasing her met levels.      Expected Outcomes Hannah Bullock will continue to participate in cardiac rehab for exercise, nutrition and lifestyle modifications Hannah Bullock will continue to participate in cardiac rehab for exercise, nutrition and lifestyle modifications               Core Components/Risk Factors/Patient Goals at Discharge (Final Review):   Goals and Risk Factor Review - 02/17/23 1249       Core Components/Risk Factors/Patient Goals Review   Personal Goals Review Weight Management/Obesity;Heart Failure;Hypertension;Stress    Review Hannah Bullock is off to a good start to exercise.. Vital signs have been stable. Hannah Bullock has been increasing her met levels.    Expected Outcomes Hannah Bullock will continue to participate in cardiac rehab for  exercise, nutrition and lifestyle modifications             ITP Comments:  ITP Comments     Row Name 01/26/23 1346 02/12/23 0827 02/17/23 1245       ITP Comments Dr. Wilbert Bihari medical director. Introduction to pritikin education/ intensive cardiac rehab. Initial orientation packet reviewed with patient. 30 Day ITP Review. Jashay started cardiac rehab on 02/09/23. Hannah Bullock did very well with exercise. 30 Day ITP Review. Hannah Bullock started cardiac rehab  on 02/09/23. Hannah Bullock is off to a good start with exercise.              Comments: See ITP comments.Hannah Elpidio Quan RN BSN

## 2023-02-17 NOTE — Patient Instructions (Signed)
 Medication Changes:  No Changes In Medications at this time.   Follow-Up in: 2 months with Echo as scheduled   At the [facility], you and your health needs are our priority. We have a designated team specialized in the treatment of Heart Failure. This Care Team includes your primary Heart Failure Specialized Cardiologist (physician), Advanced Practice Providers (APPs- Physician Assistants and Nurse Practitioners), and Pharmacist who all work together to provide you with the care you need, when you need it.   You may see any of the following providers on your designated Care Team at your next follow up:  Dr. Toribio Fuel Dr. Ezra Shuck Dr. Ria Commander Dr. Odis Brownie Greig Mosses, NP Caffie Shed, [place] [facility] [place], [place] Beckey Coe, NP Jordan Lee, NP Tinnie Redman, PharmD   Please be sure to bring in all your medications bottles to every appointment.   Need to Contact Us :  If you have any questions or concerns before your next appointment please send us  a message through [place] or call our office at [phone].    TO LEAVE A MESSAGE FOR THE NURSE SELECT OPTION 2, PLEASE LEAVE A MESSAGE INCLUDING: YOUR NAME DATE OF BIRTH CALL BACK NUMBER REASON FOR CALL**this is important as we prioritize the call backs  YOU WILL RECEIVE A CALL BACK THE SAME DAY AS LONG AS YOU CALL BEFORE 4:00 PM

## 2023-02-17 NOTE — Progress Notes (Signed)
 ADVANCED HEART FAILURE FOLLOW UP CLINIC NOTE  Referring Physician: Ilah Crigler, MD  Primary Care: Ilah Crigler, MD Primary Cardiologist:  HPI: Hannah Bullock is a 48 y.o. female with a PMH of HTN, chronic ETOH use disorder, generalized anxiety disorder on Adderal, h/o gastric ulcers/GIB, tobacco use, HFrEF who presents for follow up of chronic systolic heart failure.      No previous cardiac history, presented to the ED 10/24 w/ complaints of increasing exertional dyspnea, dry cough and congestion. In the ED was noted to be tachycardic and hypertensive.  Echo showed severely reduced LVEF 25-30%, global HK, LV mildly dilated, RV normal. She was diuresed w/ IV Lasix  and placed on HF GDMT/ antihypertensive regimen. Underwent R/LHC which demonstrated no significant coronary disease, mildly elevated left and right heart filling pressures (RA/RVEDP 10 mmHg, PCWP 15 mmHg, LVEDP 18 mmHg and mild to moderately reduced Fick cardiac output/index (CO 3.6 L/min, CI 2.0 L/min/m^2). During the case, she was note to have transient hypotension suspected to be due to combination of sedation and vasovagal response, successfully treated with normal saline and phenylephrine  boluses. She was diuresed further and she was started all 4 pillars of goal-directed medical therapy, however it was noted that several dose reductions were made due to soft BP. She was referred to [facility] clinic at d/c. D/c wt was 167 lb.         SUBJECTIVE:  Patient more tachycardic and lightheaded today. She was at cardiac rehab yesterday and also took a lasix  pill given weight gain of three pounds. She had lost 5 pounds by the next day and noted her heart rate was in the 170s when she was in the shower. She otherwise has been feeling well without complaints and has been tolerating cardiac rehab well. Feeling better after drinking something.   PMH, current medications, allergies, social history, and family history reviewed in  epic.  PHYSICAL EXAM: Vitals:   02/17/23 1512  BP: 101/89  Pulse: 97  SpO2: 99%   GENERAL: Well nourished and in no apparent distress at rest.  HEENT: The mucous membranes are pink and moist.   PULM:  Normal work of breathing, clear to auscultation bilaterally. Respirations are unlabored.  CARDIAC:  JVP: not elevated         Normal rate with regular rhythm. No murmurs, rubs or gallops.  No lower extremity edema.  ABDOMEN: Soft, non-tender, non-distended. NEUROLOGIC: Patient is oriented x3 with no focal or lateralizing neurologic deficits.  PSYCH: Patients affect is appropriate, there is no evidence of anxiety or depression.  SKIN: Warm and dry; no lesions or wounds. Warm and well perfused extremities.  DATA REVIEW  ECG: 12/24: Sinus tachycardia with PVCs 1/25: Sinus tachycardia with strain, minimal PVCs   ECHO: 10/24: LVEF 25 to 30%, global hypokinesis, mildly dilated.  Normal RV size and function.  Mild to moderate MR.   CATH: 12/08/2022: Left heart catheterization with no angiographically significant coronary disease.  Right heart cath: RA 10, PCWP 15, PA 27/11, 18, assumed Fick cardiac output 3.58/1.98  Heart failure review: - Classification: Heart failure with reduced EF - Etiology: Work up ongoing - NYHA Class: II - Volume status: Euvolemic - ACEi/ARB/ARNI: Currently up-titrating - Aldosterone antagonist: Maximally tolerated dose - Beta-blocker: Currently up-titrating - Digoxin : Maximally tolerated dose - Hydralazine /Nitrates: Not indicated - SGLT2i: Maximally tolerated dose - GLP-1: Not a candidate - Advanced therapies: Not needed at this time - ICD: Currently uptitrating GDMT  ASSESSMENT & PLAN:  Chronic Systolic Heart Failure:  Echo 10/24 EF 25-30%, RV nl, NICM. Etiology includes ETOH mediated (though not drinking heavily enough likely) +/- Hypertensive Heart Disease (BP markedly elevated on admit) +/- viral (recent proceeding URI symptoms prior to HF symptoms).  Hypovolemic symptoms and tachycardia in clinic today, but otherwise has been doing well. BP and tachycardia limits further titration, will plan to repeat echo at next visit. - NYHA Class II. Euvolemic on exam. Tachycardic. BP normotensive  - Continue Entersto to 49-51 mg bid. - Continue Jardiance  10 mg daily. No GU symptoms - Continue Spironolactone  25 mg daily - Continue coreg  6.25mg  BID - Continue digoxin  0.0625mg  daily, hopefully stop at next visit - Continue Lasix  20 mg PRN - discuss importance of avoiding pregnancy  - Repeat echocardiogram for next visit   Hypertension  - controlled on current regimen   ETOH Use Disorder  - suspect contributing to CM - she has cut back but still drinking a significant amount. Imperative that she continue reduce intake     Morene Brownie, MD Advanced Heart Failure Mechanical Circulatory Support 02/17/23

## 2023-02-18 ENCOUNTER — Encounter (HOSPITAL_COMMUNITY)
Admission: RE | Admit: 2023-02-18 | Discharge: 2023-02-18 | Disposition: A | Discharge status: Home / Self Care | Payer: 59 | Source: Ambulatory Visit | Attending: Cardiology | Admitting: Cardiology

## 2023-02-18 DIAGNOSIS — I5022 Chronic systolic (congestive) heart failure: Secondary | ICD-10-CM | POA: Diagnosis not present

## 2023-02-20 ENCOUNTER — Telehealth (HOSPITAL_COMMUNITY): Payer: Self-pay

## 2023-02-20 ENCOUNTER — Encounter (HOSPITAL_COMMUNITY): Payer: 59

## 2023-02-20 NOTE — Telephone Encounter (Signed)
 Called pt to inform of CRP2 closure due to inclement weather. LM on VM.  Jonna Coup, MS, ACSM-CEP 02/20/2023 8:28 AM

## 2023-02-21 ENCOUNTER — Other Ambulatory Visit (HOSPITAL_COMMUNITY): Payer: Self-pay | Admitting: Cardiology

## 2023-02-23 ENCOUNTER — Encounter (HOSPITAL_COMMUNITY)
Admission: RE | Admit: 2023-02-23 | Discharge: 2023-02-23 | Disposition: A | Discharge status: Home / Self Care | Payer: 59 | Source: Ambulatory Visit | Attending: Cardiology | Admitting: Cardiology

## 2023-02-23 DIAGNOSIS — I5022 Chronic systolic (congestive) heart failure: Secondary | ICD-10-CM

## 2023-02-25 ENCOUNTER — Encounter (HOSPITAL_COMMUNITY)
Admission: RE | Admit: 2023-02-25 | Discharge: 2023-02-25 | Disposition: A | Discharge status: Home / Self Care | Payer: 59 | Source: Ambulatory Visit | Attending: Cardiology | Admitting: Cardiology

## 2023-02-25 DIAGNOSIS — I5022 Chronic systolic (congestive) heart failure: Secondary | ICD-10-CM | POA: Diagnosis not present

## 2023-02-27 ENCOUNTER — Encounter (HOSPITAL_COMMUNITY)
Admission: RE | Admit: 2023-02-27 | Discharge: 2023-02-27 | Disposition: A | Discharge status: Home / Self Care | Payer: 59 | Source: Ambulatory Visit | Attending: Cardiology | Admitting: Cardiology

## 2023-02-27 DIAGNOSIS — I5022 Chronic systolic (congestive) heart failure: Secondary | ICD-10-CM | POA: Diagnosis not present

## 2023-03-02 ENCOUNTER — Encounter (HOSPITAL_COMMUNITY): Payer: 59

## 2023-03-04 ENCOUNTER — Encounter (HOSPITAL_COMMUNITY)
Admission: RE | Admit: 2023-03-04 | Discharge: 2023-03-04 | Disposition: A | Discharge status: Home / Self Care | Payer: 59 | Source: Ambulatory Visit | Attending: Cardiology | Admitting: Cardiology

## 2023-03-04 DIAGNOSIS — I5022 Chronic systolic (congestive) heart failure: Secondary | ICD-10-CM

## 2023-03-06 ENCOUNTER — Encounter (HOSPITAL_COMMUNITY): Payer: 59

## 2023-03-09 ENCOUNTER — Encounter (HOSPITAL_COMMUNITY)
Admission: RE | Admit: 2023-03-09 | Discharge: 2023-03-09 | Disposition: A | Discharge status: Home / Self Care | Payer: 59 | Source: Ambulatory Visit | Attending: Cardiology | Admitting: Cardiology

## 2023-03-09 DIAGNOSIS — I5022 Chronic systolic (congestive) heart failure: Secondary | ICD-10-CM

## 2023-03-11 ENCOUNTER — Encounter (HOSPITAL_COMMUNITY)
Admission: RE | Admit: 2023-03-11 | Discharge: 2023-03-11 | Disposition: A | Discharge status: Home / Self Care | Payer: 59 | Source: Ambulatory Visit | Attending: Cardiology | Admitting: Cardiology

## 2023-03-11 DIAGNOSIS — I5022 Chronic systolic (congestive) heart failure: Secondary | ICD-10-CM | POA: Diagnosis not present

## 2023-03-13 ENCOUNTER — Encounter (HOSPITAL_COMMUNITY)
Admission: RE | Admit: 2023-03-13 | Discharge: 2023-03-13 | Disposition: A | Discharge status: Home / Self Care | Payer: 59 | Source: Ambulatory Visit | Attending: Cardiology | Admitting: Cardiology

## 2023-03-13 ENCOUNTER — Encounter (HOSPITAL_COMMUNITY): Payer: 59

## 2023-03-13 DIAGNOSIS — I5022 Chronic systolic (congestive) heart failure: Secondary | ICD-10-CM

## 2023-03-16 ENCOUNTER — Encounter (HOSPITAL_COMMUNITY): Payer: 59

## 2023-03-16 NOTE — Progress Notes (Signed)
Cardiac Individual Treatment Plan  Patient Details  Name: Markiyah Gahm MRN: [id number] Date of Birth: 1975/09/23 Referring Provider:   Flowsheet Row INTENSIVE CARDIAC REHAB ORIENT from 01/26/2023 in [facility]  Referring Provider Clearnce Hasten, MD       Initial Encounter Date:  Flowsheet Row INTENSIVE CARDIAC REHAB ORIENT from 01/26/2023 in [facility]  Date 01/26/23       Visit Diagnosis: Heart failure, chronic systolic (HCC)  Patient's Home Medications on Admission:  Current Outpatient Medications:    albuterol (VENTOLIN HFA) 108 (90 Base) MCG/ACT inhaler, Inhale 2 puffs into the lungs every 4 (four) hours as needed., Disp: , Rfl:    amphetamine-dextroamphetamine (ADDERALL XR) 20 MG 24 hr capsule, Take 20 mg by mouth every morning., Disp: , Rfl:    amphetamine-dextroamphetamine (ADDERALL) 10 MG tablet, Take 10 mg by mouth daily. Patient takes on hectic days, when she goes to school and work, Disp: , Rfl:    carvedilol (COREG) 6.25 MG tablet, Take 1 tablet (6.25 mg total) by mouth 2 (two) times daily with a meal., Disp: 60 tablet, Rfl: 11   Cholecalciferol (VITAMIN D-3 PO), Take 1 capsule by mouth daily., Disp: , Rfl:    DHEA 25 MG CAPS, Take 1 capsule by mouth daily., Disp: , Rfl:    digoxin (LANOXIN) 0.125 MG tablet, TAKE 0.5 TABLETS (0.0625 MG TOTAL) BY MOUTH DAILY., Disp: 45 tablet, Rfl: 3   empagliflozin (JARDIANCE) 10 MG TABS tablet, Take 1 tablet (10 mg total) by mouth daily., Disp: 30 tablet, Rfl: 11   estrogens, conjugated, (PREMARIN) 1.25 MG tablet, Take 1.25 mg by mouth daily., Disp: , Rfl:    furosemide (LASIX) 20 MG tablet, TAKE 1 TABLET BY MOUTH DAILY AS NEEDED FOR FLUID OR EDEMA (WEIGHT GAIN OF 3LB IN 1 DAY OR 5LB IN 1 WEEK)., Disp: 90 tablet, Rfl: 1   hydrOXYzine (ATARAX) 25 MG tablet, Take 25-50 mg by mouth at bedtime., Disp: , Rfl:    loratadine  (CLARITIN) 10 MG tablet, Take 10 mg by mouth daily., Disp: , Rfl:    progesterone (PROMETRIUM) 100 MG capsule, Take 100 mg by mouth daily., Disp: , Rfl:    sacubitril-valsartan (ENTRESTO) 49-51 MG, Take 1 tablet by mouth 2 (two) times daily., Disp: 60 tablet, Rfl: 11   spironolactone (ALDACTONE) 25 MG tablet, Take 1 tablet (25 mg total) by mouth daily., Disp: 90 tablet, Rfl: 3   traZODone (DESYREL) 50 MG tablet, Take 50-100 mg by mouth at bedtime., Disp: , Rfl:   Past Medical History: Past Medical History:  Diagnosis Date   Anxiety    Medical history non-contributory     Tobacco Use: Social History   Tobacco Use  Smoking Status Former   Current packs/day: 0.00   Types: Cigarettes   Quit date: 04/06/2016   Years since quitting: 6.9  Smokeless Tobacco Never    Labs: Review Flowsheet       Latest Ref Rng & Units 12/07/2022 12/08/2022  Labs for ITP Cardiac and Pulmonary Rehab  Cholestrol 0 - 200 mg/dL - 409   LDL (calc) 0 - 99 mg/dL - 96   HDL-C >81 mg/dL - 69   Trlycerides <191 mg/dL - 478   Hemoglobin G9F 4.8 - 5.6 % 5.1  -  PH, Arterial 7.35 - 7.45 - 7.394   PCO2 arterial 32 - 48 mmHg - 37.7   Bicarbonate 20.0 -  28.0 mmol/L - 23.0  25.6   TCO2 22 - 32 mmol/L - 24  27   Acid-base deficit 0.0 - 2.0 mmol/L - 2.0   O2 Saturation % - 94  60     Details       Multiple values from one day are sorted in reverse-chronological order         Capillary Blood Glucose: No results found for: "GLUCAP"   Exercise Target Goals: Exercise Program Goal: Individual exercise prescription set using results from initial 6 min walk test and THRR while considering  patient's activity barriers and safety.   Exercise Prescription Goal: Initial exercise prescription builds to 30-45 minutes a day of aerobic activity, 2-3 days per week.  Home exercise guidelines will be given to patient during program as part of exercise prescription that the participant will acknowledge.  Activity  Barriers & Risk Stratification:  Activity Barriers & Cardiac Risk Stratification - 01/26/23 1359       Activity Barriers & Cardiac Risk Stratification   Activity Barriers Decreased Ventricular Function;Back Problems    Cardiac Risk Stratification High   <5 METs on            6 Minute Walk:  6 Minute Walk     Row Name 01/26/23 1527         6 Minute Walk   Phase Initial     Distance 1610 feet     Walk Time 6 minutes     # of Rest Breaks 0     MPH 3.05     METS 4.71     RPE 9     Perceived Dyspnea  0     VO2 Peak 16.5     Symptoms No     Resting HR 95 bpm     Resting BP 122/84     Resting Oxygen Saturation  95 %     Exercise Oxygen Saturation  during 6 min walk 95 %     Max Ex. HR 112 bpm     Max Ex. BP 126/72     2 Minute Post BP 122/80              Oxygen Initial Assessment:   Oxygen Re-Evaluation:   Oxygen Discharge (Final Oxygen Re-Evaluation):   Initial Exercise Prescription:  Initial Exercise Prescription - 01/26/23 1500       Date of Initial Exercise RX and Referring Provider   Date 01/26/23    Referring Provider Clearnce Hasten, MD    Expected Discharge Date 04/22/23      Treadmill   MPH 2.8    Grade 0    Minutes 15    METs 3.2      Elliptical   Level 1    Speed 1    Minutes 15    METs 3      Prescription Details   Frequency (times per week) 3    Duration Progress to 30 minutes of continuous aerobic without signs/symptoms of physical distress      Intensity   THRR 40-80% of Max Heartrate 69-139    Ratings of Perceived Exertion 11-13    Perceived Dyspnea 0-4      Progression   Progression Continue progressive overload as per policy without signs/symptoms or physical distress.      Resistance Training   Training Prescription Yes    Weight 3    Reps 10-15             Perform Capillary  Blood Glucose checks as needed.  Exercise Prescription Changes:   Exercise Prescription Changes     Row Name 02/09/23 1600  02/23/23 1630 03/09/23 1622         Response to Exercise   Blood Pressure (Admit) 118/70 120/82 100/72     Blood Pressure (Exercise) 134/78 120/66 118/78     Blood Pressure (Exit) 122/78 108/68 118/70     Heart Rate (Admit) 99 bpm 104 bpm 100 bpm     Heart Rate (Exercise) 144 bpm 130 bpm 149 bpm     Heart Rate (Exit) 104 bpm 104 bpm 109 bpm     Rating of Perceived Exertion (Exercise) 9 9 10.5     Perceived Dyspnea (Exercise) 0 0 0     Symptoms None None Elevated HR at entry, resolved to under 100BPM with rest     Comments Pt first day in CRP2 program Reviewed MET's and goals Reviewed MET's     Duration Continue with 30 min of aerobic exercise without signs/symptoms of physical distress. Continue with 30 min of aerobic exercise without signs/symptoms of physical distress. Continue with 30 min of aerobic exercise without signs/symptoms of physical distress.     Intensity THRR unchanged THRR unchanged THRR unchanged       Progression   Progression Continue to progress workloads to maintain intensity without signs/symptoms of physical distress. Continue to progress workloads to maintain intensity without signs/symptoms of physical distress. Continue to progress workloads to maintain intensity without signs/symptoms of physical distress.     Average METs 4.37 4.53 5.24       Resistance Training   Training Prescription Yes Yes Yes     Weight 3 3 5      Reps 10-15 10-15 10-15     Time -- 10 Minutes 10 Minutes       Treadmill   MPH 2.8 3.2 3.4     Grade 0 0 1     Minutes 15 15 15      METs 3.14 3.45 4.07       Elliptical   Level 1 2 2      Speed 1 1 3      Minutes 15 15 15      METs 5.6 5.6 6.4              Exercise Comments:   Exercise Comments     Row Name 02/09/23 1701 02/23/23 1635 03/09/23 1629       Exercise Comments Pt first day in CRP2 program. Pt tolerated exercise well with an avg MET level of 4.37 with no s/sx. Pt is learning her THRR, RPE scale, and ExRx. Pt did  great and is eager to progress workloads. reviewed MET's and goals. Pt tolerated exercise well with an avg MET level of 4.53. We are working together to increase WL's without exceeding THRR, she has been able to make gradual increases and is doing well. She feels good about her goals of feeling more confident with exercise. reviewed MET's today. Pt tolerated exercise well with an avg MET level of 5.24. Continuing to work together on gradual increased, but her heart is gaining tolerance to exercise. She feels good with her progress so far              Exercise Goals and Review:   Exercise Goals     Row Name 01/26/23 1359             Exercise Goals   Increase Physical Activity Yes  Intervention Provide advice, education, support and counseling about physical activity/exercise needs.;Develop an individualized exercise prescription for aerobic and resistive training based on initial evaluation findings, risk stratification, comorbidities and participant's personal goals.       Expected Outcomes Short Term: Attend rehab on a regular basis to increase amount of physical activity.;Long Term: Exercising regularly at least 3-5 days a week.;Long Term: Add in home exercise to make exercise part of routine and to increase amount of physical activity.       Increase Strength and Stamina Yes       Intervention Develop an individualized exercise prescription for aerobic and resistive training based on initial evaluation findings, risk stratification, comorbidities and participant's personal goals.;Provide advice, education, support and counseling about physical activity/exercise needs.       Expected Outcomes Short Term: Increase workloads from initial exercise prescription for resistance, speed, and METs.;Short Term: Perform resistance training exercises routinely during rehab and add in resistance training at home;Long Term: Improve cardiorespiratory fitness, muscular endurance and strength as  measured by increased METs and functional capacity ( )       Able to understand and use rate of perceived exertion (RPE) scale Yes       Intervention Provide education and explanation on how to use RPE scale       Expected Outcomes Short Term: Able to use RPE daily in rehab to express subjective intensity level;Long Term:  Able to use RPE to guide intensity level when exercising independently       Knowledge and understanding of Target Heart Rate Range (THRR) Yes       Intervention Provide education and explanation of THRR including how the numbers were predicted and where they are located for reference       Expected Outcomes Short Term: Able to state/look up THRR;Short Term: Able to use daily as guideline for intensity in rehab;Long Term: Able to use THRR to govern intensity when exercising independently       Understanding of Exercise Prescription Yes       Intervention Provide education, explanation, and written materials on patient's individual exercise prescription       Expected Outcomes Short Term: Able to explain program exercise prescription;Long Term: Able to explain home exercise prescription to exercise independently                Exercise Goals Re-Evaluation :  Exercise Goals Re-Evaluation     Row Name 02/09/23 1659 02/23/23 1632           Exercise Goal Re-Evaluation   Exercise Goals Review Increase Physical Activity;Understanding of Exercise Prescription;Increase Strength and Stamina;Knowledge and understanding of Target Heart Rate Range (THRR);Able to understand and use rate of perceived exertion (RPE) scale Increase Physical Activity;Understanding of Exercise Prescription;Increase Strength and Stamina;Knowledge and understanding of Target Heart Rate Range (THRR);Able to understand and use rate of perceived exertion (RPE) scale      Comments Pt first day in CRP2 program. Pt tolerated exercise well with an avg MET level of 4.37 with no s/sx. Pt is learning her THRR, RPE  scale, and ExRx. Pt did great and is eager to progress workloads. reviewed MET's and goals. Pt tolerated exercise well with an avg MET level of 4.53. We are working together to increase WL's without exceeding THRR, she has been able to make gradual increases and is doing well. She feels good about her goals of feeling more confident with exercise.      Expected Outcomes Will progress workloads as tolerated without  s/sx. Will continue to monitor pt and progress workloads as tolerated without sign or symptom               Discharge Exercise Prescription (Final Exercise Prescription Changes):  Exercise Prescription Changes - 03/09/23 1622       Response to Exercise   Blood Pressure (Admit) 100/72    Blood Pressure (Exercise) 118/78    Blood Pressure (Exit) 118/70    Heart Rate (Admit) 100 bpm    Heart Rate (Exercise) 149 bpm    Heart Rate (Exit) 109 bpm    Rating of Perceived Exertion (Exercise) 10.5    Perceived Dyspnea (Exercise) 0    Symptoms Elevated HR at entry, resolved to under 100BPM with rest    Comments Reviewed MET's    Duration Continue with 30 min of aerobic exercise without signs/symptoms of physical distress.    Intensity THRR unchanged      Progression   Progression Continue to progress workloads to maintain intensity without signs/symptoms of physical distress.    Average METs 5.24      Resistance Training   Training Prescription Yes    Weight 5    Reps 10-15    Time 10 Minutes      Treadmill   MPH 3.4    Grade 1    Minutes 15    METs 4.07      Elliptical   Level 2    Speed 3    Minutes 15    METs 6.4             Nutrition:  Target Goals: Understanding of nutrition guidelines, daily intake of sodium 1500mg , cholesterol 200mg , calories 30% from fat and 7% or less from saturated fats, daily to have 5 or more servings of fruits and vegetables.  Biometrics:  Pre Biometrics - 01/26/23 1529       Pre Biometrics   Waist Circumference 34 inches     Hip Circumference 43 inches    Waist to Hip Ratio 0.79 %    Triceps Skinfold 23 mm    % Body Fat 36.4 %    Grip Strength 24 kg    Flexibility 19.5 in    Single Leg Stand 30 seconds              Nutrition Therapy Plan and Nutrition Goals:   Nutrition Assessments:  MEDIFICTS Score Key: >=70 Need to make dietary changes  40-70 Heart Healthy Diet <= 40 Therapeutic Level Cholesterol Diet    Picture Your Plate Scores: <40 Unhealthy dietary pattern with much room for improvement. 41-50 Dietary pattern unlikely to meet recommendations for good health and room for improvement. 51-60 More healthful dietary pattern, with some room for improvement.  >60 Healthy dietary pattern, although there may be some specific behaviors that could be improved.    Nutrition Goals Re-Evaluation:   Nutrition Goals Re-Evaluation:   Nutrition Goals Discharge (Final Nutrition Goals Re-Evaluation):   Psychosocial: Target Goals: Acknowledge presence or absence of significant depression and/or stress, maximize coping skills, provide positive support system. Participant is able to verbalize types and ability to use techniques and skills needed for reducing stress and depression.  Initial Review & Psychosocial Screening:  Initial Psych Review & Screening - 01/26/23 1530       Initial Review   Current issues with Current Anxiety/Panic;Current Stress Concerns    Source of Stress Concerns Chronic Illness    Comments Javona shared that she occassional anxiety and some stress associated with her  recent diagnosis while balancing work and school. She states that her current medications are working and she has a Paramedic but isn't actively seeing them. Denies feelings of depression or need for additional resources.      Family Dynamics   Good Support System? Yes   Tamelia has her husband for support     Barriers   Psychosocial barriers to participate in program The patient should benefit from training in  stress management and relaxation.      Screening Interventions   Interventions To provide support and resources with identified psychosocial needs;Provide feedback about the scores to participant;Encouraged to exercise    Expected Outcomes Long Term goal: The participant improves quality of Life and PHQ9 Scores as seen by post scores and/or verbalization of changes;Short Term goal: Identification and review with participant of any Quality of Life or Depression concerns found by scoring the questionnaire.;Long Term Goal: Stressors or current issues are controlled or eliminated.;Short Term goal: Utilizing psychosocial counselor, staff and physician to assist with identification of specific Stressors or current issues interfering with healing process. Setting desired goal for each stressor or current issue identified.             Quality of Life Scores:  Quality of Life - 01/26/23 1422       Quality of Life   Select Quality of Life      Quality of Life Scores   Health/Function Pre 15.93 %    Socioeconomic Pre 22.36 %    Psych/Spiritual Pre 17.58 %    Family Pre 10.13 %    GLOBAL Pre 16.92 %            Scores of 19 and below usually indicate a poorer quality of life in these areas.  A difference of  2-3 points is a clinically meaningful difference.  A difference of 2-3 points in the total score of the Quality of Life Index has been associated with significant improvement in overall quality of life, self-image, physical symptoms, and general health in studies assessing change in quality of life.  PHQ-9: Review Flowsheet       01/26/2023  Depression screen PHQ 2/9  Decreased Interest 0  Down, Depressed, Hopeless 0  PHQ - 2 Score 0  Altered sleeping 0  Tired, decreased energy 0  Change in appetite 0  Feeling bad or failure about yourself  0  Trouble concentrating 0  Moving slowly or fidgety/restless 0  Suicidal thoughts 0  PHQ-9 Score 0   Interpretation of Total Score   Total Score Depression Severity:  1-4 = Minimal depression, 5-9 = Mild depression, 10-14 = Moderate depression, 15-19 = Moderately severe depression, 20-27 = Severe depression   Psychosocial Evaluation and Intervention:   Psychosocial Re-Evaluation:  Psychosocial Re-Evaluation     Row Name 02/12/23 0843 02/17/23 1247 03/16/23 1726         Psychosocial Re-Evaluation   Current issues with Current Stress Concerns;Current Anxiety/Panic Current Stress Concerns;Current Anxiety/Panic Current Stress Concerns;Current Anxiety/Panic     Comments Girtrude started cardiac rehab on 02/09/23. Theodora did not voice any increased concerns or stressors on her first day of exercise. Will review quality of life questionnaire in the upcoming week Reviewed quality of life questionaire on 02/16/23.Jakeline says she is dissatisfied with her health due to her CHF diagnosis. Omar says that her shortness of breath has improved some. Maritza denies being depressed currently. Kennita does have a therapist that she meets with twice a month. Angeleen denies being depressed currently.  Belen does have a therapist that she meets with twice a month. Jariana has not voiced any increased concerns or stressors during exercise at cardiac rehab.     Expected Outcomes Reve will have controlled or decreased stressors/ anxiety upon completion of cardiac rehab Terrica will have controlled or decreased stressors/ anxiety upon completion of cardiac rehab Jerome will have controlled or decreased stressors/ anxiety upon completion of cardiac rehab     Interventions Stress management education;Encouraged to attend Cardiac Rehabilitation for the exercise;Relaxation education Stress management education;Encouraged to attend Cardiac Rehabilitation for the exercise;Relaxation education Stress management education;Encouraged to attend Cardiac Rehabilitation for the exercise;Relaxation education     Continue Psychosocial Services  Follow up required by staff Follow up required by  staff No Follow up required       Initial Review   Source of Stress Concerns Chronic Illness Chronic Illness Chronic Illness     Comments Will continue to monitor and offer support as needed. Will continue to monitor and offer support as needed. Will continue to monitor and offer support as needed.              Psychosocial Discharge (Final Psychosocial Re-Evaluation):  Psychosocial Re-Evaluation - 03/16/23 1726       Psychosocial Re-Evaluation   Current issues with Current Stress Concerns;Current Anxiety/Panic    Comments Lativia denies being depressed currently. Roshonda does have a therapist that she meets with twice a month. Vi has not voiced any increased concerns or stressors during exercise at cardiac rehab.    Expected Outcomes Homer will have controlled or decreased stressors/ anxiety upon completion of cardiac rehab    Interventions Stress management education;Encouraged to attend Cardiac Rehabilitation for the exercise;Relaxation education    Continue Psychosocial Services  No Follow up required      Initial Review   Source of Stress Concerns Chronic Illness    Comments Will continue to monitor and offer support as needed.             Vocational Rehabilitation: Provide vocational rehab assistance to qualifying candidates.   Vocational Rehab Evaluation & Intervention:  Vocational Rehab - 01/26/23 1400       Initial Vocational Rehab Evaluation & Intervention   Assessment shows need for Vocational Rehabilitation No   Deniqua is working            Education: Education Goals: Education classes will be provided on a weekly basis, covering required topics. Participant will state understanding/return demonstration of topics presented.    Education     Row Name 02/09/23 1500     Education   Cardiac Education Topics Pritikin   Geographical information systems officer Psychosocial   Psychosocial Workshop Focused Goals, Sustainable  Changes   Instruction Review Code 1- Verbalizes Understanding   Class Start Time 1400   Class Stop Time 1440   Class Time Calculation (min) 40 min    Row Name 02/16/23 1400     Education   Cardiac Education Topics Pritikin   Select Core Videos     Core Videos   Educator Exercise Physiologist   Select Exercise Education   Exercise Education Biomechanial Limitations   Instruction Review Code 1- Verbalizes Understanding   Class Start Time 1400   Class Stop Time 1440   Class Time Calculation (min) 40 min    Row Name 02/18/23 1500     Education   Cardiac Education Topics Pritikin   [employer]  Secondary school teacher   Weekly Topic Comforting Weekend Breakfasts   Instruction Review Code 1- Verbalizes Understanding   Class Start Time 1400   Class Stop Time 1440   Class Time Calculation (min) 40 min    Row Name 02/23/23 1400     Education   Cardiac Education Topics Pritikin   Psychologist, forensic Exercise Education   Exercise Education Improving Performance   Instruction Review Code 1- Verbalizes Understanding   Class Start Time 1357   Class Stop Time 1439   Class Time Calculation (min) 42 min    Row Name 02/25/23 1600     Education   Cardiac Education Topics Pritikin   Customer service manager   Weekly Topic Fast Evening Meals   Instruction Review Code 1- Verbalizes Understanding   Class Start Time 1400   Class Stop Time 1441   Class Time Calculation (min) 41 min    Row Name 03/04/23 1500     Education   Cardiac Education Topics Pritikin   Customer service manager   Weekly Topic International Cuisine- Spotlight on the [employer] Zones   Instruction Review Code 1- Verbalizes Understanding   Class Start Time 1400   Class Stop Time 1441   Class Time Calculation (min) 41 min    Row Name 03/09/23 1500      Education   Cardiac Education Topics Pritikin   Select Workshops     Workshops   Educator Exercise Physiologist   Select Exercise   Exercise Workshop Managing Heart Disease: Your Path to a Healthier Heart   Instruction Review Code 1- Verbalizes Understanding   Class Start Time 1355   Class Stop Time 1445   Class Time Calculation (min) 50 min            Core Videos: Exercise    Move It!  Clinical staff conducted group or individual video education with verbal and written material and guidebook.  Patient learns the recommended Pritikin exercise program. Exercise with the goal of living a long, healthy life. Some of the health benefits of exercise include controlled diabetes, healthier blood pressure levels, improved cholesterol levels, improved heart and lung capacity, improved sleep, and better body composition. Everyone should speak with their doctor before starting or changing an exercise routine.  Biomechanical Limitations Clinical staff conducted group or individual video education with verbal and written material and guidebook.  Patient learns how biomechanical limitations can impact exercise and how we can mitigate and possibly overcome limitations to have an impactful and balanced exercise routine.  Body Composition Clinical staff conducted group or individual video education with verbal and written material and guidebook.  Patient learns that body composition (ratio of muscle mass to fat mass) is a key component to assessing overall fitness, rather than body weight alone. Increased fat mass, especially visceral belly fat, can put [place] at increased risk for metabolic syndrome, type 2 diabetes, heart disease, and even death. It is recommended to combine diet and exercise (cardiovascular and resistance training) to improve your body composition. Seek guidance from your physician and exercise physiologist before implementing an exercise routine.  Exercise Action Plan Clinical  staff conducted group or individual video education with verbal and written material and guidebook.  Patient learns the recommended strategies to achieve and enjoy long-term exercise  adherence, including variety, self-motivation, self-efficacy, and positive decision making. Benefits of exercise include fitness, good health, weight management, more energy, better sleep, less stress, and overall well-being.  Medical   Heart Disease Risk Reduction Clinical staff conducted group or individual video education with verbal and written material and guidebook.  Patient learns our heart is our most vital organ as it circulates oxygen, nutrients, white blood cells, and hormones throughout the entire body, and carries waste away. Data supports a plant-based eating plan like the Pritikin Program for its effectiveness in slowing progression of and reversing heart disease. The video provides a number of recommendations to address heart disease.   Metabolic Syndrome and Belly Fat  Clinical staff conducted group or individual video education with verbal and written material and guidebook.  Patient learns what metabolic syndrome is, how it leads to heart disease, and how one can reverse it and keep it from coming back. You have metabolic syndrome if you have 3 of the following 5 criteria: abdominal obesity, high blood pressure, high triglycerides, low HDL cholesterol, and high blood sugar.  Hypertension and Heart Disease Clinical staff conducted group or individual video education with verbal and written material and guidebook.  Patient learns that high blood pressure, or hypertension, is very common in the [place]. Hypertension is largely due to excessive salt intake, but other important risk factors include being overweight, physical inactivity, drinking too much alcohol, smoking, and not eating enough potassium from fruits and vegetables. High blood pressure is a leading risk factor for heart attack, stroke,  congestive heart failure, dementia, kidney failure, and premature death. Long-term effects of excessive salt intake include stiffening of the arteries and thickening of heart muscle and organ damage. Recommendations include ways to reduce hypertension and the risk of heart disease.  Diseases of Our Time - Focusing on Diabetes Clinical staff conducted group or individual video education with verbal and written material and guidebook.  Patient learns why the best way to stop diseases of our time is prevention, through food and other lifestyle changes. Medicine (such as prescription pills and surgeries) is often only a Band-Aid on the problem, not a long-term solution. Most common diseases of our time include obesity, type 2 diabetes, hypertension, heart disease, and cancer. The Pritikin Program is recommended and has been proven to help reduce, reverse, and/or prevent the damaging effects of metabolic syndrome.  Nutrition   Overview of the Pritikin Eating Plan  Clinical staff conducted group or individual video education with verbal and written material and guidebook.  Patient learns about the Pritikin Eating Plan for disease risk reduction. The Pritikin Eating Plan emphasizes a wide variety of unrefined, minimally-processed carbohydrates, like fruits, vegetables, whole grains, and legumes. Go, Caution, and Stop food choices are explained. Plant-based and lean animal proteins are emphasized. Rationale provided for low sodium intake for blood pressure control, low added sugars for blood sugar stabilization, and low added fats and oils for coronary artery disease risk reduction and weight management.  Calorie Density  Clinical staff conducted group or individual video education with verbal and written material and guidebook.  Patient learns about calorie density and how it impacts the Pritikin Eating Plan. Knowing the characteristics of the food you choose will help you decide whether those foods will lead  to weight gain or weight loss, and whether you want to consume more or less of them. Weight loss is usually a side effect of the Pritikin Eating Plan because of its focus on low calorie-dense foods.  Label Reading  Clinical staff conducted group or individual video education with verbal and written material and guidebook.  Patient learns about the Pritikin recommended label reading guidelines and corresponding recommendations regarding calorie density, added sugars, sodium content, and whole grains.  Dining Out - Part 1  Clinical staff conducted group or individual video education with verbal and written material and guidebook.  Patient learns that restaurant meals can be sabotaging because they can be so high in calories, fat, sodium, and/or sugar. Patient learns recommended strategies on how to positively address this and avoid unhealthy pitfalls.  Facts on Fats  Clinical staff conducted group or individual video education with verbal and written material and guidebook.  Patient learns that lifestyle modifications can be just as effective, if not more so, as many medications for lowering your risk of heart disease. A Pritikin lifestyle can help to reduce your risk of inflammation and atherosclerosis (cholesterol build-up, or plaque, in the artery walls). Lifestyle interventions such as dietary choices and physical activity address the cause of atherosclerosis. A review of the types of fats and their impact on blood cholesterol levels, along with dietary recommendations to reduce fat intake is also included.  Nutrition Action Plan  Clinical staff conducted group or individual video education with verbal and written material and guidebook.  Patient learns how to incorporate Pritikin recommendations into their lifestyle. Recommendations include planning and keeping personal health goals in mind as an important part of their success.  Healthy Mind-Set    Healthy Minds, Bodies, Hearts  Clinical  staff conducted group or individual video education with verbal and written material and guidebook.  Patient learns how to identify when they are stressed. Video will discuss the impact of that stress, as well as the many benefits of stress management. Patient will also be introduced to stress management techniques. The way we think, act, and feel has an impact on our hearts.  How Our Thoughts Can Heal Our Hearts  Clinical staff conducted group or individual video education with verbal and written material and guidebook.  Patient learns that negative thoughts can cause depression and anxiety. This can result in negative lifestyle behavior and serious health problems. Cognitive behavioral therapy is an effective method to help control our thoughts in order to change and improve our emotional outlook.  Additional Videos:  Exercise    Improving Performance  Clinical staff conducted group or individual video education with verbal and written material and guidebook.  Patient learns to use a non-linear approach by alternating intensity levels and lengths of time spent exercising to help burn more calories and lose more body fat. Cardiovascular exercise helps improve heart health, metabolism, hormonal balance, blood sugar control, and recovery from fatigue. Resistance training improves strength, endurance, balance, coordination, reaction time, metabolism, and muscle mass. Flexibility exercise improves circulation, posture, and balance. Seek guidance from your physician and exercise physiologist before implementing an exercise routine and learn your capabilities and proper form for all exercise.  Introduction to Yoga  Clinical staff conducted group or individual video education with verbal and written material and guidebook.  Patient learns about yoga, a discipline of the coming together of mind, breath, and body. The benefits of yoga include improved flexibility, improved range of motion, better posture and  core strength, increased lung function, weight loss, and positive self-image. Yoga's heart health benefits include lowered blood pressure, healthier heart rate, decreased cholesterol and triglyceride levels, improved immune function, and reduced stress. Seek guidance from your physician and exercise physiologist before implementing  an exercise routine and learn your capabilities and proper form for all exercise.  Medical   Aging: Enhancing Your Quality of Life  Clinical staff conducted group or individual video education with verbal and written material and guidebook.  Patient learns key strategies and recommendations to stay in good physical health and enhance quality of life, such as prevention strategies, having an advocate, securing a Health Care Proxy and Power of Attorney, and keeping a list of medications and system for tracking them. It also discusses how to avoid risk for bone loss.  Biology of Weight Control  Clinical staff conducted group or individual video education with verbal and written material and guidebook.  Patient learns that weight gain occurs because we consume more calories than we burn (eating more, moving less). Even if your body weight is normal, you may have higher ratios of fat compared to muscle mass. Too much body fat puts you at increased risk for cardiovascular disease, heart attack, stroke, type 2 diabetes, and obesity-related cancers. In addition to exercise, following the Pritikin Eating Plan can help reduce your risk.  Decoding Lab Results  Clinical staff conducted group or individual video education with verbal and written material and guidebook.  Patient learns that lab test reflects one measurement whose values change over time and are influenced by many factors, including medication, stress, sleep, exercise, food, hydration, pre-existing medical conditions, and more. It is recommended to use the knowledge from this video to become more involved with your lab  results and evaluate your numbers to speak with your doctor.   Diseases of Our Time - Overview  Clinical staff conducted group or individual video education with verbal and written material and guidebook.  Patient learns that according to the CDC, 50% to 70% of chronic diseases (such as obesity, type 2 diabetes, elevated lipids, hypertension, and heart disease) are avoidable through lifestyle improvements including healthier food choices, listening to satiety cues, and increased physical activity.  Sleep Disorders Clinical staff conducted group or individual video education with verbal and written material and guidebook.  Patient learns how good quality and duration of sleep are important to overall health and well-being. Patient also learns about sleep disorders and how they impact health along with recommendations to address them, including discussing with a physician.  Nutrition  Dining Out - Part 2 Clinical staff conducted group or individual video education with verbal and written material and guidebook.  Patient learns how to plan ahead and communicate in order to maximize their dining experience in a healthy and nutritious manner. Included are recommended food choices based on the type of restaurant the patient is visiting.   Fueling a Banker conducted group or individual video education with verbal and written material and guidebook.  There is a strong connection between our food choices and our health. Diseases like obesity and type 2 diabetes are very prevalent and are in large-part due to lifestyle choices. The Pritikin Eating Plan provides plenty of food and hunger-curbing satisfaction. It is easy to follow, affordable, and helps reduce health risks.  Menu Workshop  Clinical staff conducted group or individual video education with verbal and written material and guidebook.  Patient learns that restaurant meals can sabotage health goals because they are often  packed with calories, fat, sodium, and sugar. Recommendations include strategies to plan ahead and to communicate with the manager, chef, or server to help order a healthier meal.  Planning Your Eating Strategy  Clinical staff conducted group or individual  video education with verbal and written material and guidebook.  Patient learns about the Pritikin Eating Plan and its benefit of reducing the risk of disease. The Pritikin Eating Plan does not focus on calories. Instead, it emphasizes high-quality, nutrient-rich foods. By knowing the characteristics of the foods, we choose, we can determine their calorie density and make informed decisions.  Targeting Your Nutrition Priorities  Clinical staff conducted group or individual video education with verbal and written material and guidebook.  Patient learns that lifestyle habits have a tremendous impact on disease risk and progression. This video provides eating and physical activity recommendations based on your personal health goals, such as reducing LDL cholesterol, losing weight, preventing or controlling type 2 diabetes, and reducing high blood pressure.  Vitamins and Minerals  Clinical staff conducted group or individual video education with verbal and written material and guidebook.  Patient learns different ways to obtain key vitamins and minerals, including through a recommended healthy diet. It is important to discuss all supplements you take with your doctor.   Healthy Mind-Set    Smoking Cessation  Clinical staff conducted group or individual video education with verbal and written material and guidebook.  Patient learns that cigarette smoking and tobacco addiction pose a serious health risk which affects millions of people. Stopping smoking will significantly reduce the risk of heart disease, lung disease, and many forms of cancer. Recommended strategies for quitting are covered, including working with your doctor to develop a successful  plan.  Culinary   Becoming a Set designer conducted group or individual video education with verbal and written material and guidebook.  Patient learns that cooking at home can be healthy, cost-effective, quick, and puts them in control. Keys to cooking healthy recipes will include looking at your recipe, assessing your equipment needs, planning ahead, making it simple, choosing cost-effective seasonal ingredients, and limiting the use of added fats, salts, and sugars.  Cooking - Breakfast and Snacks  Clinical staff conducted group or individual video education with verbal and written material and guidebook.  Patient learns how important breakfast is to satiety and nutrition through the entire day. Recommendations include key foods to eat during breakfast to help stabilize blood sugar levels and to prevent overeating at meals later in the day. Planning ahead is also a key component.  Cooking - Educational psychologist conducted group or individual video education with verbal and written material and guidebook.  Patient learns eating strategies to improve overall health, including an approach to cook more at home. Recommendations include thinking of animal protein as a side on your plate rather than center stage and focusing instead on lower calorie dense options like vegetables, fruits, whole grains, and plant-based proteins, such as beans. Making sauces in large quantities to freeze for later and leaving the skin on your vegetables are also recommended to maximize your experience.  Cooking - Healthy Salads and Dressing Clinical staff conducted group or individual video education with verbal and written material and guidebook.  Patient learns that vegetables, fruits, whole grains, and legumes are the foundations of the Pritikin Eating Plan. Recommendations include how to incorporate each of these in flavorful and healthy salads, and how to create homemade salad dressings.  Proper handling of ingredients is also covered. Cooking - Soups and [employer] - Soups and Desserts Clinical staff conducted group or individual video education with verbal and written material and guidebook.  Patient learns that Pritikin soups and desserts make for easy,  nutritious, and delicious snacks and meal components that are low in sodium, fat, sugar, and calorie density, while high in vitamins, minerals, and filling fiber. Recommendations include simple and healthy ideas for soups and desserts.   Overview     The Pritikin Solution Program Overview Clinical staff conducted group or individual video education with verbal and written material and guidebook.  Patient learns that the results of the Pritikin Program have been documented in more than 100 articles published in peer-reviewed journals, and the benefits include reducing risk factors for (and, in some cases, even reversing) high cholesterol, high blood pressure, type 2 diabetes, obesity, and more! An overview of the three key pillars of the Pritikin Program will be covered: eating well, doing regular exercise, and having a healthy mind-set.  WORKSHOPS  Exercise: Exercise Basics: Building Your Action Plan Clinical staff led group instruction and group discussion with PowerPoint presentation and patient guidebook. To enhance the learning environment the use of posters, models and videos may be added. At the conclusion of this workshop, patients will comprehend the difference between physical activity and exercise, as well as the benefits of incorporating both, into their routine. Patients will understand the FITT (Frequency, Intensity, Time, and Type) principle and how to use it to build an exercise action plan. In addition, safety concerns and other considerations for exercise and cardiac rehab will be addressed by the presenter. The purpose of this lesson is to promote a comprehensive and effective weekly exercise routine in  order to improve patients' overall level of fitness.   Managing Heart Disease: Your Path to a Healthier Heart Clinical staff led group instruction and group discussion with PowerPoint presentation and patient guidebook. To enhance the learning environment the use of posters, models and videos may be added.At the conclusion of this workshop, patients will understand the anatomy and physiology of the heart. Additionally, they will understand how Pritikin's three pillars impact the risk factors, the progression, and the management of heart disease.  The purpose of this lesson is to provide a high-level overview of the heart, heart disease, and how the Pritikin lifestyle positively impacts risk factors.  Exercise Biomechanics Clinical staff led group instruction and group discussion with PowerPoint presentation and patient guidebook. To enhance the learning environment the use of posters, models and videos may be added. Patients will learn how the structural parts of their bodies function and how these functions impact their daily activities, movement, and exercise. Patients will learn how to promote a neutral spine, learn how to manage pain, and identify ways to improve their physical movement in order to promote healthy living. The purpose of this lesson is to expose patients to common physical limitations that impact physical activity. Participants will learn practical ways to adapt and manage aches and pains, and to minimize their effect on regular exercise. Patients will learn how to maintain good posture while sitting, walking, and lifting.  Balance Training and Fall Prevention  Clinical staff led group instruction and group discussion with PowerPoint presentation and patient guidebook. To enhance the learning environment the use of posters, models and videos may be added. At the conclusion of this workshop, patients will understand the importance of their sensorimotor skills (vision,  proprioception, and the vestibular system) in maintaining their ability to balance as they age. Patients will apply a variety of balancing exercises that are appropriate for their current level of function. Patients will understand the common causes for poor balance, possible solutions to these problems, and ways to modify their  physical environment in order to minimize their fall risk. The purpose of this lesson is to teach patients about the importance of maintaining balance as they age and ways to minimize their risk of falling.  WORKSHOPS   Nutrition:  Fueling a Ship broker led group instruction and group discussion with PowerPoint presentation and patient guidebook. To enhance the learning environment the use of posters, models and videos may be added. Patients will review the foundational principles of the Pritikin Eating Plan and understand what constitutes a serving size in each of the food groups. Patients will also learn Pritikin-friendly foods that are better choices when away from home and review make-ahead meal and snack options. Calorie density will be reviewed and applied to three nutrition priorities: weight maintenance, weight loss, and weight gain. The purpose of this lesson is to reinforce (in a group setting) the key concepts around what patients are recommended to eat and how to apply these guidelines when away from home by planning and selecting Pritikin-friendly options. Patients will understand how calorie density may be adjusted for different weight management goals.  Mindful Eating  Clinical staff led group instruction and group discussion with PowerPoint presentation and patient guidebook. To enhance the learning environment the use of posters, models and videos may be added. Patients will briefly review the concepts of the Pritikin Eating Plan and the importance of low-calorie dense foods. The concept of mindful eating will be introduced as well as the  importance of paying attention to internal hunger signals. Triggers for non-hunger eating and techniques for dealing with triggers will be explored. The purpose of this lesson is to provide patients with the opportunity to review the basic principles of the Pritikin Eating Plan, discuss the value of eating mindfully and how to measure internal cues of hunger and fullness using the Hunger Scale. Patients will also discuss reasons for non-hunger eating and learn strategies to use for controlling emotional eating.  Targeting Your Nutrition Priorities Clinical staff led group instruction and group discussion with PowerPoint presentation and patient guidebook. To enhance the learning environment the use of posters, models and videos may be added. Patients will learn how to determine their genetic susceptibility to disease by reviewing their family history. Patients will gain insight into the importance of diet as part of an overall healthy lifestyle in mitigating the impact of genetics and other environmental insults. The purpose of this lesson is to provide patients with the opportunity to assess their personal nutrition priorities by looking at their family history, their own health history and current risk factors. Patients will also be able to discuss ways of prioritizing and modifying the Pritikin Eating Plan for their highest risk areas  Menu  Clinical staff led group instruction and group discussion with PowerPoint presentation and patient guidebook. To enhance the learning environment the use of posters, models and videos may be added. Using menus brought in from [employer], or printed from [employer], patients will apply the Pritikin dining out guidelines that were presented in the [employer] video. Patients will also be able to practice these guidelines in a variety of provided scenarios. The purpose of this lesson is to provide patients with the opportunity to practice  hands-on learning of the Pritikin Dining Out guidelines with actual menus and practice scenarios.  Label Reading Clinical staff led group instruction and group discussion with PowerPoint presentation and patient guidebook. To enhance the learning environment the use of posters, models and videos may be added.  Patients will review and discuss the Pritikin label reading guidelines presented in Pritikin's Label Reading Educational series video. Using fool labels brought in from local grocery stores and markets, patients will apply the label reading guidelines and determine if the packaged food meet the Pritikin guidelines. The purpose of this lesson is to provide patients with the opportunity to review, discuss, and practice hands-on learning of the Pritikin Label Reading guidelines with actual packaged food labels. Cooking School  Pritikin's [employer] are designed to teach patients ways to prepare quick, simple, and affordable recipes at home. The importance of nutrition's role in chronic disease risk reduction is reflected in its emphasis in the overall Pritikin program. By learning how to prepare essential core Pritikin Eating Plan recipes, patients will increase control over what they eat; be able to customize the flavor of foods without the use of added salt, sugar, or fat; and improve the quality of the food they consume. By learning a set of core recipes which are easily assembled, quickly prepared, and affordable, patients are more likely to prepare more healthy foods at home. These workshops focus on convenient breakfasts, simple entres, side dishes, and desserts which can be prepared with minimal effort and are consistent with nutrition recommendations for cardiovascular risk reduction. Cooking [employer] are taught by a Armed forces logistics/support/administrative officer (RD) who has been trained by the [employer]. The chef or RD has a clear understanding of the importance of minimizing -  if not completely eliminating - added fat, sugar, and sodium in recipes. Throughout the series of Cooking School Workshop sessions, patients will learn about healthy ingredients and efficient methods of cooking to build confidence in their capability to prepare    Cooking School weekly topics:  Adding Flavor- Sodium-Free  Fast and Healthy Breakfasts  Powerhouse Plant-Based Proteins  Satisfying Salads and Dressings  Simple Sides and Sauces  International Cuisine-Spotlight on the [employer] Zones  Delicious Desserts  Savory Soups  [employer] - Meals in a Astronomer Appetizers and Snacks  Comforting Weekend Breakfasts  One-Pot Wonders   Fast Evening Meals  Landscape architect Your Pritikin Plate  WORKSHOPS   Healthy Mindset (Psychosocial):  Focused Goals, Sustainable Changes Clinical staff led group instruction and group discussion with PowerPoint presentation and patient guidebook. To enhance the learning environment the use of posters, models and videos may be added. Patients will be able to apply effective goal setting strategies to establish at least one personal goal, and then take consistent, meaningful action toward that goal. They will learn to identify common barriers to achieving personal goals and develop strategies to overcome them. Patients will also gain an understanding of how our mind-set can impact our ability to achieve goals and the importance of cultivating a positive and growth-oriented mind-set. The purpose of this lesson is to provide patients with a deeper understanding of how to set and achieve personal goals, as well as the tools and strategies needed to overcome common obstacles which may arise along the way.  From Head to Heart: The Power of a Healthy Outlook  Clinical staff led group instruction and group discussion with PowerPoint presentation and patient guidebook. To enhance the learning environment the use of posters, models and videos may be  added. Patients will be able to recognize and describe the impact of emotions and mood on physical health. They will discover the importance of self-care and explore self-care practices which may work for them. Patients will also learn how  to utilize the 4 C's to cultivate a healthier outlook and better manage stress and challenges. The purpose of this lesson is to demonstrate to patients how a healthy outlook is an essential part of maintaining good health, especially as they continue their cardiac rehab journey.  Healthy Sleep for a Healthy Heart Clinical staff led group instruction and group discussion with PowerPoint presentation and patient guidebook. To enhance the learning environment the use of posters, models and videos may be added. At the conclusion of this workshop, patients will be able to demonstrate knowledge of the importance of sleep to overall health, well-being, and quality of life. They will understand the symptoms of, and treatments for, common sleep disorders. Patients will also be able to identify daytime and nighttime behaviors which impact sleep, and they will be able to apply these tools to help manage sleep-related challenges. The purpose of this lesson is to provide patients with a general overview of sleep and outline the importance of quality sleep. Patients will learn about a few of the most common sleep disorders. Patients will also be introduced to the concept of "sleep hygiene," and discover ways to self-manage certain sleeping problems through simple daily behavior changes. Finally, the workshop will motivate patients by clarifying the links between quality sleep and their goals of heart-healthy living.   Recognizing and Reducing Stress Clinical staff led group instruction and group discussion with PowerPoint presentation and patient guidebook. To enhance the learning environment the use of posters, models and videos may be added. At the conclusion of this workshop, patients  will be able to understand the types of stress reactions, differentiate between acute and chronic stress, and recognize the impact that chronic stress has on their health. They will also be able to apply different coping mechanisms, such as reframing negative self-talk. Patients will have the opportunity to practice a variety of stress management techniques, such as deep abdominal breathing, progressive muscle relaxation, and/or guided imagery.  The purpose of this lesson is to educate patients on the role of stress in their lives and to provide healthy techniques for coping with it.  Learning Barriers/Preferences:  Learning Barriers/Preferences - 01/26/23 1400       Learning Barriers/Preferences   Learning Barriers Sight   glasses   Learning Preferences Audio;Computer/Internet;Group Instruction;Individual Instruction;Pictoral;Skilled Demonstration;Verbal Instruction;Video;Written Material             Education Topics:  Knowledge Questionnaire Score:  Knowledge Questionnaire Score - 01/26/23 1417       Knowledge Questionnaire Score   Pre Score 23/24             Core Components/Risk Factors/Patient Goals at Admission:  Personal Goals and Risk Factors at Admission - 01/26/23 1400       Core Components/Risk Factors/Patient Goals on Admission    Weight Management Yes;Weight Loss   pt goal   Intervention Weight Management: Develop a combined nutrition and exercise program designed to reach desired caloric intake, while maintaining appropriate intake of nutrient and fiber, sodium and fats, and appropriate energy expenditure required for the weight goal.;Weight Management: Provide education and appropriate resources to help participant work on and attain dietary goals.    Expected Outcomes Short Term: Continue to assess and modify interventions until short term weight is achieved;Long Term: Adherence to nutrition and physical activity/exercise program aimed toward attainment of  established weight goal;Weight Loss: Understanding of general recommendations for a balanced deficit meal plan, which promotes 1-2 lb weight loss per week and includes a negative energy balance  of [phone] kcal/d;Understanding recommendations for meals to include 15-35% energy as protein, 25-35% energy from fat, 35-60% energy from carbohydrates, less than 200mg  of dietary cholesterol, 20-35 gm of total fiber daily;Understanding of distribution of calorie intake throughout the day with the consumption of 4-5 meals/snacks    Heart Failure Yes    Intervention Provide a combined exercise and nutrition program that is supplemented with education, support and counseling about heart failure. Directed toward relieving symptoms such as shortness of breath, decreased exercise tolerance, and extremity edema.    Expected Outcomes Improve functional capacity of life;Short term: Attendance in program 2-3 days a week with increased exercise capacity. Reported lower sodium intake. Reported increased fruit and vegetable intake. Reports medication compliance.;Short term: Daily weights obtained and reported for increase. Utilizing diuretic protocols set by physician.;Long term: Adoption of self-care skills and reduction of barriers for early signs and symptoms recognition and intervention leading to self-care maintenance.    Hypertension Yes    Intervention Provide education on lifestyle modifcations including regular physical activity/exercise, weight management, moderate sodium restriction and increased consumption of fresh fruit, vegetables, and low fat dairy, alcohol moderation, and smoking cessation.;Monitor prescription use compliance.    Expected Outcomes Short Term: Continued assessment and intervention until BP is < 140/58mm HG in hypertensive participants. < 130/74mm HG in hypertensive participants with diabetes, heart failure or chronic kidney disease.;Long Term: Maintenance of blood pressure at goal levels.     Stress Yes    Intervention Offer individual and/or small group education and counseling on adjustment to heart disease, stress management and health-related lifestyle change. Teach and support self-help strategies.;Refer participants experiencing significant psychosocial distress to appropriate mental health specialists for further evaluation and treatment. When possible, include family members and significant others in education/counseling sessions.    Expected Outcomes Short Term: Participant demonstrates changes in health-related behavior, relaxation and other stress management skills, ability to obtain effective social support, and compliance with psychotropic medications if prescribed.;Long Term: Emotional wellbeing is indicated by absence of clinically significant psychosocial distress or social isolation.             Core Components/Risk Factors/Patient Goals Review:   Goals and Risk Factor Review     Row Name 02/12/23 0849 02/17/23 1249 03/16/23 1727         Core Components/Risk Factors/Patient Goals Review   Personal Goals Review Weight Management/Obesity;Heart Failure;Hypertension;Stress Weight Management/Obesity;Heart Failure;Hypertension;Stress Weight Management/Obesity;Heart Failure;Hypertension;Stress     Review Keely started cardiac rehab on 02/09/23. Katoya did very  well with exercise. Vital signs were stable. Wilhelmine is off to a good start to exercise.. Vital signs have been stable. Santosha has been increasing her met levels. Lorra is doing well with exercise at cardiac rehab. Vital signs have been stable. Sheniya has been increasing her met levels.     Expected Outcomes Keiko will continue to participate in cardiac rehab for exercise, nutrition and lifestyle modifications Ioanna will continue to participate in cardiac rehab for exercise, nutrition and lifestyle modifications Mckennah will continue to participate in cardiac rehab for exercise, nutrition and lifestyle modifications               Core Components/Risk Factors/Patient Goals at Discharge (Final Review):   Goals and Risk Factor Review - 03/16/23 1727       Core Components/Risk Factors/Patient Goals Review   Personal Goals Review Weight Management/Obesity;Heart Failure;Hypertension;Stress    Review Lanika is doing well with exercise at cardiac rehab. Vital signs have been stable. Haneen has been increasing her met  levels.    Expected Outcomes Shaely will continue to participate in cardiac rehab for exercise, nutrition and lifestyle modifications             ITP Comments:  ITP Comments     Row Name 01/26/23 1346 02/12/23 0827 02/17/23 1245 03/16/23 1725     ITP Comments Dr. Armanda Magic medical director. Introduction to pritikin education/ intensive cardiac rehab. Initial orientation packet reviewed with patient. 30 Day ITP Review. Christyana started cardiac rehab on 02/09/23. Levit did very well with exercise. 30 Day ITP Review. Dellie started cardiac rehab on 02/09/23. Altice is off to a good start with exercise. 30 Day ITP Review. Ravina has good attendance and particpation in  cardiac rehab .             Comments: See ITP comments.Thayer Headings RN BSN

## 2023-03-18 ENCOUNTER — Encounter (HOSPITAL_COMMUNITY)
Admission: RE | Admit: 2023-03-18 | Discharge: 2023-03-18 | Disposition: A | Discharge status: Home / Self Care | Payer: 59 | Source: Ambulatory Visit | Attending: Cardiology | Admitting: Cardiology

## 2023-03-18 DIAGNOSIS — I5022 Chronic systolic (congestive) heart failure: Secondary | ICD-10-CM

## 2023-03-20 ENCOUNTER — Encounter (HOSPITAL_COMMUNITY)
Admission: RE | Admit: 2023-03-20 | Discharge: 2023-03-20 | Disposition: A | Discharge status: Home / Self Care | Payer: 59 | Source: Ambulatory Visit | Attending: Cardiology | Admitting: Cardiology

## 2023-03-20 DIAGNOSIS — I5022 Chronic systolic (congestive) heart failure: Secondary | ICD-10-CM

## 2023-03-23 ENCOUNTER — Encounter (HOSPITAL_COMMUNITY)
Admission: RE | Admit: 2023-03-23 | Discharge: 2023-03-23 | Disposition: A | Discharge status: Home / Self Care | Payer: 59 | Source: Ambulatory Visit | Attending: Cardiology | Admitting: Cardiology

## 2023-03-23 DIAGNOSIS — I5022 Chronic systolic (congestive) heart failure: Secondary | ICD-10-CM | POA: Diagnosis not present

## 2023-03-24 ENCOUNTER — Other Ambulatory Visit (HOSPITAL_COMMUNITY): Payer: Self-pay | Admitting: Cardiology

## 2023-03-25 ENCOUNTER — Encounter (HOSPITAL_COMMUNITY): Payer: 59

## 2023-03-25 ENCOUNTER — Telehealth (HOSPITAL_COMMUNITY): Payer: Self-pay | Admitting: *Deleted

## 2023-03-25 NOTE — Telephone Encounter (Signed)
Message left on departmental voicemail for cardiac rehab.  Pt will be out today.  No reason given.  Pt would like to visit earlier class at 12:30 on Friday. Alanson Aly, BSN Cardiac and Emergency planning/management officer

## 2023-03-27 ENCOUNTER — Encounter (HOSPITAL_COMMUNITY)
Admission: RE | Admit: 2023-03-27 | Discharge: 2023-03-27 | Disposition: A | Discharge status: Home / Self Care | Payer: 59 | Source: Ambulatory Visit | Attending: Cardiology | Admitting: Cardiology

## 2023-03-27 DIAGNOSIS — I5022 Chronic systolic (congestive) heart failure: Secondary | ICD-10-CM | POA: Diagnosis not present

## 2023-03-30 ENCOUNTER — Encounter (HOSPITAL_COMMUNITY): Payer: 59

## 2023-04-01 ENCOUNTER — Encounter (HOSPITAL_COMMUNITY): Payer: 59

## 2023-04-03 ENCOUNTER — Encounter (HOSPITAL_COMMUNITY)
Admission: RE | Admit: 2023-04-03 | Discharge: 2023-04-03 | Disposition: A | Discharge status: Home / Self Care | Payer: 59 | Source: Ambulatory Visit | Attending: Cardiology | Admitting: Cardiology

## 2023-04-03 DIAGNOSIS — I5022 Chronic systolic (congestive) heart failure: Secondary | ICD-10-CM | POA: Diagnosis not present

## 2023-04-06 ENCOUNTER — Encounter (HOSPITAL_COMMUNITY): Payer: 59

## 2023-04-08 ENCOUNTER — Encounter (HOSPITAL_COMMUNITY)
Admission: RE | Admit: 2023-04-08 | Discharge: 2023-04-08 | Disposition: A | Discharge status: Home / Self Care | Payer: 59 | Source: Ambulatory Visit | Attending: Cardiology | Admitting: Cardiology

## 2023-04-08 DIAGNOSIS — I5022 Chronic systolic (congestive) heart failure: Secondary | ICD-10-CM

## 2023-04-10 ENCOUNTER — Telehealth (HOSPITAL_COMMUNITY): Payer: Self-pay | Admitting: *Deleted

## 2023-04-10 ENCOUNTER — Encounter (HOSPITAL_COMMUNITY): Payer: 59

## 2023-04-10 NOTE — Telephone Encounter (Signed)
 Message left on departmental voicemail.  Pt not feeling well.   Did not elaborate on symptoms. Alanson Aly, BSN Cardiac and Emergency planning/management officer

## 2023-04-13 ENCOUNTER — Encounter (HOSPITAL_COMMUNITY)
Admission: RE | Admit: 2023-04-13 | Discharge: 2023-04-13 | Disposition: A | Discharge status: Home / Self Care | Payer: 59 | Source: Ambulatory Visit | Attending: Cardiology | Admitting: Cardiology

## 2023-04-13 DIAGNOSIS — I5022 Chronic systolic (congestive) heart failure: Secondary | ICD-10-CM | POA: Diagnosis present

## 2023-04-14 NOTE — Progress Notes (Signed)
 Cardiac Individual Treatment Plan  Patient Details  Name: Hannah Bullock MRN: [id number] Date of Birth: 07/11/1975 Referring Provider:   Flowsheet Row INTENSIVE CARDIAC REHAB ORIENT from 01/26/2023 in [facility]  Referring Provider Clearnce Hasten, MD       Initial Encounter Date:  Flowsheet Row INTENSIVE CARDIAC REHAB ORIENT from 01/26/2023 in [facility]  Date 01/26/23       Visit Diagnosis: Heart failure, chronic systolic (HCC)  Patient's Home Medications on Admission:  Current Outpatient Medications:    albuterol (VENTOLIN HFA) 108 (90 Base) MCG/ACT inhaler, Inhale 2 puffs into the lungs every 4 (four) hours as needed., Disp: , Rfl:    amphetamine-dextroamphetamine (ADDERALL XR) 20 MG 24 hr capsule, Take 20 mg by mouth every morning., Disp: , Rfl:    amphetamine-dextroamphetamine (ADDERALL) 10 MG tablet, Take 10 mg by mouth daily. Patient takes on hectic days, when she goes to school and work, Disp: , Rfl:    carvedilol (COREG) 6.25 MG tablet, Take 1 tablet (6.25 mg total) by mouth 2 (two) times daily with a meal., Disp: 60 tablet, Rfl: 11   Cholecalciferol (VITAMIN D-3 PO), Take 1 capsule by mouth daily., Disp: , Rfl:    DHEA 25 MG CAPS, Take 1 capsule by mouth daily., Disp: , Rfl:    digoxin (LANOXIN) 0.125 MG tablet, TAKE 0.5 TABLETS (0.0625 MG TOTAL) BY MOUTH DAILY., Disp: 45 tablet, Rfl: 3   empagliflozin (JARDIANCE) 10 MG TABS tablet, Take 1 tablet (10 mg total) by mouth daily., Disp: 30 tablet, Rfl: 11   estrogens, conjugated, (PREMARIN) 1.25 MG tablet, Take 1.25 mg by mouth daily., Disp: , Rfl:    furosemide (LASIX) 20 MG tablet, TAKE 1 TABLET BY MOUTH DAILY AS NEEDED FOR FLUID OR EDEMA (WEIGHT GAIN OF 3LB IN 1 DAY OR 5LB IN 1 WEEK)., Disp: 90 tablet, Rfl: 1   hydrOXYzine (ATARAX) 25 MG tablet, Take 25-50 mg by mouth at bedtime., Disp: , Rfl:    loratadine  (CLARITIN) 10 MG tablet, Take 10 mg by mouth daily., Disp: , Rfl:    progesterone (PROMETRIUM) 100 MG capsule, Take 100 mg by mouth daily., Disp: , Rfl:    sacubitril-valsartan (ENTRESTO) 49-51 MG, Take 1 tablet by mouth 2 (two) times daily., Disp: 60 tablet, Rfl: 11   spironolactone (ALDACTONE) 25 MG tablet, Take 1 tablet (25 mg total) by mouth daily., Disp: 90 tablet, Rfl: 3   traZODone (DESYREL) 50 MG tablet, Take 50-100 mg by mouth at bedtime., Disp: , Rfl:   Past Medical History: Past Medical History:  Diagnosis Date   Anxiety    Medical history non-contributory     Tobacco Use: Social History   Tobacco Use  Smoking Status Former   Current packs/day: 0.00   Types: Cigarettes   Quit date: 04/06/2016   Years since quitting: 7.0  Smokeless Tobacco Never    Labs: Review Flowsheet       Latest Ref Rng & Units 12/07/2022 12/08/2022  Labs for ITP Cardiac and Pulmonary Rehab  Cholestrol 0 - 200 mg/dL - 409   LDL (calc) 0 - 99 mg/dL - 96   HDL-C >81 mg/dL - 69   Trlycerides <191 mg/dL - 478   Hemoglobin G9F 4.8 - 5.6 % 5.1  -  PH, Arterial 7.35 - 7.45 - 7.394   PCO2 arterial 32 - 48 mmHg - 37.7   Bicarbonate 20.0 -  28.0 mmol/L - 23.0  25.6   TCO2 22 - 32 mmol/L - 24  27   Acid-base deficit 0.0 - 2.0 mmol/L - 2.0   O2 Saturation % - 94  60     Details       Multiple values from one day are sorted in reverse-chronological order         Capillary Blood Glucose: No results found for: "GLUCAP"   Exercise Target Goals: Exercise Program Goal: Individual exercise prescription set using results from initial 6 min walk test and THRR while considering  patient's activity barriers and safety.   Exercise Prescription Goal: Initial exercise prescription builds to 30-45 minutes a day of aerobic activity, 2-3 days per week.  Home exercise guidelines will be given to patient during program as part of exercise prescription that the participant will acknowledge.  Activity  Barriers & Risk Stratification:  Activity Barriers & Cardiac Risk Stratification - 01/26/23 1359       Activity Barriers & Cardiac Risk Stratification   Activity Barriers Decreased Ventricular Function;Back Problems    Cardiac Risk Stratification High   <5 METs on            6 Minute Walk:  6 Minute Walk     Row Name 01/26/23 1527         6 Minute Walk   Phase Initial     Distance 1610 feet     Walk Time 6 minutes     # of Rest Breaks 0     MPH 3.05     METS 4.71     RPE 9     Perceived Dyspnea  0     VO2 Peak 16.5     Symptoms No     Resting HR 95 bpm     Resting BP 122/84     Resting Oxygen Saturation  95 %     Exercise Oxygen Saturation  during 6 min walk 95 %     Max Ex. HR 112 bpm     Max Ex. BP 126/72     2 Minute Post BP 122/80              Oxygen Initial Assessment:   Oxygen Re-Evaluation:   Oxygen Discharge (Final Oxygen Re-Evaluation):   Initial Exercise Prescription:  Initial Exercise Prescription - 01/26/23 1500       Date of Initial Exercise RX and Referring Provider   Date 01/26/23    Referring Provider Clearnce Hasten, MD    Expected Discharge Date 04/22/23      Treadmill   MPH 2.8    Grade 0    Minutes 15    METs 3.2      Elliptical   Level 1    Speed 1    Minutes 15    METs 3      Prescription Details   Frequency (times per week) 3    Duration Progress to 30 minutes of continuous aerobic without signs/symptoms of physical distress      Intensity   THRR 40-80% of Max Heartrate 69-139    Ratings of Perceived Exertion 11-13    Perceived Dyspnea 0-4      Progression   Progression Continue progressive overload as per policy without signs/symptoms or physical distress.      Resistance Training   Training Prescription Yes    Weight 3    Reps 10-15             Perform Capillary  Blood Glucose checks as needed.  Exercise Prescription Changes:   Exercise Prescription Changes     Row Name 02/09/23 1600  02/23/23 1630 03/09/23 1622 03/23/23 1627 04/08/23 1600     Response to Exercise   Blood Pressure (Admit) 118/70 120/82 100/72 112/72 128/72   Blood Pressure (Exercise) 134/78 120/66 118/78 -- --   Blood Pressure (Exit) 122/78 108/68 118/70 120/70 106/70   Heart Rate (Admit) 99 bpm 104 bpm 100 bpm 95 bpm 96 bpm   Heart Rate (Exercise) 144 bpm 130 bpm 149 bpm 137 bpm 133 bpm   Heart Rate (Exit) 104 bpm 104 bpm 109 bpm 101 bpm 104 bpm   Rating of Perceived Exertion (Exercise) 9 9 10.5 8 8.5   Perceived Dyspnea (Exercise) 0 0 0 0 --   Symptoms None None Elevated HR at entry, resolved to under 100BPM with rest -- none   Comments Pt first day in CRP2 program Reviewed MET's and goals Reviewed MET's Reviewed MET's, goals and home ExRx REVD MET's   Duration Continue with 30 min of aerobic exercise without signs/symptoms of physical distress. Continue with 30 min of aerobic exercise without signs/symptoms of physical distress. Continue with 30 min of aerobic exercise without signs/symptoms of physical distress. Continue with 30 min of aerobic exercise without signs/symptoms of physical distress. Continue with 30 min of aerobic exercise without signs/symptoms of physical distress.   Intensity THRR unchanged THRR unchanged THRR unchanged THRR unchanged THRR unchanged     Progression   Progression Continue to progress workloads to maintain intensity without signs/symptoms of physical distress. Continue to progress workloads to maintain intensity without signs/symptoms of physical distress. Continue to progress workloads to maintain intensity without signs/symptoms of physical distress. Continue to progress workloads to maintain intensity without signs/symptoms of physical distress. Continue to progress workloads to maintain intensity without signs/symptoms of physical distress.   Average METs 4.37 4.53 5.24 4.68 5.28     Resistance Training   Training Prescription Yes Yes Yes Yes No   Weight 3 3 5 7  --    Reps 10-15 10-15 10-15 10-15 --   Time -- 10 Minutes 10 Minutes 10 Minutes --     Treadmill   MPH 2.8 3.2 3.4 3.2 3.2   Grade 0 0 1 0 0   Minutes 15 15 15 15 15    METs 3.14 3.45 4.07 3.45 3.45     Elliptical   Level 1 2 2 2 2    Speed 1 1 3 3 3    Minutes 15 15 15 15 15    METs 5.6 5.6 6.4 5.9 7.1     Home Exercise Plan   Plans to continue exercise at -- -- -- [employer] (comment) [employer] (comment)   Frequency -- -- -- Add 2 additional days to program exercise sessions. Add 2 additional days to program exercise sessions.   Initial Home Exercises Provided -- -- -- 03/23/23 03/23/23            Exercise Comments:   Exercise Comments     Row Name 02/09/23 1701 02/23/23 1635 03/09/23 1629 03/23/23 1642 04/08/23 1626   Exercise Comments Pt first day in CRP2 program. Pt tolerated exercise well with an avg MET level of 4.37 with no s/sx. Pt is learning her THRR, RPE scale, and ExRx. Pt did great and is eager to progress workloads. reviewed MET's and goals. Pt tolerated exercise well with an avg MET level of 4.53. We are working together to increase WL's without exceeding  THRR, she has been able to make gradual increases and is doing well. She feels good about her goals of feeling more confident with exercise. reviewed MET's today. Pt tolerated exercise well with an avg MET level of 5.24. Continuing to work together on gradual increased, but her heart is gaining tolerance to exercise. She feels good with her progress so far Reviewed MET's, home ExRx and goals. Pt tolerated exercise well with an avg MET level of 4.68. We will work toward gradual increases as HR allows. She is doing well and talked over home exercise in great detail and weight lifting progression. She will go to PF, weights walking and gym equipment for 1-2 day for 30-45 mins per session Reviewed MET's today. Pt tolerated exercise well with an avg MET level of 5.28. were working together to track THRR for gradual  increases. She's feeling good with exercise            Exercise Goals and Review:   Exercise Goals     Row Name 01/26/23 1359             Exercise Goals   Increase Physical Activity Yes       Intervention Provide advice, education, support and counseling about physical activity/exercise needs.;Develop an individualized exercise prescription for aerobic and resistive training based on initial evaluation findings, risk stratification, comorbidities and participant's personal goals.       Expected Outcomes Short Term: Attend rehab on a regular basis to increase amount of physical activity.;Long Term: Exercising regularly at least 3-5 days a week.;Long Term: Add in home exercise to make exercise part of routine and to increase amount of physical activity.       Increase Strength and Stamina Yes       Intervention Develop an individualized exercise prescription for aerobic and resistive training based on initial evaluation findings, risk stratification, comorbidities and participant's personal goals.;Provide advice, education, support and counseling about physical activity/exercise needs.       Expected Outcomes Short Term: Increase workloads from initial exercise prescription for resistance, speed, and METs.;Short Term: Perform resistance training exercises routinely during rehab and add in resistance training at home;Long Term: Improve cardiorespiratory fitness, muscular endurance and strength as measured by increased METs and functional capacity ( )       Able to understand and use rate of perceived exertion (RPE) scale Yes       Intervention Provide education and explanation on how to use RPE scale       Expected Outcomes Short Term: Able to use RPE daily in rehab to express subjective intensity level;Long Term:  Able to use RPE to guide intensity level when exercising independently       Knowledge and understanding of Target Heart Rate Range (THRR) Yes       Intervention Provide  education and explanation of THRR including how the numbers were predicted and where they are located for reference       Expected Outcomes Short Term: Able to state/look up THRR;Short Term: Able to use daily as guideline for intensity in rehab;Long Term: Able to use THRR to govern intensity when exercising independently       Understanding of Exercise Prescription Yes       Intervention Provide education, explanation, and written materials on patient's individual exercise prescription       Expected Outcomes Short Term: Able to explain program exercise prescription;Long Term: Able to explain home exercise prescription to exercise independently  Exercise Goals Re-Evaluation :  Exercise Goals Re-Evaluation     Row Name 02/09/23 1659 02/23/23 1632 03/23/23 1637         Exercise Goal Re-Evaluation   Exercise Goals Review Increase Physical Activity;Understanding of Exercise Prescription;Increase Strength and Stamina;Knowledge and understanding of Target Heart Rate Range (THRR);Able to understand and use rate of perceived exertion (RPE) scale Increase Physical Activity;Understanding of Exercise Prescription;Increase Strength and Stamina;Knowledge and understanding of Target Heart Rate Range (THRR);Able to understand and use rate of perceived exertion (RPE) scale Increase Physical Activity;Understanding of Exercise Prescription;Increase Strength and Stamina;Knowledge and understanding of Target Heart Rate Range (THRR);Able to understand and use rate of perceived exertion (RPE) scale     Comments Pt first day in CRP2 program. Pt tolerated exercise well with an avg MET level of 4.37 with no s/sx. Pt is learning her THRR, RPE scale, and ExRx. Pt did great and is eager to progress workloads. reviewed MET's and goals. Pt tolerated exercise well with an avg MET level of 4.53. We are working together to increase WL's without exceeding THRR, she has been able to make gradual increases and is  doing well. She feels good about her goals of feeling more confident with exercise. Reviewed MET's, home ExRx and goals. Pt tolerated exercise well with an avg MET level of 4.68. We will work toward gradual increases as HR allows. She is doing well and talked over home exercise in great detail and weight lifting progression. She will go to PF, weights walking and gym equipment for 1-2 day for 30-45 mins per session     Expected Outcomes Will progress workloads as tolerated without s/sx. Will continue to monitor pt and progress workloads as tolerated without sign or symptom Will continue to monitor pt and progress workloads as tolerated without sign or symptom              Discharge Exercise Prescription (Final Exercise Prescription Changes):  Exercise Prescription Changes - 04/08/23 1600       Response to Exercise   Blood Pressure (Admit) 128/72    Blood Pressure (Exit) 106/70    Heart Rate (Admit) 96 bpm    Heart Rate (Exercise) 133 bpm    Heart Rate (Exit) 104 bpm    Rating of Perceived Exertion (Exercise) 8.5    Symptoms none    Comments REVD MET's    Duration Continue with 30 min of aerobic exercise without signs/symptoms of physical distress.    Intensity THRR unchanged      Progression   Progression Continue to progress workloads to maintain intensity without signs/symptoms of physical distress.    Average METs 5.28      Resistance Training   Training Prescription No      Treadmill   MPH 3.2    Grade 0    Minutes 15    METs 3.45      Elliptical   Level 2    Speed 3    Minutes 15    METs 7.1      Home Exercise Plan   Plans to continue exercise at [facility] (comment)    Frequency Add 2 additional days to program exercise sessions.    Initial Home Exercises Provided 03/23/23             Nutrition:  Target Goals: Understanding of nutrition guidelines, daily intake of sodium 1500mg , cholesterol 200mg , calories 30% from fat and 7% or less from  saturated fats, daily to have 5 or more servings of  fruits and vegetables.  Biometrics:  Pre Biometrics - 01/26/23 1529       Pre Biometrics   Waist Circumference 34 inches    Hip Circumference 43 inches    Waist to Hip Ratio 0.79 %    Triceps Skinfold 23 mm    % Body Fat 36.4 %    Grip Strength 24 kg    Flexibility 19.5 in    Single Leg Stand 30 seconds              Nutrition Therapy Plan and Nutrition Goals:  Nutrition Therapy & Goals - 04/14/23 1128       Personal Nutrition Goals   Nutrition Goal Patient to identify strategies for reducing cardiovascular risk by attending the [facility] education and nutrition series weekly.   goal in progress.   Personal Goal #2 Patient to improve diet quality by using the plate method as a guide for meal planning to include lean protein/plant protein, fruits, vegetables, whole grains, nonfat dairy as part of a well-balanced diet.   goal in progress.   Comments Goals in progress. Hannah Bullock has medical history of Congestive Systolic Heart Failure, HTN, ETOH use disorder. HTN remains well controlled per cardiology notes 02/17/23. She continues to work on reducing alcohol intake. She has maintained her weight since starting with our program. Lipids WNL. She continues to attend the [facility] education and nutrition series regularly. Patient will continue to benefit from participation in intensive cardiac rehab for nutrition, exercise, and lifestyle modification.      Intervention Plan   Intervention Prescribe, educate and counsel regarding individualized specific dietary modifications aiming towards targeted core components such as weight, hypertension, lipid management, diabetes, heart failure and other comorbidities.;Nutrition handout(s) given to patient.    Expected Outcomes Short Term Goal: Understand basic principles of dietary content, such as calories, fat, sodium, cholesterol and nutrients.;Long Term Goal: Adherence to prescribed nutrition plan.              Nutrition Assessments:  Nutrition Assessments - 03/26/23 0911       Rate Your Plate Scores   Pre Score 54            MEDIFICTS Score Key: >=70 Need to make dietary changes  40-70 Heart Healthy Diet <= 40 Therapeutic Level Cholesterol Diet   Flowsheet Row INTENSIVE CARDIAC REHAB from 03/23/2023 in [facility]  Picture Your Plate Total Score on Admission 54      Picture Your Plate Scores: <16 Unhealthy dietary pattern with much room for improvement. 41-50 Dietary pattern unlikely to meet recommendations for good health and room for improvement. 51-60 More healthful dietary pattern, with some room for improvement.  >60 Healthy dietary pattern, although there may be some specific behaviors that could be improved.    Nutrition Goals Re-Evaluation:  Nutrition Goals Re-Evaluation     Row Name 04/14/23 1128             Goals   Current Weight 168 lb 3.4 oz (76.3 kg)       Comment Lpa 125, A1c WNL, LDL 96, Cr 1.14       Expected Outcome Goals in progress. Satin has medical history of Congestive Systolic Heart Failure, HTN, ETOH use disorder. HTN remains well controlled per cardiology notes 02/17/23. She continues to work on reducing alcohol intake. She has maintained her weight since starting with our program. Lipids WNL. She continues to attend the [facility] education and nutrition series regularly.  Patient will continue to benefit from participation in intensive cardiac rehab for nutrition, exercise, and lifestyle modification.                Nutrition Goals Re-Evaluation:  Nutrition Goals Re-Evaluation     Row Name 04/14/23 1128             Goals   Current Weight 168 lb 3.4 oz (76.3 kg)       Comment Lpa 125, A1c WNL, LDL 96, Cr 1.14       Expected Outcome Goals in progress. Hannah Bullock has medical history of Congestive Systolic Heart Failure, HTN, ETOH use disorder. HTN remains well controlled per  cardiology notes 02/17/23. She continues to work on reducing alcohol intake. She has maintained her weight since starting with our program. Lipids WNL. She continues to attend the [facility] education and nutrition series regularly. Patient will continue to benefit from participation in intensive cardiac rehab for nutrition, exercise, and lifestyle modification.                Nutrition Goals Discharge (Final Nutrition Goals Re-Evaluation):  Nutrition Goals Re-Evaluation - 04/14/23 1128       Goals   Current Weight 168 lb 3.4 oz (76.3 kg)    Comment Lpa 125, A1c WNL, LDL 96, Cr 1.14    Expected Outcome Goals in progress. Hannah Bullock has medical history of Congestive Systolic Heart Failure, HTN, ETOH use disorder. HTN remains well controlled per cardiology notes 02/17/23. She continues to work on reducing alcohol intake. She has maintained her weight since starting with our program. Lipids WNL. She continues to attend the [facility] education and nutrition series regularly. Patient will continue to benefit from participation in intensive cardiac rehab for nutrition, exercise, and lifestyle modification.             Psychosocial: Target Goals: Acknowledge presence or absence of significant depression and/or stress, maximize coping skills, provide positive support system. Participant is able to verbalize types and ability to use techniques and skills needed for reducing stress and depression.  Initial Review & Psychosocial Screening:  Initial Psych Review & Screening - 01/26/23 1530       Initial Review   Current issues with Current Anxiety/Panic;Current Stress Concerns    Source of Stress Concerns Chronic Illness    Comments Francia shared that she occassional anxiety and some stress associated with her recent diagnosis while balancing work and school. She states that her current medications are working and she has a Paramedic but isn't actively seeing them. Denies feelings of depression or need for  additional resources.      Family Dynamics   Good Support System? Yes   Hannah Bullock has her husband for support     Barriers   Psychosocial barriers to participate in program The patient should benefit from training in stress management and relaxation.      Screening Interventions   Interventions To provide support and resources with identified psychosocial needs;Provide feedback about the scores to participant;Encouraged to exercise    Expected Outcomes Long Term goal: The participant improves quality of Life and PHQ9 Scores as seen by post scores and/or verbalization of changes;Short Term goal: Identification and review with participant of any Quality of Life or Depression concerns found by scoring the questionnaire.;Long Term Goal: Stressors or current issues are controlled or eliminated.;Short Term goal: Utilizing psychosocial counselor, staff and physician to assist with identification of specific Stressors or current issues interfering with healing process. Setting desired goal for each stressor or  current issue identified.             Quality of Life Scores:  Quality of Life - 01/26/23 1422       Quality of Life   Select Quality of Life      Quality of Life Scores   Health/Function Pre 15.93 %    Socioeconomic Pre 22.36 %    Psych/Spiritual Pre 17.58 %    Family Pre 10.13 %    GLOBAL Pre 16.92 %            Scores of 19 and below usually indicate a poorer quality of life in these areas.  A difference of  2-3 points is a clinically meaningful difference.  A difference of 2-3 points in the total score of the Quality of Life Index has been associated with significant improvement in overall quality of life, self-image, physical symptoms, and general health in studies assessing change in quality of life.  PHQ-9: Review Flowsheet       01/26/2023  Depression screen PHQ 2/9  Decreased Interest 0  Down, Depressed, Hopeless 0  PHQ - 2 Score 0  Altered sleeping 0  Tired,  decreased energy 0  Change in appetite 0  Feeling bad or failure about yourself  0  Trouble concentrating 0  Moving slowly or fidgety/restless 0  Suicidal thoughts 0  PHQ-9 Score 0   Interpretation of Total Score  Total Score Depression Severity:  1-4 = Minimal depression, 5-9 = Mild depression, 10-14 = Moderate depression, 15-19 = Moderately severe depression, 20-27 = Severe depression   Psychosocial Evaluation and Intervention:   Psychosocial Re-Evaluation:  Psychosocial Re-Evaluation     Row Name 02/12/23 0843 02/17/23 1247 03/16/23 1726 04/14/23 1503       Psychosocial Re-Evaluation   Current issues with Current Stress Concerns;Current Anxiety/Panic Current Stress Concerns;Current Anxiety/Panic Current Stress Concerns;Current Anxiety/Panic Current Stress Concerns;Current Anxiety/Panic    Comments Hannah Bullock started cardiac rehab on 02/09/23. Hannah Bullock did not voice any increased concerns or stressors on her first day of exercise. Will review quality of life questionnaire in the upcoming week Reviewed quality of life questionaire on 02/16/23.Marcelene says she is dissatisfied with her health due to her CHF diagnosis. Lilianna says that her shortness of breath has improved some. Hannah Bullock denies being depressed currently. Hannah Bullock does have a therapist that she meets with twice a month. Hannah Bullock denies being depressed currently. Hannah Bullock does have a therapist that she meets with twice a month. Hannah Bullock has not voiced any increased concerns or stressors during exercise at cardiac rehab. Hannah Bullock has not voiced any increased concerns or stressors during exercise at cardiac rehab.    Expected Outcomes Hannah Bullock will have controlled or decreased stressors/ anxiety upon completion of cardiac rehab Thy will have controlled or decreased stressors/ anxiety upon completion of cardiac rehab Hannah Bullock will have controlled or decreased stressors/ anxiety upon completion of cardiac rehab Hannah Bullock will have controlled or decreased stressors/ anxiety upon  completion of cardiac rehab    Interventions Stress management education;Encouraged to attend Cardiac Rehabilitation for the exercise;Relaxation education Stress management education;Encouraged to attend Cardiac Rehabilitation for the exercise;Relaxation education Stress management education;Encouraged to attend Cardiac Rehabilitation for the exercise;Relaxation education Stress management education;Encouraged to attend Cardiac Rehabilitation for the exercise;Relaxation education    Continue Psychosocial Services  Follow up required by staff Follow up required by staff No Follow up required No Follow up required      Initial Review   Source of Stress Concerns Chronic Illness Chronic Illness Chronic  Illness Chronic Illness    Comments Will continue to monitor and offer support as needed. Will continue to monitor and offer support as needed. Will continue to monitor and offer support as needed. Will continue to monitor and offer support as needed.             Psychosocial Discharge (Final Psychosocial Re-Evaluation):  Psychosocial Re-Evaluation - 04/14/23 1503       Psychosocial Re-Evaluation   Current issues with Current Stress Concerns;Current Anxiety/Panic    Comments Rital has not voiced any increased concerns or stressors during exercise at cardiac rehab.    Expected Outcomes Anuradha will have controlled or decreased stressors/ anxiety upon completion of cardiac rehab    Interventions Stress management education;Encouraged to attend Cardiac Rehabilitation for the exercise;Relaxation education    Continue Psychosocial Services  No Follow up required      Initial Review   Source of Stress Concerns Chronic Illness    Comments Will continue to monitor and offer support as needed.             Vocational Rehabilitation: Provide vocational rehab assistance to qualifying candidates.   Vocational Rehab Evaluation & Intervention:  Vocational Rehab - 01/26/23 1400       Initial  Vocational Rehab Evaluation & Intervention   Assessment shows need for Vocational Rehabilitation No   Hannah Bullock is working            Education: Education Goals: Education classes will be provided on a weekly basis, covering required topics. Participant will state understanding/return demonstration of topics presented.    Education     Row Name 02/09/23 1500     Education   Cardiac Education Topics Pritikin   Geographical information systems officer Psychosocial   Psychosocial Workshop Focused Goals, Sustainable Changes   Instruction Review Code 1- Verbalizes Understanding   Class Start Time 1400   Class Stop Time 1440   Class Time Calculation (min) 40 min    Row Name 02/16/23 1400     Education   Cardiac Education Topics Pritikin   Psychologist, forensic Exercise Education   Exercise Education Biomechanial Limitations   Instruction Review Code 1- Verbalizes Understanding   Class Start Time 1400   Class Stop Time 1440   Class Time Calculation (min) 40 min    Row Name 02/18/23 1500     Education   Cardiac Education Topics Pritikin   Customer service manager   Weekly Topic Comforting Weekend Breakfasts   Instruction Review Code 1- Verbalizes Understanding   Class Start Time 1400   Class Stop Time 1440   Class Time Calculation (min) 40 min    Row Name 02/23/23 1400     Education   Cardiac Education Topics Pritikin   Psychologist, forensic Exercise Education   Exercise Education Improving Performance   Instruction Review Code 1- Verbalizes Understanding   Class Start Time 1357   Class Stop Time 1439   Class Time Calculation (min) 42 min    Row Name 02/25/23 1600     Education   Cardiac Education Topics Pritikin   Optometrist   Weekly Topic Fast [employer]  Instruction Review Code 1- Verbalizes Understanding   Class Start Time 1400   Class Stop Time 1441   Class Time Calculation (min) 41 min    Row Name 03/04/23 1500     Education   Cardiac Education Topics Pritikin   Customer service manager   Weekly Topic International Cuisine- Spotlight on the [employer] Zones   Instruction Review Code 1- Verbalizes Understanding   Class Start Time 1400   Class Stop Time 1441   Class Time Calculation (min) 41 min    Row Name 03/09/23 1500     Education   Cardiac Education Topics Pritikin   [employer] Workshops     Workshops   Educator Exercise Physiologist   [employer] Exercise   Exercise Workshop Managing Heart Disease: Your Path to a Healthier Heart   Instruction Review Code 1- Verbalizes Understanding   Class Start Time 1355   Class Stop Time 1445   Class Time Calculation (min) 50 min    Row Name 03/18/23 1400     Education   Cardiac Education Topics Pritikin   Secondary school teacher School   Educator Nurse;Respiratory Therapist   Weekly Topic Powerhouse Plant-Based Proteins   Instruction Review Code 1- Verbalizes Understanding   Class Start Time 1358   Class Stop Time 1430   Class Time Calculation (min) 32 min    Row Name 03/23/23 1400     Education   [employer] Workshops     Workshops   Biomedical scientist Exercise   Exercise Workshop Location manager and Fall Prevention   Instruction Review Code 1- Verbalizes Understanding   Class Start Time 1400   Class Stop Time 1445   Class Time Calculation (min) 45 min    Row Name 04/08/23 1500     Education   Cardiac Education Topics Pritikin   Orthoptist   Educator Dietitian;Nurse   Weekly Topic Personalizing Your Pritikin Plate   Instruction Review Code 1- Verbalizes Understanding   Class Start Time 1400   Class Stop Time 1439   Class  Time Calculation (min) 39 min    Row Name 04/13/23 1500     Education   Cardiac Education Topics Pritikin   [employer] Workshops     Workshops   Educator Exercise Physiologist   [employer] Psychosocial   Psychosocial Workshop Recognizing and Reducing Stress   Instruction Review Code 1- Verbalizes Understanding   Class Start Time 1358   Class Stop Time 1445   Class Time Calculation (min) 47 min            Core Videos: Exercise    Move It!  Clinical staff conducted group or individual video education with verbal and written material and guidebook.  Patient learns the recommended Pritikin exercise program. Exercise with the goal of living a long, healthy life. Some of the health benefits of exercise include controlled diabetes, healthier blood pressure levels, improved cholesterol levels, improved heart and lung capacity, improved sleep, and better body composition. Everyone should speak with their doctor before starting or changing an exercise routine.  Biomechanical Limitations Clinical staff conducted group or individual video education with verbal and written material and guidebook.  Patient learns how biomechanical limitations can impact exercise and how we can mitigate and possibly overcome limitations to have an impactful and balanced exercise routine.  Body Composition Clinical staff conducted group or individual  video education with verbal and written material and guidebook.  Patient learns that body composition (ratio of muscle mass to fat mass) is a key component to assessing overall fitness, rather than body weight alone. Increased fat mass, especially visceral belly fat, can put [place] at increased risk for metabolic syndrome, type 2 diabetes, heart disease, and even death. It is recommended to combine diet and exercise (cardiovascular and resistance training) to improve your body composition. Seek guidance from your physician and exercise physiologist before implementing an exercise  routine.  Exercise Action Plan Clinical staff conducted group or individual video education with verbal and written material and guidebook.  Patient learns the recommended strategies to achieve and enjoy long-term exercise adherence, including variety, self-motivation, self-efficacy, and positive decision making. Benefits of exercise include fitness, good health, weight management, more energy, better sleep, less stress, and overall well-being.  Medical   Heart Disease Risk Reduction Clinical staff conducted group or individual video education with verbal and written material and guidebook.  Patient learns our heart is our most vital organ as it circulates oxygen, nutrients, white blood cells, and hormones throughout the entire body, and carries waste away. Data supports a plant-based eating plan like the Pritikin Program for its effectiveness in slowing progression of and reversing heart disease. The video provides a number of recommendations to address heart disease.   Metabolic Syndrome and Belly Fat  Clinical staff conducted group or individual video education with verbal and written material and guidebook.  Patient learns what metabolic syndrome is, how it leads to heart disease, and how one can reverse it and keep it from coming back. You have metabolic syndrome if you have 3 of the following 5 criteria: abdominal obesity, high blood pressure, high triglycerides, low HDL cholesterol, and high blood sugar.  Hypertension and Heart Disease Clinical staff conducted group or individual video education with verbal and written material and guidebook.  Patient learns that high blood pressure, or hypertension, is very common in the [place]. Hypertension is largely due to excessive salt intake, but other important risk factors include being overweight, physical inactivity, drinking too much alcohol, smoking, and not eating enough potassium from fruits and vegetables. High blood pressure is a  leading risk factor for heart attack, stroke, congestive heart failure, dementia, kidney failure, and premature death. Long-term effects of excessive salt intake include stiffening of the arteries and thickening of heart muscle and organ damage. Recommendations include ways to reduce hypertension and the risk of heart disease.  Diseases of Our Time - Focusing on Diabetes Clinical staff conducted group or individual video education with verbal and written material and guidebook.  Patient learns why the best way to stop diseases of our time is prevention, through food and other lifestyle changes. Medicine (such as prescription pills and surgeries) is often only a Band-Aid on the problem, not a long-term solution. Most common diseases of our time include obesity, type 2 diabetes, hypertension, heart disease, and cancer. The Pritikin Program is recommended and has been proven to help reduce, reverse, and/or prevent the damaging effects of metabolic syndrome.  Nutrition   Overview of the Pritikin Eating Plan  Clinical staff conducted group or individual video education with verbal and written material and guidebook.  Patient learns about the Pritikin Eating Plan for disease risk reduction. The Pritikin Eating Plan emphasizes a wide variety of unrefined, minimally-processed carbohydrates, like fruits, vegetables, whole grains, and legumes. Go, Caution, and Stop food choices are explained. Plant-based and lean animal proteins are emphasized.  Rationale provided for low sodium intake for blood pressure control, low added sugars for blood sugar stabilization, and low added fats and oils for coronary artery disease risk reduction and weight management.  Calorie Density  Clinical staff conducted group or individual video education with verbal and written material and guidebook.  Patient learns about calorie density and how it impacts the Pritikin Eating Plan. Knowing the characteristics of the food you choose will  help you decide whether those foods will lead to weight gain or weight loss, and whether you want to consume more or less of them. Weight loss is usually a side effect of the Pritikin Eating Plan because of its focus on low calorie-dense foods.  Label Reading  Clinical staff conducted group or individual video education with verbal and written material and guidebook.  Patient learns about the Pritikin recommended label reading guidelines and corresponding recommendations regarding calorie density, added sugars, sodium content, and whole grains.  Dining Out - Part 1  Clinical staff conducted group or individual video education with verbal and written material and guidebook.  Patient learns that restaurant meals can be sabotaging because they can be so high in calories, fat, sodium, and/or sugar. Patient learns recommended strategies on how to positively address this and avoid unhealthy pitfalls.  Facts on Fats  Clinical staff conducted group or individual video education with verbal and written material and guidebook.  Patient learns that lifestyle modifications can be just as effective, if not more so, as many medications for lowering your risk of heart disease. A Pritikin lifestyle can help to reduce your risk of inflammation and atherosclerosis (cholesterol build-up, or plaque, in the artery walls). Lifestyle interventions such as dietary choices and physical activity address the cause of atherosclerosis. A review of the types of fats and their impact on blood cholesterol levels, along with dietary recommendations to reduce fat intake is also included.  Nutrition Action Plan  Clinical staff conducted group or individual video education with verbal and written material and guidebook.  Patient learns how to incorporate Pritikin recommendations into their lifestyle. Recommendations include planning and keeping personal health goals in mind as an important part of their success.  Healthy Mind-Set     Healthy Minds, Bodies, Hearts  Clinical staff conducted group or individual video education with verbal and written material and guidebook.  Patient learns how to identify when they are stressed. Video will discuss the impact of that stress, as well as the many benefits of stress management. Patient will also be introduced to stress management techniques. The way we think, act, and feel has an impact on our hearts.  How Our Thoughts Can Heal Our Hearts  Clinical staff conducted group or individual video education with verbal and written material and guidebook.  Patient learns that negative thoughts can cause depression and anxiety. This can result in negative lifestyle behavior and serious health problems. Cognitive behavioral therapy is an effective method to help control our thoughts in order to change and improve our emotional outlook.  Additional Videos:  Exercise    Improving Performance  Clinical staff conducted group or individual video education with verbal and written material and guidebook.  Patient learns to use a non-linear approach by alternating intensity levels and lengths of time spent exercising to help burn more calories and lose more body fat. Cardiovascular exercise helps improve heart health, metabolism, hormonal balance, blood sugar control, and recovery from fatigue. Resistance training improves strength, endurance, balance, coordination, reaction time, metabolism, and muscle mass. Flexibility exercise  improves circulation, posture, and balance. Seek guidance from your physician and exercise physiologist before implementing an exercise routine and learn your capabilities and proper form for all exercise.  Introduction to Yoga  Clinical staff conducted group or individual video education with verbal and written material and guidebook.  Patient learns about yoga, a discipline of the coming together of mind, breath, and body. The benefits of yoga include improved flexibility,  improved range of motion, better posture and core strength, increased lung function, weight loss, and positive self-image. Yoga's heart health benefits include lowered blood pressure, healthier heart rate, decreased cholesterol and triglyceride levels, improved immune function, and reduced stress. Seek guidance from your physician and exercise physiologist before implementing an exercise routine and learn your capabilities and proper form for all exercise.  Medical   Aging: Enhancing Your Quality of Life  Clinical staff conducted group or individual video education with verbal and written material and guidebook.  Patient learns key strategies and recommendations to stay in good physical health and enhance quality of life, such as prevention strategies, having an advocate, securing a Health Care Proxy and Power of Attorney, and keeping a list of medications and system for tracking them. It also discusses how to avoid risk for bone loss.  Biology of Weight Control  Clinical staff conducted group or individual video education with verbal and written material and guidebook.  Patient learns that weight gain occurs because we consume more calories than we burn (eating more, moving less). Even if your body weight is normal, you may have higher ratios of fat compared to muscle mass. Too much body fat puts you at increased risk for cardiovascular disease, heart attack, stroke, type 2 diabetes, and obesity-related cancers. In addition to exercise, following the Pritikin Eating Plan can help reduce your risk.  Decoding Lab Results  Clinical staff conducted group or individual video education with verbal and written material and guidebook.  Patient learns that lab test reflects one measurement whose values change over time and are influenced by many factors, including medication, stress, sleep, exercise, food, hydration, pre-existing medical conditions, and more. It is recommended to use the knowledge from this  video to become more involved with your lab results and evaluate your numbers to speak with your doctor.   Diseases of Our Time - Overview  Clinical staff conducted group or individual video education with verbal and written material and guidebook.  Patient learns that according to the CDC, 50% to 70% of chronic diseases (such as obesity, type 2 diabetes, elevated lipids, hypertension, and heart disease) are avoidable through lifestyle improvements including healthier food choices, listening to satiety cues, and increased physical activity.  Sleep Disorders Clinical staff conducted group or individual video education with verbal and written material and guidebook.  Patient learns how good quality and duration of sleep are important to overall health and well-being. Patient also learns about sleep disorders and how they impact health along with recommendations to address them, including discussing with a physician.  Nutrition  Dining Out - Part 2 Clinical staff conducted group or individual video education with verbal and written material and guidebook.  Patient learns how to plan ahead and communicate in order to maximize their dining experience in a healthy and nutritious manner. Included are recommended food choices based on the type of restaurant the patient is visiting.   Fueling a Banker conducted group or individual video education with verbal and written material and guidebook.  There is a strong connection  between our food choices and our health. Diseases like obesity and type 2 diabetes are very prevalent and are in large-part due to lifestyle choices. The Pritikin Eating Plan provides plenty of food and hunger-curbing satisfaction. It is easy to follow, affordable, and helps reduce health risks.  Menu Workshop  Clinical staff conducted group or individual video education with verbal and written material and guidebook.  Patient learns that restaurant meals can  sabotage health goals because they are often packed with calories, fat, sodium, and sugar. Recommendations include strategies to plan ahead and to communicate with the manager, chef, or server to help order a healthier meal.  Planning Your Eating Strategy  Clinical staff conducted group or individual video education with verbal and written material and guidebook.  Patient learns about the Pritikin Eating Plan and its benefit of reducing the risk of disease. The Pritikin Eating Plan does not focus on calories. Instead, it emphasizes high-quality, nutrient-rich foods. By knowing the characteristics of the foods, we choose, we can determine their calorie density and make informed decisions.  Targeting Your Nutrition Priorities  Clinical staff conducted group or individual video education with verbal and written material and guidebook.  Patient learns that lifestyle habits have a tremendous impact on disease risk and progression. This video provides eating and physical activity recommendations based on your personal health goals, such as reducing LDL cholesterol, losing weight, preventing or controlling type 2 diabetes, and reducing high blood pressure.  Vitamins and Minerals  Clinical staff conducted group or individual video education with verbal and written material and guidebook.  Patient learns different ways to obtain key vitamins and minerals, including through a recommended healthy diet. It is important to discuss all supplements you take with your doctor.   Healthy Mind-Set    Smoking Cessation  Clinical staff conducted group or individual video education with verbal and written material and guidebook.  Patient learns that cigarette smoking and tobacco addiction pose a serious health risk which affects millions of people. Stopping smoking will significantly reduce the risk of heart disease, lung disease, and many forms of cancer. Recommended strategies for quitting are covered, including  working with your doctor to develop a successful plan.  Culinary   Becoming a Set designer conducted group or individual video education with verbal and written material and guidebook.  Patient learns that cooking at home can be healthy, cost-effective, quick, and puts them in control. Keys to cooking healthy recipes will include looking at your recipe, assessing your equipment needs, planning ahead, making it simple, choosing cost-effective seasonal ingredients, and limiting the use of added fats, salts, and sugars.  Cooking - Breakfast and Snacks  Clinical staff conducted group or individual video education with verbal and written material and guidebook.  Patient learns how important breakfast is to satiety and nutrition through the entire day. Recommendations include key foods to eat during breakfast to help stabilize blood sugar levels and to prevent overeating at meals later in the day. Planning ahead is also a key component.  Cooking - Educational psychologist conducted group or individual video education with verbal and written material and guidebook.  Patient learns eating strategies to improve overall health, including an approach to cook more at home. Recommendations include thinking of animal protein as a side on your plate rather than center stage and focusing instead on lower calorie dense options like vegetables, fruits, whole grains, and plant-based proteins, such as beans. Making sauces in large quantities to freeze  for later and leaving the skin on your vegetables are also recommended to maximize your experience.  Cooking - Healthy Salads and Dressing Clinical staff conducted group or individual video education with verbal and written material and guidebook.  Patient learns that vegetables, fruits, whole grains, and legumes are the foundations of the Pritikin Eating Plan. Recommendations include how to incorporate each of these in flavorful and healthy  salads, and how to create homemade salad dressings. Proper handling of ingredients is also covered. Cooking - Soups and [employer] - Soups and Desserts Clinical staff conducted group or individual video education with verbal and written material and guidebook.  Patient learns that Pritikin soups and desserts make for easy, nutritious, and delicious snacks and meal components that are low in sodium, fat, sugar, and calorie density, while high in vitamins, minerals, and filling fiber. Recommendations include simple and healthy ideas for soups and desserts.   Overview     The Pritikin Solution Program Overview Clinical staff conducted group or individual video education with verbal and written material and guidebook.  Patient learns that the results of the Pritikin Program have been documented in more than 100 articles published in peer-reviewed journals, and the benefits include reducing risk factors for (and, in some cases, even reversing) high cholesterol, high blood pressure, type 2 diabetes, obesity, and more! An overview of the three key pillars of the Pritikin Program will be covered: eating well, doing regular exercise, and having a healthy mind-set.  WORKSHOPS  Exercise: Exercise Basics: Building Your Action Plan Clinical staff led group instruction and group discussion with PowerPoint presentation and patient guidebook. To enhance the learning environment the use of posters, models and videos may be added. At the conclusion of this workshop, patients will comprehend the difference between physical activity and exercise, as well as the benefits of incorporating both, into their routine. Patients will understand the FITT (Frequency, Intensity, Time, and Type) principle and how to use it to build an exercise action plan. In addition, safety concerns and other considerations for exercise and cardiac rehab will be addressed by the presenter. The purpose of this lesson is to promote a  comprehensive and effective weekly exercise routine in order to improve patients' overall level of fitness.   Managing Heart Disease: Your Path to a Healthier Heart Clinical staff led group instruction and group discussion with PowerPoint presentation and patient guidebook. To enhance the learning environment the use of posters, models and videos may be added.At the conclusion of this workshop, patients will understand the anatomy and physiology of the heart. Additionally, they will understand how Pritikin's three pillars impact the risk factors, the progression, and the management of heart disease.  The purpose of this lesson is to provide a high-level overview of the heart, heart disease, and how the Pritikin lifestyle positively impacts risk factors.  Exercise Biomechanics Clinical staff led group instruction and group discussion with PowerPoint presentation and patient guidebook. To enhance the learning environment the use of posters, models and videos may be added. Patients will learn how the structural parts of their bodies function and how these functions impact their daily activities, movement, and exercise. Patients will learn how to promote a neutral spine, learn how to manage pain, and identify ways to improve their physical movement in order to promote healthy living. The purpose of this lesson is to expose patients to common physical limitations that impact physical activity. Participants will learn practical ways to adapt and manage aches and pains, and to minimize  their effect on regular exercise. Patients will learn how to maintain good posture while sitting, walking, and lifting.  Balance Training and Fall Prevention  Clinical staff led group instruction and group discussion with PowerPoint presentation and patient guidebook. To enhance the learning environment the use of posters, models and videos may be added. At the conclusion of this workshop, patients will understand the  importance of their sensorimotor skills (vision, proprioception, and the vestibular system) in maintaining their ability to balance as they age. Patients will apply a variety of balancing exercises that are appropriate for their current level of function. Patients will understand the common causes for poor balance, possible solutions to these problems, and ways to modify their physical environment in order to minimize their fall risk. The purpose of this lesson is to teach patients about the importance of maintaining balance as they age and ways to minimize their risk of falling.  WORKSHOPS   Nutrition:  Fueling a Ship broker led group instruction and group discussion with PowerPoint presentation and patient guidebook. To enhance the learning environment the use of posters, models and videos may be added. Patients will review the foundational principles of the Pritikin Eating Plan and understand what constitutes a serving size in each of the food groups. Patients will also learn Pritikin-friendly foods that are better choices when away from home and review make-ahead meal and snack options. Calorie density will be reviewed and applied to three nutrition priorities: weight maintenance, weight loss, and weight gain. The purpose of this lesson is to reinforce (in a group setting) the key concepts around what patients are recommended to eat and how to apply these guidelines when away from home by planning and selecting Pritikin-friendly options. Patients will understand how calorie density may be adjusted for different weight management goals.  Mindful Eating  Clinical staff led group instruction and group discussion with PowerPoint presentation and patient guidebook. To enhance the learning environment the use of posters, models and videos may be added. Patients will briefly review the concepts of the Pritikin Eating Plan and the importance of low-calorie dense foods. The concept of mindful  eating will be introduced as well as the importance of paying attention to internal hunger signals. Triggers for non-hunger eating and techniques for dealing with triggers will be explored. The purpose of this lesson is to provide patients with the opportunity to review the basic principles of the Pritikin Eating Plan, discuss the value of eating mindfully and how to measure internal cues of hunger and fullness using the Hunger Scale. Patients will also discuss reasons for non-hunger eating and learn strategies to use for controlling emotional eating.  Targeting Your Nutrition Priorities Clinical staff led group instruction and group discussion with PowerPoint presentation and patient guidebook. To enhance the learning environment the use of posters, models and videos may be added. Patients will learn how to determine their genetic susceptibility to disease by reviewing their family history. Patients will gain insight into the importance of diet as part of an overall healthy lifestyle in mitigating the impact of genetics and other environmental insults. The purpose of this lesson is to provide patients with the opportunity to assess their personal nutrition priorities by looking at their family history, their own health history and current risk factors. Patients will also be able to discuss ways of prioritizing and modifying the Pritikin Eating Plan for their highest risk areas  Menu  Clinical staff led group instruction and group discussion with PowerPoint presentation and patient guidebook.  To enhance the learning environment the use of posters, models and videos may be added. Using menus brought in from [employer], or printed from [employer], patients will apply the Pritikin dining out guidelines that were presented in the [employer] video. Patients will also be able to practice these guidelines in a variety of provided scenarios. The purpose of this lesson is to provide  patients with the opportunity to practice hands-on learning of the Pritikin Dining Out guidelines with actual menus and practice scenarios.  Label Reading Clinical staff led group instruction and group discussion with PowerPoint presentation and patient guidebook. To enhance the learning environment the use of posters, models and videos may be added. Patients will review and discuss the Pritikin label reading guidelines presented in Pritikin's Label Reading Educational series video. Using fool labels brought in from local grocery stores and markets, patients will apply the label reading guidelines and determine if the packaged food meet the Pritikin guidelines. The purpose of this lesson is to provide patients with the opportunity to review, discuss, and practice hands-on learning of the Pritikin Label Reading guidelines with actual packaged food labels. Cooking School  Pritikin's [employer] are designed to teach patients ways to prepare quick, simple, and affordable recipes at home. The importance of nutrition's role in chronic disease risk reduction is reflected in its emphasis in the overall Pritikin program. By learning how to prepare essential core Pritikin Eating Plan recipes, patients will increase control over what they eat; be able to customize the flavor of foods without the use of added salt, sugar, or fat; and improve the quality of the food they consume. By learning a set of core recipes which are easily assembled, quickly prepared, and affordable, patients are more likely to prepare more healthy foods at home. These workshops focus on convenient breakfasts, simple entres, side dishes, and desserts which can be prepared with minimal effort and are consistent with nutrition recommendations for cardiovascular risk reduction. Cooking [employer] are taught by a Armed forces logistics/support/administrative officer (RD) who has been trained by the [employer]. The chef or RD has a clear  understanding of the importance of minimizing - if not completely eliminating - added fat, sugar, and sodium in recipes. Throughout the series of Cooking School Workshop sessions, patients will learn about healthy ingredients and efficient methods of cooking to build confidence in their capability to prepare    Cooking School weekly topics:  Adding Flavor- Sodium-Free  Fast and Healthy Breakfasts  Powerhouse Plant-Based Proteins  Satisfying Salads and Dressings  Simple Sides and Sauces  International Cuisine-Spotlight on the [employer] Zones  Delicious Desserts  Savory Soups  [employer] - Meals in a Astronomer Appetizers and Snacks  Comforting Weekend Breakfasts  One-Pot Wonders   Fast Evening Meals  Landscape architect Your Pritikin Plate  WORKSHOPS   Healthy Mindset (Psychosocial):  Focused Goals, Sustainable Changes Clinical staff led group instruction and group discussion with PowerPoint presentation and patient guidebook. To enhance the learning environment the use of posters, models and videos may be added. Patients will be able to apply effective goal setting strategies to establish at least one personal goal, and then take consistent, meaningful action toward that goal. They will learn to identify common barriers to achieving personal goals and develop strategies to overcome them. Patients will also gain an understanding of how our mind-set can impact our ability to achieve goals and the importance of cultivating a positive and  growth-oriented mind-set. The purpose of this lesson is to provide patients with a deeper understanding of how to set and achieve personal goals, as well as the tools and strategies needed to overcome common obstacles which may arise along the way.  From Head to Heart: The Power of a Healthy Outlook  Clinical staff led group instruction and group discussion with PowerPoint presentation and patient guidebook. To enhance the learning  environment the use of posters, models and videos may be added. Patients will be able to recognize and describe the impact of emotions and mood on physical health. They will discover the importance of self-care and explore self-care practices which may work for them. Patients will also learn how to utilize the 4 C's to cultivate a healthier outlook and better manage stress and challenges. The purpose of this lesson is to demonstrate to patients how a healthy outlook is an essential part of maintaining good health, especially as they continue their cardiac rehab journey.  Healthy Sleep for a Healthy Heart Clinical staff led group instruction and group discussion with PowerPoint presentation and patient guidebook. To enhance the learning environment the use of posters, models and videos may be added. At the conclusion of this workshop, patients will be able to demonstrate knowledge of the importance of sleep to overall health, well-being, and quality of life. They will understand the symptoms of, and treatments for, common sleep disorders. Patients will also be able to identify daytime and nighttime behaviors which impact sleep, and they will be able to apply these tools to help manage sleep-related challenges. The purpose of this lesson is to provide patients with a general overview of sleep and outline the importance of quality sleep. Patients will learn about a few of the most common sleep disorders. Patients will also be introduced to the concept of "sleep hygiene," and discover ways to self-manage certain sleeping problems through simple daily behavior changes. Finally, the workshop will motivate patients by clarifying the links between quality sleep and their goals of heart-healthy living.   Recognizing and Reducing Stress Clinical staff led group instruction and group discussion with PowerPoint presentation and patient guidebook. To enhance the learning environment the use of posters, models and videos  may be added. At the conclusion of this workshop, patients will be able to understand the types of stress reactions, differentiate between acute and chronic stress, and recognize the impact that chronic stress has on their health. They will also be able to apply different coping mechanisms, such as reframing negative self-talk. Patients will have the opportunity to practice a variety of stress management techniques, such as deep abdominal breathing, progressive muscle relaxation, and/or guided imagery.  The purpose of this lesson is to educate patients on the role of stress in their lives and to provide healthy techniques for coping with it.  Learning Barriers/Preferences:  Learning Barriers/Preferences - 01/26/23 1400       Learning Barriers/Preferences   Learning Barriers Sight   glasses   Learning Preferences Audio;Computer/Internet;Group Instruction;Individual Instruction;Pictoral;Skilled Demonstration;Verbal Instruction;Video;Written Material             Education Topics:  Knowledge Questionnaire Score:  Knowledge Questionnaire Score - 01/26/23 1417       Knowledge Questionnaire Score   Pre Score 23/24             Core Components/Risk Factors/Patient Goals at Admission:  Personal Goals and Risk Factors at Admission - 01/26/23 1400       Core Components/Risk Factors/Patient Goals on Admission  Weight Management Yes;Weight Loss   pt goal   Intervention Weight Management: Develop a combined nutrition and exercise program designed to reach desired caloric intake, while maintaining appropriate intake of nutrient and fiber, sodium and fats, and appropriate energy expenditure required for the weight goal.;Weight Management: Provide education and appropriate resources to help participant work on and attain dietary goals.    Expected Outcomes Short Term: Continue to assess and modify interventions until short term weight is achieved;Long Term: Adherence to nutrition and physical  activity/exercise program aimed toward attainment of established weight goal;Weight Loss: Understanding of general recommendations for a balanced deficit meal plan, which promotes 1-2 lb weight loss per week and includes a negative energy balance of [phone] kcal/d;Understanding recommendations for meals to include 15-35% energy as protein, 25-35% energy from fat, 35-60% energy from carbohydrates, less than 200mg  of dietary cholesterol, 20-35 gm of total fiber daily;Understanding of distribution of calorie intake throughout the day with the consumption of 4-5 meals/snacks    Heart Failure Yes    Intervention Provide a combined exercise and nutrition program that is supplemented with education, support and counseling about heart failure. Directed toward relieving symptoms such as shortness of breath, decreased exercise tolerance, and extremity edema.    Expected Outcomes Improve functional capacity of life;Short term: Attendance in program 2-3 days a week with increased exercise capacity. Reported lower sodium intake. Reported increased fruit and vegetable intake. Reports medication compliance.;Short term: Daily weights obtained and reported for increase. Utilizing diuretic protocols set by physician.;Long term: Adoption of self-care skills and reduction of barriers for early signs and symptoms recognition and intervention leading to self-care maintenance.    Hypertension Yes    Intervention Provide education on lifestyle modifcations including regular physical activity/exercise, weight management, moderate sodium restriction and increased consumption of fresh fruit, vegetables, and low fat dairy, alcohol moderation, and smoking cessation.;Monitor prescription use compliance.    Expected Outcomes Short Term: Continued assessment and intervention until BP is < 140/30mm HG in hypertensive participants. < 130/76mm HG in hypertensive participants with diabetes, heart failure or chronic kidney disease.;Long Term:  Maintenance of blood pressure at goal levels.    Stress Yes    Intervention Offer individual and/or small group education and counseling on adjustment to heart disease, stress management and health-related lifestyle change. Teach and support self-help strategies.;Refer participants experiencing significant psychosocial distress to appropriate mental health specialists for further evaluation and treatment. When possible, include family members and significant others in education/counseling sessions.    Expected Outcomes Short Term: Participant demonstrates changes in health-related behavior, relaxation and other stress management skills, ability to obtain effective social support, and compliance with psychotropic medications if prescribed.;Long Term: Emotional wellbeing is indicated by absence of clinically significant psychosocial distress or social isolation.             Core Components/Risk Factors/Patient Goals Review:   Goals and Risk Factor Review     Row Name 02/12/23 0849 02/17/23 1249 03/16/23 1727 04/14/23 1504       Core Components/Risk Factors/Patient Goals Review   Personal Goals Review Weight Management/Obesity;Heart Failure;Hypertension;Stress Weight Management/Obesity;Heart Failure;Hypertension;Stress Weight Management/Obesity;Heart Failure;Hypertension;Stress Weight Management/Obesity;Heart Failure;Hypertension;Stress    Review Hannah Bullock started cardiac rehab on 02/09/23. Hannah Bullock did very  well with exercise. Vital signs were stable. Hannah Bullock is off to a good start to exercise.. Vital signs have been stable. Hannah Bullock has been increasing her met levels. Hannah Bullock is doing well with exercise at cardiac rehab. Vital signs have been stable. Hannah Bullock has been increasing her met levels.  Hannah Bullock continues to do  well with exercise at cardiac rehab. Vital signs remain stable. Hannah Bullock contiues to  increase her met levels.    Expected Outcomes Demetress will continue to participate in cardiac rehab for exercise, nutrition and  lifestyle modifications Danaye will continue to participate in cardiac rehab for exercise, nutrition and lifestyle modifications Prarthana will continue to participate in cardiac rehab for exercise, nutrition and lifestyle modifications Kiernan will continue to participate in cardiac rehab for exercise, nutrition and lifestyle modifications             Core Components/Risk Factors/Patient Goals at Discharge (Final Review):   Goals and Risk Factor Review - 04/14/23 1504       Core Components/Risk Factors/Patient Goals Review   Personal Goals Review Weight Management/Obesity;Heart Failure;Hypertension;Stress    Review Psalm continues to do  well with exercise at cardiac rehab. Vital signs remain stable. Amandajo contiues to  increase her met levels.    Expected Outcomes Tiffani will continue to participate in cardiac rehab for exercise, nutrition and lifestyle modifications             ITP Comments:  ITP Comments     Row Name 01/26/23 1346 02/12/23 0827 02/17/23 1245 03/16/23 1725 04/14/23 1502   ITP Comments Dr. Armanda Magic medical director. Introduction to pritikin education/ intensive cardiac rehab. Initial orientation packet reviewed with patient. 30 Day ITP Review. Waynesha started cardiac rehab on 02/09/23. Foxworthy did very well with exercise. 30 Day ITP Review. Goldia started cardiac rehab on 02/09/23. Kue is off to a good start with exercise. 30 Day ITP Review. Nakaiya has good attendance and particpation in  cardiac rehab . 30 Day ITP Review. Auria has good attendance and particpation in  cardiac rehab .            Comments: See ITP comments.Thayer Headings RN BSN

## 2023-04-15 ENCOUNTER — Encounter (HOSPITAL_COMMUNITY): Payer: 59

## 2023-04-16 ENCOUNTER — Other Ambulatory Visit (HOSPITAL_COMMUNITY): Payer: Self-pay | Admitting: Cardiology

## 2023-04-17 ENCOUNTER — Encounter (HOSPITAL_COMMUNITY): Payer: 59

## 2023-04-20 ENCOUNTER — Encounter (HOSPITAL_COMMUNITY): Payer: 59

## 2023-04-22 ENCOUNTER — Encounter (HOSPITAL_COMMUNITY)
Admission: RE | Admit: 2023-04-22 | Discharge: 2023-04-22 | Disposition: A | Discharge status: Home / Self Care | Payer: 59 | Source: Ambulatory Visit | Attending: Cardiology | Admitting: Cardiology

## 2023-04-22 DIAGNOSIS — I5022 Chronic systolic (congestive) heart failure: Secondary | ICD-10-CM

## 2023-04-23 ENCOUNTER — Ambulatory Visit (HOSPITAL_BASED_OUTPATIENT_CLINIC_OR_DEPARTMENT_OTHER)
Admission: RE | Admit: 2023-04-23 | Discharge: 2023-04-23 | Disposition: A | Discharge status: Home / Self Care | Payer: 59 | Source: Ambulatory Visit | Attending: Cardiology | Admitting: Cardiology

## 2023-04-23 ENCOUNTER — Ambulatory Visit (HOSPITAL_COMMUNITY)
Admission: RE | Admit: 2023-04-23 | Discharge: 2023-04-23 | Disposition: A | Discharge status: Home / Self Care | Payer: 59 | Source: Ambulatory Visit | Attending: Cardiology | Admitting: Cardiology

## 2023-04-23 ENCOUNTER — Encounter (HOSPITAL_COMMUNITY): Payer: Self-pay | Admitting: Cardiology

## 2023-04-23 VITALS — BP 140/88 | HR 89 | Wt 170.4 lb

## 2023-04-23 DIAGNOSIS — F1010 Alcohol abuse, uncomplicated: Secondary | ICD-10-CM | POA: Insufficient documentation

## 2023-04-23 DIAGNOSIS — I110 Hypertensive heart disease with heart failure: Secondary | ICD-10-CM | POA: Insufficient documentation

## 2023-04-23 DIAGNOSIS — I5022 Chronic systolic (congestive) heart failure: Secondary | ICD-10-CM | POA: Diagnosis not present

## 2023-04-23 DIAGNOSIS — Z7984 Long term (current) use of oral hypoglycemic drugs: Secondary | ICD-10-CM | POA: Diagnosis not present

## 2023-04-23 DIAGNOSIS — I428 Other cardiomyopathies: Secondary | ICD-10-CM | POA: Diagnosis not present

## 2023-04-23 DIAGNOSIS — R000 Tachycardia, unspecified: Secondary | ICD-10-CM | POA: Insufficient documentation

## 2023-04-23 DIAGNOSIS — Z79899 Other long term (current) drug therapy: Secondary | ICD-10-CM | POA: Diagnosis not present

## 2023-04-23 DIAGNOSIS — Z006 Encounter for examination for normal comparison and control in clinical research program: Secondary | ICD-10-CM

## 2023-04-23 LAB — ECHOCARDIOGRAM COMPLETE
AR max vel: 2.56 cm2
AV Area VTI: 2.57 cm2
AV Area mean vel: 2.47 cm2
AV Mean grad: 2 mmHg
AV Peak grad: 4 mmHg
Ao pk vel: 1 m/s
Area-P 1/2: 6.27 cm2
Calc EF: 54.4 %
S' Lateral: 3.5 cm
Single Plane A2C EF: 59.2 %
Single Plane A4C EF: 51.7 %

## 2023-04-23 MED ORDER — CARVEDILOL 12.5 MG PO TABS: 12.5000 mg | ORAL_TABLET | Freq: Two times a day (BID) | ORAL | 5 refills | Status: DC

## 2023-04-23 MED ORDER — CARVEDILOL 12.5 MG PO TABS: 12.5000 mg | ORAL_TABLET | Freq: Two times a day (BID) | ORAL | 3 refills | Status: DC

## 2023-04-23 NOTE — Research (Signed)
 SITE: [id number]     Subject # [id number]  Subprotocol: A  Inclusion Criteria  Patients who meet all of the following criteria are eligible for enrollment as study participants:  Yes No  Age > 47 years old X   Eligible to wear Holter Study X    Exclusion Criteria  Patients who meet any of these criteria are not eligible for enrollment as study participants: Yes No  1. Receiving any mechanical (respiratory or circulatory) or renal support therapy at Screening or during Visit #1.  X  2.  Any other conditions that in the opinion of the investigators are likely to prevent compliance with the study protocol or pose a safety concern if the subject participates in the study.  X  3. Poor tolerance, namely susceptible to severe skin allergies from ECG adhesive patch application.  X   Protocol: REV H                                     Residential Zip code [phone] (First 3 digits ONLY)                                             [facility] Informed Consent   Subject Name: Hannah Bullock  Subject met inclusion and exclusion criteria.  The informed consent form, study requirements and expectations were reviewed with the subject. Subject had opportunity to read consent and questions and concerns were addressed prior to the signing of the consent form.  The subject verbalized understanding of the trial requirements.  The subject agreed to participate in the [facility] EF ACT trial and signed the informed consent at 13:45 on 23-Apr-2023.  The informed consent was obtained prior to performance of any protocol-specific procedures for the subject.  A copy of the signed informed consent was given to the subject and a copy was placed in the subject's medical record.   Dyanne Iha          Current Outpatient Medications:    albuterol (VENTOLIN HFA) 108 (90 Base) MCG/ACT inhaler, Inhale 2 puffs into the lungs every 4 (four) hours as needed., Disp: , Rfl:    amphetamine-dextroamphetamine (ADDERALL XR) 20 MG 24 hr  capsule, Take 20 mg by mouth every morning., Disp: , Rfl:    carvedilol (COREG) 12.5 MG tablet, Take 1 tablet (12.5 mg total) by mouth 2 (two) times daily with a meal., Disp: 180 tablet, Rfl: 3   Cholecalciferol (VITAMIN D-3 PO), Take 1 capsule by mouth daily., Disp: , Rfl:    DHEA 25 MG CAPS, Take 1 capsule by mouth daily., Disp: , Rfl:    empagliflozin (JARDIANCE) 10 MG TABS tablet, Take 1 tablet (10 mg total) by mouth daily., Disp: 30 tablet, Rfl: 11   estrogens, conjugated, (PREMARIN) 1.25 MG tablet, Take 1.25 mg by mouth daily., Disp: , Rfl:    furosemide (LASIX) 20 MG tablet, TAKE 1 TABLET BY MOUTH DAILY AS NEEDED FOR FLUID OR EDEMA (WEIGHT GAIN OF 3LB IN 1 DAY OR 5LB IN 1 WEEK)., Disp: 90 tablet, Rfl: 1   hydrOXYzine (ATARAX) 25 MG tablet, Take 25-50 mg by mouth at bedtime., Disp: , Rfl:    loratadine (CLARITIN) 10 MG tablet, Take 10 mg by mouth daily., Disp: , Rfl:    progesterone (PROMETRIUM) 100 MG  capsule, Take 100 mg by mouth daily., Disp: , Rfl:    sacubitril-valsartan (ENTRESTO) 49-51 MG, Take 1 tablet by mouth 2 (two) times daily., Disp: 60 tablet, Rfl: 11   spironolactone (ALDACTONE) 25 MG tablet, Take 1 tablet (25 mg total) by mouth daily., Disp: 90 tablet, Rfl: 3   traZODone (DESYREL) 50 MG tablet, Take 50-100 mg by mouth at bedtime., Disp: , Rfl:

## 2023-04-23 NOTE — Patient Instructions (Signed)
 Good to see you today!  STOP Digoxin  TAKE Coreg 9.375 mg Twice daily until it comes in  Coreg 12.5 mg Twice daily  Your physician recommends that you schedule a follow-up appointment in:  6 months(September 2025) Call office in July to schedule an appointment  If you have any questions or concerns before your next appointment please send [place] a message through [place] or call our office at [phone].    TO LEAVE A MESSAGE FOR THE NURSE SELECT OPTION 2, PLEASE LEAVE A MESSAGE INCLUDING: YOUR NAME DATE OF BIRTH CALL BACK NUMBER REASON FOR CALL**this is important as we prioritize the call backs  YOU WILL RECEIVE A CALL BACK THE SAME DAY AS LONG AS YOU CALL BEFORE 4:00 PM At the [facility], you and your health needs are our priority. As part of our continuing mission to provide you with exceptional heart care, we have created designated Provider Care Teams. These Care Teams include your primary Cardiologist (physician) and Advanced Practice Providers (APPs- Physician Assistants and Nurse Practitioners) who all work together to provide you with the care you need, when you need it.   You may see any of the following providers on your designated Care Team at your next follow up: Dr Arvilla Meres Dr Marca Ancona Dr. Dorthula Nettles Dr. Clearnce Hasten Amy Filbert Schilder, NP Robbie Lis, [place] [facility] [place], [place] Brynda Peon, NP [place] Lee, NP Clarisa Kindred, NP Karle Plumber, PharmD Enos Fling, PharmD   Please be sure to bring in all your medications bottles to every appointment.    Thank you for choosing [facility]

## 2023-04-23 NOTE — Progress Notes (Signed)
 ADVANCED HEART FAILURE FOLLOW UP CLINIC NOTE  Referring Physician: Leilani Able, MD  Primary Care: Leilani Able, MD Primary Cardiologist:  HPI: Hannah Bullock is a 48 y.o. female with a PMH of HTN, chronic ETOH use disorder, generalized anxiety disorder on Adderal, h/o gastric ulcers/GIB, tobacco use, HFrEF who presents for follow up of chronic systolic heart failure.      No previous cardiac history, presented to the ED 10/24 w/ complaints of increasing exertional dyspnea, dry cough and congestion. In the ED was noted to be tachycardic and hypertensive.  Echo showed severely reduced LVEF 25-30%, global HK, LV mildly dilated, RV normal. She was diuresed w/ IV Lasix and placed on HF GDMT/ antihypertensive regimen. Underwent R/LHC which demonstrated no significant coronary disease, mildly elevated left and right heart filling pressures (RA/RVEDP 10 mmHg, PCWP 15 mmHg, LVEDP 18 mmHg and mild to moderately reduced Fick cardiac output/index (CO 3.6 L/min, CI 2.0 L/min/m^2). During the case, she was note to have transient hypotension "suspected to be due to combination of sedation and vasovagal response, successfully treated with normal saline and phenylephrine boluses". She was diuresed further and she was started all 4 pillars of goal-directed medical therapy, however it was noted that several dose reductions were made due to soft BP. She was referred to [facility] clinic at d/c. D/c wt was 167 lb.         SUBJECTIVE:  Patient overall feels well. She has been doing well at cardiac rehab. Has occasional palpitations, they are not bothersome. She is able to work without issues, though has to take more breaks than before. Weight has been stable since discharge, only takes lasix occasionally.   PMH, current medications, allergies, social history, and family history reviewed in epic.  PHYSICAL EXAM: Vitals:   04/23/23 1347  BP: (!) 140/88  Pulse: 89  SpO2: 98%   GENERAL: Well nourished and in no  apparent distress at rest.  HEENT: The mucous membranes are pink and moist.   PULM:  Normal work of breathing, clear to auscultation bilaterally. Respirations are unlabored.  CARDIAC:  JVP: not elevated         Normal rate with regular rhythm. No murmurs, rubs or gallops.  No lower extremity edema.  ABDOMEN: Soft, non-tender, non-distended. NEUROLOGIC: Patient is oriented x3 with no focal or lateralizing neurologic deficits.  PSYCH: Patients affect is appropriate, there is no evidence of anxiety or depression.  SKIN: Warm and dry; no lesions or wounds. Warm and well perfused extremities.  DATA REVIEW  ECG: 12/24: Sinus tachycardia with PVCs 1/25: Sinus tachycardia with strain, minimal PVCs   ECHO: 10/24: LVEF 25 to 30%, global hypokinesis, mildly dilated.  Normal RV size and function.  Mild to moderate MR.   CATH: 12/08/2022: Left heart catheterization with no angiographically significant coronary disease.  Right heart cath: RA 10, PCWP 15, PA 27/11, 18, assumed Fick cardiac output 3.58/1.98  Heart failure review: - Classification: Heart failure with reduced EF - Etiology: Work up ongoing - NYHA Class: II - Volume status: Euvolemic - ACEi/ARB/ARNI: Currently up-titrating - Aldosterone antagonist: Maximally tolerated dose - Beta-blocker: Currently up-titrating - Digoxin: Maximally tolerated dose - Hydralazine/Nitrates: Not indicated - SGLT2i: Maximally tolerated dose - GLP-1: Not a candidate - Advanced therapies: Not needed at this time - ICD: Currently uptitrating GDMT  ASSESSMENT & PLAN:  Chronic Systolic Heart Failure: Echo 10/24 EF 25-30%, RV nl, NICM. Etiology includes ETOH mediated (though not drinking heavily enough likely) +/- Hypertensive Heart Disease (BP  markedly elevated on admit) +/- viral (recent proceeding URI symptoms prior to HF symptoms). Ejection fraction significantly improved on follow up echocardiogram. Will continue on medical therapy, can stop digoxin.   - NYHA Class II. Euvolemic on exam. Tachycardic. BP normotensive  - Continue Entersto to 49-51 mg bid. - Continue Jardiance 10 mg daily. No GU symptoms - Continue Spironolactone 25 mg daily - Increase coreg to 12.5mg  BID - Stop digoxin - Continue Lasix 20 mg PRN - discuss importance of avoiding pregnancy   Hypertension  - controlled on current regimen   ETOH Use Disorder  - significantly improved    Clearnce Hasten, MD Advanced [facility] Failure Mechanical Circulatory Support 04/23/23

## 2023-04-24 ENCOUNTER — Encounter (HOSPITAL_COMMUNITY): Admission: RE | Admit: 2023-04-24 | Payer: 59 | Source: Ambulatory Visit

## 2023-04-24 ENCOUNTER — Encounter (HOSPITAL_COMMUNITY)
Admission: RE | Admit: 2023-04-24 | Discharge: 2023-04-24 | Disposition: A | Discharge status: Home / Self Care | Source: Ambulatory Visit | Attending: Cardiology | Admitting: Cardiology

## 2023-04-24 DIAGNOSIS — I5022 Chronic systolic (congestive) heart failure: Secondary | ICD-10-CM | POA: Diagnosis not present

## 2023-04-27 ENCOUNTER — Encounter (HOSPITAL_COMMUNITY): Admission: RE | Admit: 2023-04-27 | Source: Ambulatory Visit

## 2023-04-29 ENCOUNTER — Encounter (HOSPITAL_COMMUNITY)
Admission: RE | Admit: 2023-04-29 | Discharge: 2023-04-29 | Disposition: A | Discharge status: Home / Self Care | Source: Ambulatory Visit | Attending: Cardiology | Admitting: Cardiology

## 2023-04-29 DIAGNOSIS — I5022 Chronic systolic (congestive) heart failure: Secondary | ICD-10-CM

## 2023-05-01 ENCOUNTER — Encounter (HOSPITAL_COMMUNITY)
Admission: RE | Admit: 2023-05-01 | Discharge: 2023-05-01 | Disposition: A | Discharge status: Home / Self Care | Source: Ambulatory Visit | Attending: Cardiology | Admitting: Cardiology

## 2023-05-01 ENCOUNTER — Encounter (HOSPITAL_COMMUNITY)

## 2023-05-01 DIAGNOSIS — I5022 Chronic systolic (congestive) heart failure: Secondary | ICD-10-CM

## 2023-05-04 ENCOUNTER — Encounter (HOSPITAL_COMMUNITY)

## 2023-05-06 ENCOUNTER — Encounter (HOSPITAL_COMMUNITY): Admission: RE | Admit: 2023-05-06 | Source: Ambulatory Visit

## 2023-05-06 ENCOUNTER — Telehealth (HOSPITAL_COMMUNITY): Payer: Self-pay

## 2023-05-06 NOTE — Telephone Encounter (Signed)
 Patient called out for 1:45pm class. She states she had a stomach bug (no fever or cough) and her symptoms stopped yesterday but since she is not 48 hour symptom-free she did not want to come in today.  She is requesting we reschedule her 1:45pm class on Friday 05/08/23 to 12:30pm to better accommodate her lunch break.

## 2023-05-08 ENCOUNTER — Telehealth (HOSPITAL_COMMUNITY): Payer: Self-pay | Admitting: Cardiology

## 2023-05-08 ENCOUNTER — Encounter (HOSPITAL_COMMUNITY)

## 2023-05-08 ENCOUNTER — Encounter (HOSPITAL_COMMUNITY): Admission: RE | Admit: 2023-05-08 | Source: Ambulatory Visit

## 2023-05-08 NOTE — Telephone Encounter (Signed)
 Patient left VM on triage line with reports of low b/p and increase in fatigue after cardiac rehab  Returned call for details No answer [facility]

## 2023-05-08 NOTE — Progress Notes (Signed)
 Cardiac Individual Treatment Plan  Patient Details  Name: Hannah Bullock MRN: [id number] Date of Birth: [birth year] Referring Provider:   Flowsheet Row INTENSIVE CARDIAC REHAB ORIENT from 01/26/2023 in [facility] for Heart, Vascular, & Lung Health  Referring Provider Clearnce Hasten, MD       Initial Encounter Date:  Flowsheet Row INTENSIVE CARDIAC REHAB ORIENT from 01/26/2023 in [facility] for Heart, Vascular, & Lung [facility]  Date 01/26/23       Visit Diagnosis: Heart failure, chronic systolic (HCC)  Patient's Home Medications on Admission:  Current Outpatient Medications:    albuterol (VENTOLIN HFA) 108 (90 Base) MCG/ACT inhaler, Inhale 2 puffs into the lungs every 4 (four) hours as needed., Disp: , Rfl:    amphetamine-dextroamphetamine (ADDERALL XR) 20 MG 24 hr capsule, Take 20 mg by mouth every morning., Disp: , Rfl:    carvedilol (COREG) 12.5 MG tablet, Take 1 tablet (12.5 mg total) by mouth 2 (two) times daily with a meal., Disp: 180 tablet, Rfl: 3   Cholecalciferol (VITAMIN D-3 PO), Take 1 capsule by mouth daily., Disp: , Rfl:    DHEA 25 MG CAPS, Take 1 capsule by mouth daily., Disp: , Rfl:    empagliflozin (JARDIANCE) 10 MG TABS tablet, Take 1 tablet (10 mg total) by mouth daily., Disp: 30 tablet, Rfl: 11   estrogens, conjugated, (PREMARIN) 1.25 MG tablet, Take 1.25 mg by mouth daily., Disp: , Rfl:    furosemide (LASIX) 20 MG tablet, TAKE 1 TABLET BY MOUTH DAILY AS NEEDED FOR FLUID OR EDEMA (WEIGHT GAIN OF 3LB IN 1 DAY OR 5LB IN 1 WEEK)., Disp: 90 tablet, Rfl: 1   hydrOXYzine (ATARAX) 25 MG tablet, Take 25-50 mg by mouth at bedtime., Disp: , Rfl:    loratadine (CLARITIN) 10 MG tablet, Take 10 mg by mouth daily., Disp: , Rfl:    progesterone (PROMETRIUM) 100 MG capsule, Take 100 mg by mouth daily., Disp: , Rfl:    sacubitril-valsartan (ENTRESTO) 49-51 MG, Take 1 tablet by mouth 2 (two) times daily., Disp: 60 tablet, Rfl: 11    spironolactone (ALDACTONE) 25 MG tablet, Take 1 tablet (25 mg total) by mouth daily., Disp: 90 tablet, Rfl: 3   traZODone (DESYREL) 50 MG tablet, Take 50-100 mg by mouth at bedtime., Disp: , Rfl:   Past Medical History: Past Medical History:  Diagnosis Date   Anxiety    Medical history non-contributory     Tobacco Use: Social History   Tobacco Use  Smoking Status Former   Current packs/day: 0.00   Types: Cigarettes   Quit date: 04/06/2016   Years since quitting: 7.0  Smokeless Tobacco Never    Labs: Review Flowsheet       Latest Ref Rng & Units 12/07/2022 12/08/2022  Labs for ITP Cardiac and Pulmonary Rehab  Cholestrol 0 - 200 mg/dL - 782   LDL (calc) 0 - 99 mg/dL - 96   HDL-C >95 mg/dL - 69   Trlycerides <621 mg/dL - 308   Hemoglobin M5H 4.8 - 5.6 % 5.1  -  PH, Arterial 7.35 - 7.45 - 7.394   PCO2 arterial 32 - 48 mmHg - 37.7   Bicarbonate 20.0 - 28.0 mmol/L - 23.0  25.6   TCO2 22 - 32 mmol/L - 24  27   Acid-base deficit 0.0 - 2.0 mmol/L - 2.0   O2 Saturation % - 94  60     Details       Multiple values  from one day are sorted in reverse-chronological order         Capillary Blood Glucose: No results found for: "GLUCAP"   Exercise Target Goals: Exercise Program Goal: Individual exercise prescription set using results from initial 6 min walk test and THRR while considering  patient's activity barriers and safety.   Exercise Prescription Goal: Initial exercise prescription builds to 30-45 minutes a day of aerobic activity, 2-3 days per week.  Home exercise guidelines will be given to patient during program as part of exercise prescription that the participant will acknowledge.  Activity Barriers & Risk Stratification:  Activity Barriers & Cardiac Risk Stratification - 01/26/23 1359       Activity Barriers & Cardiac Risk Stratification   Activity Barriers Decreased Ventricular Function;Back Problems    Cardiac Risk Stratification High   <5 METs on             6 Minute Walk:  6 Minute Walk     Row Name 01/26/23 1527         6 Minute Walk   Phase Initial     Distance 1610 feet     Walk Time 6 minutes     # of Rest Breaks 0     MPH 3.05     METS 4.71     RPE 9     Perceived Dyspnea  0     VO2 Peak 16.5     Symptoms No     Resting HR 95 bpm     Resting BP 122/84     Resting Oxygen Saturation  95 %     Exercise Oxygen Saturation  during 6 min walk 95 %     Max Ex. HR 112 bpm     Max Ex. BP 126/72     2 Minute Post BP 122/80              Oxygen Initial Assessment:   Oxygen Re-Evaluation:   Oxygen Discharge (Final Oxygen Re-Evaluation):   Initial Exercise Prescription:  Initial Exercise Prescription - 01/26/23 1500       Date of Initial Exercise RX and Referring Provider   Date 01/26/23    Referring Provider Clearnce Hasten, MD    Expected Discharge Date 04/22/23      Treadmill   MPH 2.8    Grade 0    Minutes 15    METs 3.2      Elliptical   Level 1    Speed 1    Minutes 15    METs 3      Prescription Details   Frequency (times per week) 3    Duration Progress to 30 minutes of continuous aerobic without signs/symptoms of physical distress      Intensity   THRR 40-80% of Max Heartrate 69-139    Ratings of Perceived Exertion 11-13    Perceived Dyspnea 0-4      Progression   Progression Continue progressive overload as per policy without signs/symptoms or physical distress.      Resistance Training   Training Prescription Yes    Weight 3    Reps 10-15             Perform Capillary Blood Glucose checks as needed.  Exercise Prescription Changes:   Exercise Prescription Changes     Row Name 02/09/23 1600 02/23/23 1630 03/09/23 1622 03/23/23 1627 04/08/23 1600     Response to Exercise   Blood Pressure (Admit) 118/70 120/82 100/72 112/72 128/72  Blood Pressure (Exercise) 134/78 120/66 118/78 -- --   Blood Pressure (Exit) 122/78 108/68 118/70 120/70 106/70   Heart Rate  (Admit) 99 bpm 104 bpm 100 bpm 95 bpm 96 bpm   Heart Rate (Exercise) 144 bpm 130 bpm 149 bpm 137 bpm 133 bpm   Heart Rate (Exit) 104 bpm 104 bpm 109 bpm 101 bpm 104 bpm   Rating of Perceived Exertion (Exercise) 9 9 10.5 8 8.5   Perceived Dyspnea (Exercise) 0 0 0 0 --   Symptoms None None Elevated HR at entry, resolved to under 100BPM with rest -- none   Comments Pt first day in CRP2 program Reviewed MET's and goals Reviewed MET's Reviewed MET's, goals and home ExRx REVD MET's   Duration Continue with 30 min of aerobic exercise without signs/symptoms of physical distress. Continue with 30 min of aerobic exercise without signs/symptoms of physical distress. Continue with 30 min of aerobic exercise without signs/symptoms of physical distress. Continue with 30 min of aerobic exercise without signs/symptoms of physical distress. Continue with 30 min of aerobic exercise without signs/symptoms of physical distress.   Intensity THRR unchanged THRR unchanged THRR unchanged THRR unchanged THRR unchanged     Progression   Progression Continue to progress workloads to maintain intensity without signs/symptoms of physical distress. Continue to progress workloads to maintain intensity without signs/symptoms of physical distress. Continue to progress workloads to maintain intensity without signs/symptoms of physical distress. Continue to progress workloads to maintain intensity without signs/symptoms of physical distress. Continue to progress workloads to maintain intensity without signs/symptoms of physical distress.   Average METs 4.37 4.53 5.24 4.68 5.28     Resistance Training   Training Prescription Yes Yes Yes Yes No   Weight 3 3 5 7  --   Reps 10-15 10-15 10-15 10-15 --   Time -- 10 Minutes 10 Minutes 10 Minutes --     Treadmill   MPH 2.8 3.2 3.4 3.2 3.2   Grade 0 0 1 0 0   Minutes 15 15 15 15 15    METs 3.14 3.45 4.07 3.45 3.45     Elliptical   Level 1 2 2 2 2    Speed 1 1 3 3 3    Minutes 15 15 15  15 15    METs 5.6 5.6 6.4 5.9 7.1     Home Exercise Plan   Plans to continue exercise at -- -- -- [employer] (comment) [employer] (comment)   Frequency -- -- -- Add 2 additional days to program exercise sessions. Add 2 additional days to program exercise sessions.   Initial Home Exercises Provided -- -- -- 03/23/23 03/23/23    Row Name 04/29/23 1631             Response to Exercise   Blood Pressure (Admit) 100/72       Blood Pressure (Exit) 98/70       Heart Rate (Admit) 92 bpm       Heart Rate (Exercise) 136 bpm       Heart Rate (Exit) 101 bpm       Rating of Perceived Exertion (Exercise) 10       Perceived Dyspnea (Exercise) 0       Symptoms none       Comments REVD MET's and goals       Duration Continue with 30 min of aerobic exercise without signs/symptoms of physical distress.       Intensity THRR unchanged  Progression   Progression Continue to progress workloads to maintain intensity without signs/symptoms of physical distress.       Average METs 6.18         Resistance Training   Training Prescription No         Treadmill   MPH 3.6       Grade 2       Minutes 15       METs 4.75         Elliptical   Level 3       Speed 4       Minutes 15       METs 7.6         Home Exercise Plan   Plans to continue exercise at [facility] (comment)       Frequency Add 2 additional days to program exercise sessions.       Initial Home Exercises Provided 03/23/23                Exercise Comments:   Exercise Comments     Row Name 02/09/23 1701 02/23/23 1635 03/09/23 1629 03/23/23 1642 04/08/23 1626   Exercise Comments Pt first day in CRP2 program. Pt tolerated exercise well with an avg MET level of 4.37 with no s/sx. Pt is learning her THRR, RPE scale, and ExRx. Pt did great and is eager to progress workloads. reviewed MET's and goals. Pt tolerated exercise well with an avg MET level of 4.53. We are working together to increase WL's  without exceeding THRR, she has been able to make gradual increases and is doing well. She feels good about her goals of feeling more confident with exercise. reviewed MET's today. Pt tolerated exercise well with an avg MET level of 5.24. Continuing to work together on gradual increased, but her heart is gaining tolerance to exercise. She feels good with her progress so far Reviewed MET's, home ExRx and goals. Pt tolerated exercise well with an avg MET level of 4.68. We will work toward gradual increases as HR allows. She is doing well and talked over home exercise in great detail and weight lifting progression. She will go to PF, weights walking and gym equipment for 1-2 day for 30-45 mins per session Reviewed MET's today. Pt tolerated exercise well with an avg MET level of 5.28. were working together to track THRR for gradual increases. She's feeling good with exercise    Row Name 04/29/23 1635           Exercise Comments Reviewed MET's and goals. Pt tolerated exercise well with an avg MET level of 6.18. We have been working to increase WL with heart rate tolerance and she has been feeling good with exercise. She is exercising on her own more and has a walking buddy. Overall she is feeling more confident and is increasing MET's                Exercise Goals and Review:   Exercise Goals     Row Name 01/26/23 1359             Exercise Goals   Increase Physical Activity Yes       Intervention Provide advice, education, support and counseling about physical activity/exercise needs.;Develop an individualized exercise prescription for aerobic and resistive training based on initial evaluation findings, risk stratification, comorbidities and participant's personal goals.       Expected Outcomes Short Term: Attend rehab on a regular basis to increase amount of physical  activity.;Long Term: Exercising regularly at least 3-5 days a week.;Long Term: Add in home exercise to make exercise part of  routine and to increase amount of physical activity.       Increase Strength and Stamina Yes       Intervention Develop an individualized exercise prescription for aerobic and resistive training based on initial evaluation findings, risk stratification, comorbidities and participant's personal goals.;Provide advice, education, support and counseling about physical activity/exercise needs.       Expected Outcomes Short Term: Increase workloads from initial exercise prescription for resistance, speed, and METs.;Short Term: Perform resistance training exercises routinely during rehab and add in resistance training at home;Long Term: Improve cardiorespiratory fitness, muscular endurance and strength as measured by increased METs and functional capacity ( )       Able to understand and use rate of perceived exertion (RPE) scale Yes       Intervention Provide education and explanation on how to use RPE scale       Expected Outcomes Short Term: Able to use RPE daily in rehab to express subjective intensity level;Long Term:  Able to use RPE to guide intensity level when exercising independently       Knowledge and understanding of Target Heart Rate Range (THRR) Yes       Intervention Provide education and explanation of THRR including how the numbers were predicted and where they are located for reference       Expected Outcomes Short Term: Able to state/look up THRR;Short Term: Able to use daily as guideline for intensity in rehab;Long Term: Able to use THRR to govern intensity when exercising independently       Understanding of Exercise Prescription Yes       Intervention Provide education, explanation, and written materials on patient's individual exercise prescription       Expected Outcomes Short Term: Able to explain program exercise prescription;Long Term: Able to explain home exercise prescription to exercise independently                Exercise Goals Re-Evaluation :  Exercise Goals  Re-Evaluation     Row Name 02/09/23 1659 02/23/23 1632 03/23/23 1637 04/29/23 1633       Exercise Goal Re-Evaluation   Exercise Goals Review Increase Physical Activity;Understanding of Exercise Prescription;Increase Strength and Stamina;Knowledge and understanding of Target Heart Rate Range (THRR);Able to understand and use rate of perceived exertion (RPE) scale Increase Physical Activity;Understanding of Exercise Prescription;Increase Strength and Stamina;Knowledge and understanding of Target Heart Rate Range (THRR);Able to understand and use rate of perceived exertion (RPE) scale Increase Physical Activity;Understanding of Exercise Prescription;Increase Strength and Stamina;Knowledge and understanding of Target Heart Rate Range (THRR);Able to understand and use rate of perceived exertion (RPE) scale Increase Physical Activity;Understanding of Exercise Prescription;Increase Strength and Stamina;Knowledge and understanding of Target Heart Rate Range (THRR);Able to understand and use rate of perceived exertion (RPE) scale    Comments Pt first day in CRP2 program. Pt tolerated exercise well with an avg MET level of 4.37 with no s/sx. Pt is learning her THRR, RPE scale, and ExRx. Pt did great and is eager to progress workloads. reviewed MET's and goals. Pt tolerated exercise well with an avg MET level of 4.53. We are working together to increase WL's without exceeding THRR, she has been able to make gradual increases and is doing well. She feels good about her goals of feeling more confident with exercise. Reviewed MET's, home ExRx and goals. Pt tolerated exercise well with an avg MET  level of 4.68. We will work toward gradual increases as HR allows. She is doing well and talked over home exercise in great detail and weight lifting progression. She will go to PF, weights walking and gym equipment for 1-2 day for 30-45 mins per session Reviewed MET's and goals. Pt tolerated exercise well with an avg MET level of  6.18. We have been working to increase WL with heart rate tolerance and she has been feeling good with exercise. She is exercising on her own more and has a walking buddy. Overall she is feeling more confident and is increasing MET's    Expected Outcomes Will progress workloads as tolerated without s/sx. Will continue to monitor pt and progress workloads as tolerated without sign or symptom Will continue to monitor pt and progress workloads as tolerated without sign or symptom Will continue to monitor pt and progress workloads as tolerated without sign or symptom             Discharge Exercise Prescription (Final Exercise Prescription Changes):  Exercise Prescription Changes - 04/29/23 1631       Response to Exercise   Blood Pressure (Admit) 100/72    Blood Pressure (Exit) 98/70    Heart Rate (Admit) 92 bpm    Heart Rate (Exercise) 136 bpm    Heart Rate (Exit) 101 bpm    Rating of Perceived Exertion (Exercise) 10    Perceived Dyspnea (Exercise) 0    Symptoms none    Comments REVD MET's and goals    Duration Continue with 30 min of aerobic exercise without signs/symptoms of physical distress.    Intensity THRR unchanged      Progression   Progression Continue to progress workloads to maintain intensity without signs/symptoms of physical distress.    Average METs 6.18      Resistance Training   Training Prescription No      Treadmill   MPH 3.6    Grade 2    Minutes 15    METs 4.75      Elliptical   Level 3    Speed 4    Minutes 15    METs 7.6      Home Exercise Plan   Plans to continue exercise at [facility] (comment)    Frequency Add 2 additional days to program exercise sessions.    Initial Home Exercises Provided 03/23/23             Nutrition:  Target Goals: Understanding of nutrition guidelines, daily intake of sodium 1500mg , cholesterol 200mg , calories 30% from fat and 7% or less from saturated fats, daily to have 5 or more servings of fruits and  vegetables.  Biometrics:  Pre Biometrics - 01/26/23 1529       Pre Biometrics   Waist Circumference 34 inches    Hip Circumference 43 inches    Waist to Hip Ratio 0.79 %    Triceps Skinfold 23 mm    % Body Fat 36.4 %    Grip Strength 24 kg    Flexibility 19.5 in    Single Leg Stand 30 seconds              Nutrition Therapy Plan and Nutrition Goals:  Nutrition Therapy & Goals - 04/14/23 1128       Personal Nutrition Goals   Nutrition Goal Patient to identify strategies for reducing cardiovascular risk by attending the [facility] education and nutrition series weekly.   goal in progress.   Personal Goal #2 Patient to  improve diet quality by using the plate method as a guide for meal planning to include lean protein/plant protein, fruits, vegetables, whole grains, nonfat dairy as part of a well-balanced diet.   goal in progress.   Comments Goals in progress. Tifanny has medical history of Congestive Systolic Heart Failure, HTN, ETOH use disorder. HTN remains well controlled per cardiology notes 02/17/23. She continues to work on reducing alcohol intake. She has maintained her weight since starting with our program. Lipids WNL. She continues to attend the [facility] education and nutrition series regularly. Patient will continue to benefit from participation in intensive cardiac rehab for nutrition, exercise, and lifestyle modification.      Intervention Plan   Intervention Prescribe, educate and counsel regarding individualized specific dietary modifications aiming towards targeted core components such as weight, hypertension, lipid management, diabetes, heart failure and other comorbidities.;Nutrition handout(s) given to patient.    Expected Outcomes Short Term Goal: Understand basic principles of dietary content, such as calories, fat, sodium, cholesterol and nutrients.;Long Term Goal: Adherence to prescribed nutrition plan.             Nutrition Assessments:  Nutrition Assessments -  03/26/23 0911       Rate Your Plate Scores   Pre Score 54            MEDIFICTS Score Key: >=70 Need to make dietary changes  40-70 Heart Healthy Diet <= 40 Therapeutic Level Cholesterol Diet   Flowsheet Row INTENSIVE CARDIAC REHAB from 03/23/2023 in [facility] for Heart, Vascular, & Lung Health  Picture Your Plate Total Score on Admission 54      Picture Your Plate Scores: <11 Unhealthy dietary pattern with much room for improvement. 41-50 Dietary pattern unlikely to meet recommendations for good health and room for improvement. 51-60 More healthful dietary pattern, with some room for improvement.  >60 Healthy dietary pattern, although there may be some specific behaviors that could be improved.    Nutrition Goals Re-Evaluation:  Nutrition Goals Re-Evaluation     Row Name 04/14/23 1128             Goals   Current Weight 168 lb 3.4 oz (76.3 kg)       Comment Lpa 125, A1c WNL, LDL 96, Cr 1.14       Expected Outcome Goals in progress. Hannah Bullock has medical history of Congestive Systolic Heart Failure, HTN, ETOH use disorder. HTN remains well controlled per cardiology notes 02/17/23. She continues to work on reducing alcohol intake. She has maintained her weight since starting with our program. Lipids WNL. She continues to attend the [facility] education and nutrition series regularly. Patient will continue to benefit from participation in intensive cardiac rehab for nutrition, exercise, and lifestyle modification.                Nutrition Goals Re-Evaluation:  Nutrition Goals Re-Evaluation     Row Name 04/14/23 1128             Goals   Current Weight 168 lb 3.4 oz (76.3 kg)       Comment Lpa 125, A1c WNL, LDL 96, Cr 1.14       Expected Outcome Goals in progress. Hannah Bullock has medical history of Congestive Systolic Heart Failure, HTN, ETOH use disorder. HTN remains well controlled per cardiology notes 02/17/23. She continues to work on reducing alcohol  intake. She has maintained her weight since starting with our program. Lipids WNL. She continues to attend the [facility] education and  nutrition series regularly. Patient will continue to benefit from participation in intensive cardiac rehab for nutrition, exercise, and lifestyle modification.                Nutrition Goals Discharge (Final Nutrition Goals Re-Evaluation):  Nutrition Goals Re-Evaluation - 04/14/23 1128       Goals   Current Weight 168 lb 3.4 oz (76.3 kg)    Comment Lpa 125, A1c WNL, LDL 96, Cr 1.14    Expected Outcome Goals in progress. Hannah Bullock has medical history of Congestive Systolic Heart Failure, HTN, ETOH use disorder. HTN remains well controlled per cardiology notes 02/17/23. She continues to work on reducing alcohol intake. She has maintained her weight since starting with our program. Lipids WNL. She continues to attend the [facility] education and nutrition series regularly. Patient will continue to benefit from participation in intensive cardiac rehab for nutrition, exercise, and lifestyle modification.             Psychosocial: Target Goals: Acknowledge presence or absence of significant depression and/or stress, maximize coping skills, provide positive support system. Participant is able to verbalize types and ability to use techniques and skills needed for reducing stress and depression.  Initial Review & Psychosocial Screening:  Initial Psych Review & Screening - 01/26/23 1530       Initial Review   Current issues with Current Anxiety/Panic;Current Stress Concerns    Source of Stress Concerns Chronic Illness    Comments Hannah Bullock shared that she occassional anxiety and some stress associated with her recent diagnosis while balancing work and school. She states that her current medications are working and she has a Paramedic but isn't actively seeing them. Denies feelings of depression or need for additional resources.      Family Dynamics   Good Support System?  Yes   Hannah Bullock has her husband for support     Barriers   Psychosocial barriers to participate in program The patient should benefit from training in stress management and relaxation.      Screening Interventions   Interventions To provide support and resources with identified psychosocial needs;Provide feedback about the scores to participant;Encouraged to exercise    Expected Outcomes Long Term goal: The participant improves quality of Life and PHQ9 Scores as seen by post scores and/or verbalization of changes;Short Term goal: Identification and review with participant of any Quality of Life or Depression concerns found by scoring the questionnaire.;Long Term Goal: Stressors or current issues are controlled or eliminated.;Short Term goal: Utilizing psychosocial counselor, staff and physician to assist with identification of specific Stressors or current issues interfering with healing process. Setting desired goal for each stressor or current issue identified.             Quality of Life Scores:  Quality of Life - 01/26/23 1422       Quality of Life   [facility]      Quality of Life Scores   Health/Function Pre 15.93 %    Socioeconomic Pre 22.36 %    Psych/Spiritual Pre 17.58 %    Family Pre 10.13 %    GLOBAL Pre 16.92 %            Scores of 19 and below usually indicate a poorer quality of life in these areas.  A difference of  2-3 points is a clinically meaningful difference.  A difference of 2-3 points in the total score of the Quality of Life Index has been associated with significant improvement in overall  quality of life, self-image, physical symptoms, and general health in studies assessing change in quality of life.  PHQ-9: Review Flowsheet       01/26/2023  Depression screen PHQ 2/9  Decreased Interest 0  Down, Depressed, Hopeless 0  PHQ - 2 Score 0  Altered sleeping 0  Tired, decreased energy 0  Change in appetite 0  Feeling bad or failure about  yourself  0  Trouble concentrating 0  Moving slowly or fidgety/restless 0  Suicidal thoughts 0  PHQ-9 Score 0   Interpretation of Total Score  Total Score Depression Severity:  1-4 = Minimal depression, 5-9 = Mild depression, 10-14 = Moderate depression, 15-19 = Moderately severe depression, 20-27 = Severe depression   Psychosocial Evaluation and Intervention:   Psychosocial Re-Evaluation:  Psychosocial Re-Evaluation     Row Name 02/12/23 0843 02/17/23 1247 03/16/23 1726 04/14/23 1503 05/08/23 1302     Psychosocial Re-Evaluation   Current issues with Current Stress Concerns;Current Anxiety/Panic Current Stress Concerns;Current Anxiety/Panic Current Stress Concerns;Current Anxiety/Panic Current Stress Concerns;Current Anxiety/Panic Current Stress Concerns;Current Anxiety/Panic   Comments Hannah Bullock started cardiac rehab on 02/09/23. Hannah Bullock did not voice any increased concerns or stressors on her first day of exercise. Will review quality of life questionnaire in the upcoming week Reviewed quality of life questionaire on 02/16/23.Hannah Bullock says she is dissatisfied with her health due to her CHF diagnosis. Hannah Bullock says that her shortness of breath has improved some. Hannah Bullock denies being depressed currently. Hannah Bullock does have a therapist that she meets with twice a month. Hannah Bullock denies being depressed currently. Hannah Bullock does have a therapist that she meets with twice a month. Hannah Bullock has not voiced any increased concerns or stressors during exercise at cardiac rehab. Hannah Bullock has not voiced any increased concerns or stressors during exercise at cardiac rehab. Hannah Bullock continues not  to voice any increased concerns or stressors during exercise at cardiac rehab.   Expected Outcomes Hannah Bullock will have controlled or decreased stressors/ anxiety upon completion of cardiac rehab Hannah Bullock will have controlled or decreased stressors/ anxiety upon completion of cardiac rehab Hannah Bullock will have controlled or decreased stressors/ anxiety upon completion of  cardiac rehab Hannah Bullock will have controlled or decreased stressors/ anxiety upon completion of cardiac rehab Hannah Bullock will have controlled or decreased stressors/ anxiety upon completion of cardiac rehab   Interventions Stress management education;Encouraged to attend Cardiac Rehabilitation for the exercise;Relaxation education Stress management education;Encouraged to attend Cardiac Rehabilitation for the exercise;Relaxation education Stress management education;Encouraged to attend Cardiac Rehabilitation for the exercise;Relaxation education Stress management education;Encouraged to attend Cardiac Rehabilitation for the exercise;Relaxation education Stress management education;Encouraged to attend Cardiac Rehabilitation for the exercise;Relaxation education   Continue Psychosocial Services  Follow up required by staff Follow up required by staff No Follow up required No Follow up required No Follow up required     Initial Review   Source of Stress Concerns Chronic Illness Chronic Illness Chronic Illness Chronic Illness Chronic Illness   Comments Will continue to monitor and offer support as needed. Will continue to monitor and offer support as needed. Will continue to monitor and offer support as needed. Will continue to monitor and offer support as needed. Will continue to monitor and offer support as needed.            Psychosocial Discharge (Final Psychosocial Re-Evaluation):  Psychosocial Re-Evaluation - 05/08/23 1302       Psychosocial Re-Evaluation   Current issues with Current Stress Concerns;Current Anxiety/Panic    Comments Hannah Bullock continues not  to voice any  increased concerns or stressors during exercise at cardiac rehab.    Expected Outcomes Hannah Bullock will have controlled or decreased stressors/ anxiety upon completion of cardiac rehab    Interventions Stress management education;Encouraged to attend Cardiac Rehabilitation for the exercise;Relaxation education    Continue Psychosocial Services   No Follow up required      Initial Review   Source of Stress Concerns Chronic Illness    Comments Will continue to monitor and offer support as needed.             Vocational Rehabilitation: Provide vocational rehab assistance to qualifying candidates.   Vocational Rehab Evaluation & Intervention:  Vocational Rehab - 01/26/23 1400       Initial Vocational Rehab Evaluation & Intervention   Assessment shows need for Vocational Rehabilitation No   Hannah Bullock is working            Education: Education Goals: Education classes will be provided on a weekly basis, covering required topics. Participant will state understanding/return demonstration of topics presented.    Education     Row Name 02/09/23 1500     Education   Cardiac Education Topics Pritikin   Geographical information systems officer Psychosocial   Psychosocial Workshop Focused Goals, Sustainable Changes   Instruction Review Code 1- Verbalizes Understanding   Class Start Time 1400   Class Stop Time 1440   Class Time Calculation (min) 40 min    Row Name 02/16/23 1400     Education   Cardiac Education Topics Pritikin   Psychologist, forensic Exercise Education   Exercise Education Biomechanial Limitations   Instruction Review Code 1- Verbalizes Understanding   Class Start Time 1400   Class Stop Time 1440   Class Time Calculation (min) 40 min    Row Name 02/18/23 1500     Education   Cardiac Education Topics Pritikin   Customer service manager   Weekly Topic Comforting Weekend Breakfasts   Instruction Review Code 1- Verbalizes Understanding   Class Start Time 1400   Class Stop Time 1440   Class Time Calculation (min) 40 min    Row Name 02/23/23 1400     Education   Cardiac Education Topics Pritikin   Chief of Staff Exercise Education   Exercise Education Improving Performance   Instruction Review Code 1- Verbalizes Understanding   Class Start Time 1357   Class Stop Time 1439   Class Time Calculation (min) 42 min    Row Name 02/25/23 1600     Education   Cardiac Education Topics Pritikin   Customer service manager   Weekly Topic Fast Evening Meals   Instruction Review Code 1- Verbalizes Understanding   Class Start Time 1400   Class Stop Time 1441   Class Time Calculation (min) 41 min    Row Name 03/04/23 1500     Education   Cardiac Education Topics Pritikin   Customer service manager   Weekly Topic International Cuisine- Spotlight on the [employer] Zones   Instruction Review Code 1- Verbalizes Understanding   Class Start Time 1400   Class Stop Time 1441  Class Time Calculation (min) 41 min    Row Name 03/09/23 1500     Education   Cardiac Education Topics Pritikin   [facility] Workshops     Workshops   Educator Exercise Physiologist   [facility] Exercise   Exercise Workshop Managing Heart Disease: Your Path to a Healthier Heart   Instruction Review Code 1- Verbalizes Understanding   Class Start Time 1355   Class Stop Time 1445   Class Time Calculation (min) 50 min    Row Name 03/18/23 1400     Education   Cardiac Education Topics Pritikin   Secondary school teacher School   Educator Nurse;Respiratory Therapist   Weekly Topic Powerhouse Plant-Based Proteins   Instruction Review Code 1- Verbalizes Understanding   Class Start Time 1358   Class Stop Time 1430   Class Time Calculation (min) 32 min    Row Name 03/23/23 1400     Education   [facility] Workshops     Workshops   Biomedical scientist Exercise   Exercise Workshop Location manager and Fall Prevention   Instruction Review Code 1- Verbalizes Understanding   Class Start Time 1400   Class Stop  Time 1445   Class Time Calculation (min) 45 min    Row Name 04/08/23 1500     Education   Cardiac Education Topics Pritikin   Orthoptist   Educator Dietitian;Nurse   Weekly Topic Personalizing Your Pritikin Plate   Instruction Review Code 1- Verbalizes Understanding   Class Start Time 1400   Class Stop Time 1439   Class Time Calculation (min) 39 min    Row Name 04/13/23 1500     Education   Cardiac Education Topics Pritikin   [facility] Workshops     Workshops   Educator Exercise Physiologist   [facility] Psychosocial   Psychosocial Workshop Recognizing and Reducing Stress   Instruction Review Code 1- Verbalizes Understanding   Class Start Time 1358   Class Stop Time 1445   Class Time Calculation (min) 47 min    Row Name 04/29/23 1500     Education   Cardiac Education Topics Pritikin   Customer service manager   Weekly Topic One-Pot Wonders   Instruction Review Code 1- Verbalizes Understanding   Class Start Time 1400   Class Stop Time 1445   Class Time Calculation (min) 45 min            Core Videos: Exercise    Move It!  Clinical staff conducted group or individual video education with verbal and written material and guidebook.  Patient learns the recommended Pritikin exercise program. Exercise with the goal of living a long, healthy life. Some of the health benefits of exercise include controlled diabetes, healthier blood pressure levels, improved cholesterol levels, improved heart and lung capacity, improved sleep, and better body composition. Everyone should speak with their doctor before starting or changing an exercise routine.  Biomechanical Limitations Clinical staff conducted group or individual video education with verbal and written material and guidebook.  Patient learns how biomechanical limitations can impact exercise and how we can mitigate and possibly overcome limitations to have an  impactful and balanced exercise routine.  Body Composition Clinical staff conducted group or individual video education with verbal and written material and guidebook.  Patient learns that body composition (ratio of muscle mass to fat mass) is a  key component to assessing overall fitness, rather than body weight alone. Increased fat mass, especially visceral belly fat, can put [place] at increased risk for metabolic syndrome, type 2 diabetes, heart disease, and even death. It is recommended to combine diet and exercise (cardiovascular and resistance training) to improve your body composition. Seek guidance from your physician and exercise physiologist before implementing an exercise routine.  Exercise Action Plan Clinical staff conducted group or individual video education with verbal and written material and guidebook.  Patient learns the recommended strategies to achieve and enjoy long-term exercise adherence, including variety, self-motivation, self-efficacy, and positive decision making. Benefits of exercise include fitness, good health, weight management, more energy, better sleep, less stress, and overall well-being.  Medical   Heart Disease Risk Reduction Clinical staff conducted group or individual video education with verbal and written material and guidebook.  Patient learns our heart is our most vital organ as it circulates oxygen, nutrients, white blood cells, and hormones throughout the entire body, and carries waste away. Data supports a plant-based eating plan like the Pritikin Program for its effectiveness in slowing progression of and reversing heart disease. The video provides a number of recommendations to address heart disease.   Metabolic Syndrome and Belly Fat  Clinical staff conducted group or individual video education with verbal and written material and guidebook.  Patient learns what metabolic syndrome is, how it leads to heart disease, and how one can reverse it and keep it  from coming back. You have metabolic syndrome if you have 3 of the following 5 criteria: abdominal obesity, high blood pressure, high triglycerides, low HDL cholesterol, and high blood sugar.  Hypertension and Heart Disease Clinical staff conducted group or individual video education with verbal and written material and guidebook.  Patient learns that high blood pressure, or hypertension, is very common in the [place]. Hypertension is largely due to excessive salt intake, but other important risk factors include being overweight, physical inactivity, drinking too much alcohol, smoking, and not eating enough potassium from fruits and vegetables. High blood pressure is a leading risk factor for heart attack, stroke, congestive heart failure, dementia, kidney failure, and premature death. Long-term effects of excessive salt intake include stiffening of the arteries and thickening of heart muscle and organ damage. Recommendations include ways to reduce hypertension and the risk of heart disease.  Diseases of Our Time - Focusing on Diabetes Clinical staff conducted group or individual video education with verbal and written material and guidebook.  Patient learns why the best way to stop diseases of our time is prevention, through food and other lifestyle changes. Medicine (such as prescription pills and surgeries) is often only a Band-Aid on the problem, not a long-term solution. Most common diseases of our time include obesity, type 2 diabetes, hypertension, heart disease, and cancer. The Pritikin Program is recommended and has been proven to help reduce, reverse, and/or prevent the damaging effects of metabolic syndrome.  Nutrition   Overview of the Pritikin Eating Plan  Clinical staff conducted group or individual video education with verbal and written material and guidebook.  Patient learns about the Pritikin Eating Plan for disease risk reduction. The Pritikin Eating Plan emphasizes a wide  variety of unrefined, minimally-processed carbohydrates, like fruits, vegetables, whole grains, and legumes. Go, Caution, and Stop food choices are explained. Plant-based and lean animal proteins are emphasized. Rationale provided for low sodium intake for blood pressure control, low added sugars for blood sugar stabilization, and low added fats and oils for  coronary artery disease risk reduction and weight management.  Calorie Density  Clinical staff conducted group or individual video education with verbal and written material and guidebook.  Patient learns about calorie density and how it impacts the Pritikin Eating Plan. Knowing the characteristics of the food you choose will help you decide whether those foods will lead to weight gain or weight loss, and whether you want to consume more or less of them. Weight loss is usually a side effect of the Pritikin Eating Plan because of its focus on low calorie-dense foods.  Label Reading  Clinical staff conducted group or individual video education with verbal and written material and guidebook.  Patient learns about the Pritikin recommended label reading guidelines and corresponding recommendations regarding calorie density, added sugars, sodium content, and whole grains.  Dining Out - Part 1  Clinical staff conducted group or individual video education with verbal and written material and guidebook.  Patient learns that restaurant meals can be sabotaging because they can be so high in calories, fat, sodium, and/or sugar. Patient learns recommended strategies on how to positively address this and avoid unhealthy pitfalls.  Facts on Fats  Clinical staff conducted group or individual video education with verbal and written material and guidebook.  Patient learns that lifestyle modifications can be just as effective, if not more so, as many medications for lowering your risk of heart disease. A Pritikin lifestyle can help to reduce your risk of  inflammation and atherosclerosis (cholesterol build-up, or plaque, in the artery walls). Lifestyle interventions such as dietary choices and physical activity address the cause of atherosclerosis. A review of the types of fats and their impact on blood cholesterol levels, along with dietary recommendations to reduce fat intake is also included.  Nutrition Action Plan  Clinical staff conducted group or individual video education with verbal and written material and guidebook.  Patient learns how to incorporate Pritikin recommendations into their lifestyle. Recommendations include planning and keeping personal health goals in mind as an important part of their success.  Healthy Mind-Set    Healthy Minds, Bodies, Hearts  Clinical staff conducted group or individual video education with verbal and written material and guidebook.  Patient learns how to identify when they are stressed. Video will discuss the impact of that stress, as well as the many benefits of stress management. Patient will also be introduced to stress management techniques. The way we think, act, and feel has an impact on our hearts.  How Our Thoughts Can Heal Our Hearts  Clinical staff conducted group or individual video education with verbal and written material and guidebook.  Patient learns that negative thoughts can cause depression and anxiety. This can result in negative lifestyle behavior and serious health problems. Cognitive behavioral therapy is an effective method to help control our thoughts in order to change and improve our emotional outlook.  Additional Videos:  Exercise    Improving Performance  Clinical staff conducted group or individual video education with verbal and written material and guidebook.  Patient learns to use a non-linear approach by alternating intensity levels and lengths of time spent exercising to help burn more calories and lose more body fat. Cardiovascular exercise helps improve heart health,  metabolism, hormonal balance, blood sugar control, and recovery from fatigue. Resistance training improves strength, endurance, balance, coordination, reaction time, metabolism, and muscle mass. Flexibility exercise improves circulation, posture, and balance. Seek guidance from your physician and exercise physiologist before implementing an exercise routine and learn your capabilities and proper  form for all exercise.  Introduction to Yoga  Clinical staff conducted group or individual video education with verbal and written material and guidebook.  Patient learns about yoga, a discipline of the coming together of mind, breath, and body. The benefits of yoga include improved flexibility, improved range of motion, better posture and core strength, increased lung function, weight loss, and positive self-image. Yoga's heart health benefits include lowered blood pressure, healthier heart rate, decreased cholesterol and triglyceride levels, improved immune function, and reduced stress. Seek guidance from your physician and exercise physiologist before implementing an exercise routine and learn your capabilities and proper form for all exercise.  Medical   Aging: Enhancing Your Quality of Life  Clinical staff conducted group or individual video education with verbal and written material and guidebook.  Patient learns key strategies and recommendations to stay in good physical health and enhance quality of life, such as prevention strategies, having an advocate, securing a Health Care Proxy and Power of Attorney, and keeping a list of medications and system for tracking them. It also discusses how to avoid risk for bone loss.  Biology of Weight Control  Clinical staff conducted group or individual video education with verbal and written material and guidebook.  Patient learns that weight gain occurs because we consume more calories than we burn (eating more, moving less). Even if your body weight is normal, you  may have higher ratios of fat compared to muscle mass. Too much body fat puts you at increased risk for cardiovascular disease, heart attack, stroke, type 2 diabetes, and obesity-related cancers. In addition to exercise, following the Pritikin Eating Plan can help reduce your risk.  Decoding Lab Results  Clinical staff conducted group or individual video education with verbal and written material and guidebook.  Patient learns that lab test reflects one measurement whose values change over time and are influenced by many factors, including medication, stress, sleep, exercise, food, hydration, pre-existing medical conditions, and more. It is recommended to use the knowledge from this video to become more involved with your lab results and evaluate your numbers to speak with your doctor.   Diseases of Our Time - Overview  Clinical staff conducted group or individual video education with verbal and written material and guidebook.  Patient learns that according to the CDC, 50% to 70% of chronic diseases (such as obesity, type 2 diabetes, elevated lipids, hypertension, and heart disease) are avoidable through lifestyle improvements including healthier food choices, listening to satiety cues, and increased physical activity.  Sleep Disorders Clinical staff conducted group or individual video education with verbal and written material and guidebook.  Patient learns how good quality and duration of sleep are important to overall health and well-being. Patient also learns about sleep disorders and how they impact health along with recommendations to address them, including discussing with a physician.  Nutrition  Dining Out - Part 2 Clinical staff conducted group or individual video education with verbal and written material and guidebook.  Patient learns how to plan ahead and communicate in order to maximize their dining experience in a healthy and nutritious manner. Included are recommended food choices  based on the type of restaurant the patient is visiting.   Fueling a Banker conducted group or individual video education with verbal and written material and guidebook.  There is a strong connection between our food choices and our health. Diseases like obesity and type 2 diabetes are very prevalent and are in large-part due to lifestyle  choices. The Pritikin Eating Plan provides plenty of food and hunger-curbing satisfaction. It is easy to follow, affordable, and helps reduce health risks.  Menu Workshop  Clinical staff conducted group or individual video education with verbal and written material and guidebook.  Patient learns that restaurant meals can sabotage health goals because they are often packed with calories, fat, sodium, and sugar. Recommendations include strategies to plan ahead and to communicate with the manager, chef, or server to help order a healthier meal.  Planning Your Eating Strategy  Clinical staff conducted group or individual video education with verbal and written material and guidebook.  Patient learns about the Pritikin Eating Plan and its benefit of reducing the risk of disease. The Pritikin Eating Plan does not focus on calories. Instead, it emphasizes high-quality, nutrient-rich foods. By knowing the characteristics of the foods, we choose, we can determine their calorie density and make informed decisions.  Targeting Your Nutrition Priorities  Clinical staff conducted group or individual video education with verbal and written material and guidebook.  Patient learns that lifestyle habits have a tremendous impact on disease risk and progression. This video provides eating and physical activity recommendations based on your personal health goals, such as reducing LDL cholesterol, losing weight, preventing or controlling type 2 diabetes, and reducing high blood pressure.  Vitamins and Minerals  Clinical staff conducted group or individual video  education with verbal and written material and guidebook.  Patient learns different ways to obtain key vitamins and minerals, including through a recommended healthy diet. It is important to discuss all supplements you take with your doctor.   Healthy Mind-Set    Smoking Cessation  Clinical staff conducted group or individual video education with verbal and written material and guidebook.  Patient learns that cigarette smoking and tobacco addiction pose a serious health risk which affects millions of people. Stopping smoking will significantly reduce the risk of heart disease, lung disease, and many forms of cancer. Recommended strategies for quitting are covered, including working with your doctor to develop a successful plan.  Culinary   Becoming a Set designer conducted group or individual video education with verbal and written material and guidebook.  Patient learns that cooking at home can be healthy, cost-effective, quick, and puts them in control. Keys to cooking healthy recipes will include looking at your recipe, assessing your equipment needs, planning ahead, making it simple, choosing cost-effective seasonal ingredients, and limiting the use of added fats, salts, and sugars.  Cooking - Breakfast and Snacks  Clinical staff conducted group or individual video education with verbal and written material and guidebook.  Patient learns how important breakfast is to satiety and nutrition through the entire day. Recommendations include key foods to eat during breakfast to help stabilize blood sugar levels and to prevent overeating at meals later in the day. Planning ahead is also a key component.  Cooking - Educational psychologist conducted group or individual video education with verbal and written material and guidebook.  Patient learns eating strategies to improve overall health, including an approach to cook more at home. Recommendations include thinking of  animal protein as a side on your plate rather than center stage and focusing instead on lower calorie dense options like vegetables, fruits, whole grains, and plant-based proteins, such as beans. Making sauces in large quantities to freeze for later and leaving the skin on your vegetables are also recommended to maximize your experience.  Cooking - Healthy Salads and Dressing Clinical  staff conducted group or individual video education with verbal and written material and guidebook.  Patient learns that vegetables, fruits, whole grains, and legumes are the foundations of the Pritikin Eating Plan. Recommendations include how to incorporate each of these in flavorful and healthy salads, and how to create homemade salad dressings. Proper handling of ingredients is also covered. Cooking - Soups and [employer] - Soups and Desserts Clinical staff conducted group or individual video education with verbal and written material and guidebook.  Patient learns that Pritikin soups and desserts make for easy, nutritious, and delicious snacks and meal components that are low in sodium, fat, sugar, and calorie density, while high in vitamins, minerals, and filling fiber. Recommendations include simple and healthy ideas for soups and desserts.   Overview     The Pritikin Solution Program Overview Clinical staff conducted group or individual video education with verbal and written material and guidebook.  Patient learns that the results of the Pritikin Program have been documented in more than 100 articles published in peer-reviewed journals, and the benefits include reducing risk factors for (and, in some cases, even reversing) high cholesterol, high blood pressure, type 2 diabetes, obesity, and more! An overview of the three key pillars of the Pritikin Program will be covered: eating well, doing regular exercise, and having a healthy mind-set.  WORKSHOPS  Exercise: Exercise Basics: Building Your Action  Plan Clinical staff led group instruction and group discussion with PowerPoint presentation and patient guidebook. To enhance the learning environment the use of posters, models and videos may be added. At the conclusion of this workshop, patients will comprehend the difference between physical activity and exercise, as well as the benefits of incorporating both, into their routine. Patients will understand the FITT (Frequency, Intensity, Time, and Type) principle and how to use it to build an exercise action plan. In addition, safety concerns and other considerations for exercise and cardiac rehab will be addressed by the presenter. The purpose of this lesson is to promote a comprehensive and effective weekly exercise routine in order to improve patients' overall level of fitness.   Managing Heart Disease: Your Path to a Healthier Heart Clinical staff led group instruction and group discussion with PowerPoint presentation and patient guidebook. To enhance the learning environment the use of posters, models and videos may be added.At the conclusion of this workshop, patients will understand the anatomy and physiology of the heart. Additionally, they will understand how Pritikin's three pillars impact the risk factors, the progression, and the management of heart disease.  The purpose of this lesson is to provide a high-level overview of the heart, heart disease, and how the Pritikin lifestyle positively impacts risk factors.  Exercise Biomechanics Clinical staff led group instruction and group discussion with PowerPoint presentation and patient guidebook. To enhance the learning environment the use of posters, models and videos may be added. Patients will learn how the structural parts of their bodies function and how these functions impact their daily activities, movement, and exercise. Patients will learn how to promote a neutral spine, learn how to manage pain, and identify ways to improve  their physical movement in order to promote healthy living. The purpose of this lesson is to expose patients to common physical limitations that impact physical activity. Participants will learn practical ways to adapt and manage aches and pains, and to minimize their effect on regular exercise. Patients will learn how to maintain good posture while sitting, walking, and lifting.  Location manager and Fall Prevention  Clinical staff led group instruction and group discussion with PowerPoint presentation and patient guidebook. To enhance the learning environment the use of posters, models and videos may be added. At the conclusion of this workshop, patients will understand the importance of their sensorimotor skills (vision, proprioception, and the vestibular system) in maintaining their ability to balance as they age. Patients will apply a variety of balancing exercises that are appropriate for their current level of function. Patients will understand the common causes for poor balance, possible solutions to these problems, and ways to modify their physical environment in order to minimize their fall risk. The purpose of this lesson is to teach patients about the importance of maintaining balance as they age and ways to minimize their risk of falling.  WORKSHOPS   Nutrition:  Fueling a Ship broker led group instruction and group discussion with PowerPoint presentation and patient guidebook. To enhance the learning environment the use of posters, models and videos may be added. Patients will review the foundational principles of the Pritikin Eating Plan and understand what constitutes a serving size in each of the food groups. Patients will also learn Pritikin-friendly foods that are better choices when away from home and review make-ahead meal and snack options. Calorie density will be reviewed and applied to three nutrition priorities: weight maintenance, weight loss, and weight  gain. The purpose of this lesson is to reinforce (in a group setting) the key concepts around what patients are recommended to eat and how to apply these guidelines when away from home by planning and selecting Pritikin-friendly options. Patients will understand how calorie density may be adjusted for different weight management goals.  Mindful Eating  Clinical staff led group instruction and group discussion with PowerPoint presentation and patient guidebook. To enhance the learning environment the use of posters, models and videos may be added. Patients will briefly review the concepts of the Pritikin Eating Plan and the importance of low-calorie dense foods. The concept of mindful eating will be introduced as well as the importance of paying attention to internal hunger signals. Triggers for non-hunger eating and techniques for dealing with triggers will be explored. The purpose of this lesson is to provide patients with the opportunity to review the basic principles of the Pritikin Eating Plan, discuss the value of eating mindfully and how to measure internal cues of hunger and fullness using the Hunger Scale. Patients will also discuss reasons for non-hunger eating and learn strategies to use for controlling emotional eating.  Targeting Your Nutrition Priorities Clinical staff led group instruction and group discussion with PowerPoint presentation and patient guidebook. To enhance the learning environment the use of posters, models and videos may be added. Patients will learn how to determine their genetic susceptibility to disease by reviewing their family history. Patients will gain insight into the importance of diet as part of an overall healthy lifestyle in mitigating the impact of genetics and other environmental insults. The purpose of this lesson is to provide patients with the opportunity to assess their personal nutrition priorities by looking at their family history, their own health history  and current risk factors. Patients will also be able to discuss ways of prioritizing and modifying the Pritikin Eating Plan for their highest risk areas  Menu  Clinical staff led group instruction and group discussion with PowerPoint presentation and patient guidebook. To enhance the learning environment the use of posters, models and videos may be added. Using menus brought in from local restaurants, or printed from  online menus, patients will apply the Pritikin dining out guidelines that were presented in the [employer] video. Patients will also be able to practice these guidelines in a variety of provided scenarios. The purpose of this lesson is to provide patients with the opportunity to practice hands-on learning of the Pritikin Dining Out guidelines with actual menus and practice scenarios.  Label Reading Clinical staff led group instruction and group discussion with PowerPoint presentation and patient guidebook. To enhance the learning environment the use of posters, models and videos may be added. Patients will review and discuss the Pritikin label reading guidelines presented in Pritikin's Label Reading Educational series video. Using fool labels brought in from local grocery stores and markets, patients will apply the label reading guidelines and determine if the packaged food meet the Pritikin guidelines. The purpose of this lesson is to provide patients with the opportunity to review, discuss, and practice hands-on learning of the Pritikin Label Reading guidelines with actual packaged food labels. Cooking School  Pritikin's [employer] are designed to teach patients ways to prepare quick, simple, and affordable recipes at home. The importance of nutrition's role in chronic disease risk reduction is reflected in its emphasis in the overall Pritikin program. By learning how to prepare essential core Pritikin Eating Plan recipes, patients will increase control over  what they eat; be able to customize the flavor of foods without the use of added salt, sugar, or fat; and improve the quality of the food they consume. By learning a set of core recipes which are easily assembled, quickly prepared, and affordable, patients are more likely to prepare more healthy foods at home. These workshops focus on convenient breakfasts, simple entres, side dishes, and desserts which can be prepared with minimal effort and are consistent with nutrition recommendations for cardiovascular risk reduction. Cooking [employer] are taught by a Armed forces logistics/support/administrative officer (RD) who has been trained by the [employer]. The chef or RD has a clear understanding of the importance of minimizing - if not completely eliminating - added fat, sugar, and sodium in recipes. Throughout the series of Cooking School Workshop sessions, patients will learn about healthy ingredients and efficient methods of cooking to build confidence in their capability to prepare    Cooking School weekly topics:  Adding Flavor- Sodium-Free  Fast and Healthy Breakfasts  Powerhouse Plant-Based Proteins  Satisfying Salads and Dressings  Simple Sides and Sauces  International Cuisine-Spotlight on the [employer] Zones  Delicious Desserts  Savory Soups  [employer] - Meals in a Astronomer Appetizers and Snacks  Comforting Weekend Breakfasts  One-Pot Wonders   Fast Evening Meals  Landscape architect Your Pritikin Plate  WORKSHOPS   Healthy Mindset (Psychosocial):  Focused Goals, Sustainable Changes Clinical staff led group instruction and group discussion with PowerPoint presentation and patient guidebook. To enhance the learning environment the use of posters, models and videos may be added. Patients will be able to apply effective goal setting strategies to establish at least one personal goal, and then take consistent, meaningful action toward that goal. They will learn to  identify common barriers to achieving personal goals and develop strategies to overcome them. Patients will also gain an understanding of how our mind-set can impact our ability to achieve goals and the importance of cultivating a positive and growth-oriented mind-set. The purpose of this lesson is to provide patients with a deeper understanding of how to set and achieve personal goals, as well  as the tools and strategies needed to overcome common obstacles which may arise along the way.  From Head to Heart: The Power of a Healthy Outlook  Clinical staff led group instruction and group discussion with PowerPoint presentation and patient guidebook. To enhance the learning environment the use of posters, models and videos may be added. Patients will be able to recognize and describe the impact of emotions and mood on physical health. They will discover the importance of self-care and explore self-care practices which may work for them. Patients will also learn how to utilize the 4 C's to cultivate a healthier outlook and better manage stress and challenges. The purpose of this lesson is to demonstrate to patients how a healthy outlook is an essential part of maintaining good health, especially as they continue their cardiac rehab journey.  Healthy Sleep for a Healthy Heart Clinical staff led group instruction and group discussion with PowerPoint presentation and patient guidebook. To enhance the learning environment the use of posters, models and videos may be added. At the conclusion of this workshop, patients will be able to demonstrate knowledge of the importance of sleep to overall health, well-being, and quality of life. They will understand the symptoms of, and treatments for, common sleep disorders. Patients will also be able to identify daytime and nighttime behaviors which impact sleep, and they will be able to apply these tools to help manage sleep-related challenges. The purpose of this lesson is to  provide patients with a general overview of sleep and outline the importance of quality sleep. Patients will learn about a few of the most common sleep disorders. Patients will also be introduced to the concept of "sleep hygiene," and discover ways to self-manage certain sleeping problems through simple daily behavior changes. Finally, the workshop will motivate patients by clarifying the links between quality sleep and their goals of heart-healthy living.   Recognizing and Reducing Stress Clinical staff led group instruction and group discussion with PowerPoint presentation and patient guidebook. To enhance the learning environment the use of posters, models and videos may be added. At the conclusion of this workshop, patients will be able to understand the types of stress reactions, differentiate between acute and chronic stress, and recognize the impact that chronic stress has on their health. They will also be able to apply different coping mechanisms, such as reframing negative self-talk. Patients will have the opportunity to practice a variety of stress management techniques, such as deep abdominal breathing, progressive muscle relaxation, and/or guided imagery.  The purpose of this lesson is to educate patients on the role of stress in their lives and to provide healthy techniques for coping with it.  Learning Barriers/Preferences:  Learning Barriers/Preferences - 01/26/23 1400       Learning Barriers/Preferences   Learning Barriers Sight   glasses   Learning Preferences Audio;Computer/Internet;Group Instruction;Individual Instruction;Pictoral;Skilled Demonstration;Verbal Instruction;Video;Written Material             Education Topics:  Knowledge Questionnaire Score:  Knowledge Questionnaire Score - 01/26/23 1417       Knowledge Questionnaire Score   Pre Score 23/24             Core Components/Risk Factors/Patient Goals at Admission:  Personal Goals and Risk Factors at  Admission - 01/26/23 1400       Core Components/Risk Factors/Patient Goals on Admission    Weight Management Yes;Weight Loss   pt goal   Intervention Weight Management: Develop a combined nutrition and exercise program designed to reach desired caloric  intake, while maintaining appropriate intake of nutrient and fiber, sodium and fats, and appropriate energy expenditure required for the weight goal.;Weight Management: Provide education and appropriate resources to help participant work on and attain dietary goals.    Expected Outcomes Short Term: Continue to assess and modify interventions until short term weight is achieved;Long Term: Adherence to nutrition and physical activity/exercise program aimed toward attainment of established weight goal;Weight Loss: Understanding of general recommendations for a balanced deficit meal plan, which promotes 1-2 lb weight loss per week and includes a negative energy balance of [phone] kcal/d;Understanding recommendations for meals to include 15-35% energy as protein, 25-35% energy from fat, 35-60% energy from carbohydrates, less than 200mg  of dietary cholesterol, 20-35 gm of total fiber daily;Understanding of distribution of calorie intake throughout the day with the consumption of 4-5 meals/snacks    Heart Failure Yes    Intervention Provide a combined exercise and nutrition program that is supplemented with education, support and counseling about heart failure. Directed toward relieving symptoms such as shortness of breath, decreased exercise tolerance, and extremity edema.    Expected Outcomes Improve functional capacity of life;Short term: Attendance in program 2-3 days a week with increased exercise capacity. Reported lower sodium intake. Reported increased fruit and vegetable intake. Reports medication compliance.;Short term: Daily weights obtained and reported for increase. Utilizing diuretic protocols set by physician.;Long term: Adoption of self-care skills  and reduction of barriers for early signs and symptoms recognition and intervention leading to self-care maintenance.    Hypertension Yes    Intervention Provide education on lifestyle modifcations including regular physical activity/exercise, weight management, moderate sodium restriction and increased consumption of fresh fruit, vegetables, and low fat dairy, alcohol moderation, and smoking cessation.;Monitor prescription use compliance.    Expected Outcomes Short Term: Continued assessment and intervention until BP is < 140/7mm HG in hypertensive participants. < 130/54mm HG in hypertensive participants with diabetes, heart failure or chronic kidney disease.;Long Term: Maintenance of blood pressure at goal levels.    Stress Yes    Intervention Offer individual and/or small group education and counseling on adjustment to heart disease, stress management and health-related lifestyle change. Teach and support self-help strategies.;Refer participants experiencing significant psychosocial distress to appropriate mental health specialists for further evaluation and treatment. When possible, include family members and significant others in education/counseling sessions.    Expected Outcomes Short Term: Participant demonstrates changes in health-related behavior, relaxation and other stress management skills, ability to obtain effective social support, and compliance with psychotropic medications if prescribed.;Long Term: Emotional wellbeing is indicated by absence of clinically significant psychosocial distress or social isolation.             Core Components/Risk Factors/Patient Goals Review:   Goals and Risk Factor Review     Row Name 02/12/23 0849 02/17/23 1249 03/16/23 1727 04/14/23 1504 05/08/23 1303     Core Components/Risk Factors/Patient Goals Review   Personal Goals Review Weight Management/Obesity;Heart Failure;Hypertension;Stress Weight Management/Obesity;Heart Failure;Hypertension;Stress  Weight Management/Obesity;Heart Failure;Hypertension;Stress Weight Management/Obesity;Heart Failure;Hypertension;Stress Weight Management/Obesity;Heart Failure;Hypertension;Stress   Review Hannah Bullock started cardiac rehab on 02/09/23. Hannah Bullock did very  well with exercise. Vital signs were stable. Hannah Bullock is off to a good start to exercise.. Vital signs have been stable. Hannah Bullock has been increasing her met levels. Hannah Bullock is doing well with exercise at cardiac rehab. Vital signs have been stable. Dera has been increasing her met levels. Dezirae continues to do  well with exercise at cardiac rehab. Vital signs remain stable. Yousra contiues to  increase her met levels.  Jovana continues to do  well with exercise at cardiac rehab. Vital signs remain stable. Tamee contiues to  increase her met levels. Sonneborn has been absent due to cold symptoms. Solyana's last day of attendance was on 05/01/23   Expected Outcomes [place] will continue to participate in cardiac rehab for exercise, nutrition and lifestyle modifications Gailene will continue to participate in cardiac rehab for exercise, nutrition and lifestyle modifications Luvenia will continue to participate in cardiac rehab for exercise, nutrition and lifestyle modifications Denesia will continue to participate in cardiac rehab for exercise, nutrition and lifestyle modifications Rosalie will continue to participate in cardiac rehab for exercise, nutrition and lifestyle modifications            Core Components/Risk Factors/Patient Goals at Discharge (Final Review):   Goals and Risk Factor Review - 05/08/23 1303       Core Components/Risk Factors/Patient Goals Review   Personal Goals Review Weight Management/Obesity;Heart Failure;Hypertension;Stress    Review Rada continues to do  well with exercise at cardiac rehab. Vital signs remain stable. [place] contiues to  increase her met levels. Pacholski has been absent due to cold symptoms. Amali's last day of attendance was on 05/01/23    Expected Outcomes Damyra  will continue to participate in cardiac rehab for exercise, nutrition and lifestyle modifications             ITP Comments:  ITP Comments     Row Name 01/26/23 1346 02/12/23 0827 02/17/23 1245 03/16/23 1725 04/14/23 1502   ITP Comments Dr. Armanda Magic medical director. Introduction to pritikin education/ intensive cardiac rehab. Initial orientation packet reviewed with patient. 30 Day ITP Review. Rheda started cardiac rehab on 02/09/23. Beitler did very well with exercise. 30 Day ITP Review. Kerri-Anne started cardiac rehab on 02/09/23. [place] is off to a good start with exercise. 30 Day ITP Review. Brittanya has good attendance and particpation in  cardiac rehab . 30 Day ITP Review. Windie has good attendance and particpation in  cardiac rehab .    Row Name 05/08/23 1301           ITP Comments 30 Day ITP Review. Kristina has good particpation in cardiac rehab when in attendance. Kiandra has been absent due to cold symptoms.                Comments: See ITP comments.Thayer Headings RN BSN

## 2023-05-08 NOTE — Telephone Encounter (Signed)
 Patient called out at 12:20pm for 12:30pm class, states she is still sick with a 'nasty cough'.

## 2023-05-11 ENCOUNTER — Encounter (HOSPITAL_COMMUNITY): Admission: RE | Admit: 2023-05-11 | Source: Ambulatory Visit

## 2023-05-11 NOTE — Telephone Encounter (Signed)
 Patient returned call Reports her SBP is normally 120's, over the past week or so has noticed SBP in the low 100's  Weight is stable Reports increase in dizziness Reports dizziness increases after cardiac rehab  Requests an appointment to discuss Add on 4/2

## 2023-05-12 NOTE — Progress Notes (Signed)
 ADVANCED HEART FAILURE FOLLOW UP CLINIC NOTE  Referring Physician: Leilani Able, MD  Primary Care: Leilani Able, MD Primary Cardiologist:  HPI: Hannah Bullock is a 48 y.o. female with a PMH of HTN, chronic ETOH use disorder, generalized anxiety disorder on Adderal, h/o gastric ulcers/GIB, tobacco use, HFrEF who presents for follow up of chronic systolic heart failure.      No previous cardiac history, presented to the ED 10/24 w/ complaints of increasing exertional dyspnea, dry cough and congestion. In the ED was noted to be tachycardic and hypertensive.  Echo showed severely reduced LVEF 25-30%, global HK, LV mildly dilated, RV normal. She was diuresed w/ IV Lasix and placed on HF GDMT/ antihypertensive regimen. Underwent R/LHC which demonstrated no significant coronary disease, mildly elevated left and right heart filling pressures (RA/RVEDP 10 mmHg, PCWP 15 mmHg, LVEDP 18 mmHg and mild to moderately reduced Fick cardiac output/index (CO 3.6 L/min, CI 2.0 L/min/m^2). During the case, she was note to have transient hypotension "suspected to be due to combination of sedation and vasovagal response, successfully treated with normal saline and phenylephrine boluses". She was diuresed further and she was started all 4 pillars of goal-directed medical therapy, however it was noted that several dose reductions were made due to soft BP. She was referred to [facility] at d/c. D/c wt was 167 lb.         SUBJECTIVE:  Patient overall feels well. She has been doing well at cardiac rehab. Has occasional palpitations, they are not bothersome. She is able to work without issues, though has to take more breaks than before. Weight has been stable since discharge, only takes lasix occasionally.   She called this psat Friday to report that she has been feeling dizzy and fatigued recently. Has noticed her BP has gone from SBP120 to 100s.  She returns today for acute visit follow up. Overall feeling well.  NYHA ***. Reports {Symptoms; cardiac:12860::"dyspnea","fatigue"}. Denies {Symptoms; cardiac:12860::"chest pain","dyspnea","fatigue","near-syncope","orthopnea","palpitations","dizziness","abnormal bleeding"}. Able to perform ADLs. Appetite okay. Weight at home ***. BP at home***. Compliant with all medications.   PMH, current medications, allergies, social history, and family history reviewed in epic.  PHYSICAL EXAM: There were no vitals filed for this visit.  GENERAL: Well nourished and in no apparent distress at rest.  HEENT: The mucous membranes are pink and moist.   PULM:  Normal work of breathing, clear to auscultation bilaterally. Respirations are unlabored.  CARDIAC:  JVP: not elevated         Normal rate with regular rhythm. No murmurs, rubs or gallops.  No lower extremity edema.  ABDOMEN: Soft, non-tender, non-distended. NEUROLOGIC: Patient is oriented x3 with no focal or lateralizing neurologic deficits.  PSYCH: Patients affect is appropriate, there is no evidence of anxiety or depression.  SKIN: Warm and dry; no lesions or wounds. Warm and well perfused extremities.  DATA REVIEW  ECG: 12/24: Sinus tachycardia with PVCs 1/25: Sinus tachycardia with strain, minimal PVCs   ECHO: 3/25: EF 45-50%, gHK, G1DD, nl RV, RVSP 24.3, trival MR 10/24: LVEF 25 to 30%, global hypokinesis, mildly dilated.  Normal RV size and function.  Mild to moderate MR.   CATH: 12/08/2022: Left heart catheterization with no angiographically significant coronary disease.  Right heart cath: RA 10, PCWP 15, PA 27/11, 18, assumed Fick cardiac output 3.58/1.98  Heart failure review: - Classification: Heart failure with reduced EF - Etiology: Work up ongoing - NYHA Class: II - Volume status: Euvolemic - ACEi/ARB/ARNI: Currently up-titrating - Aldosterone antagonist:  Maximally tolerated dose - Beta-blocker: Currently up-titrating - Digoxin: Maximally tolerated dose - Hydralazine/Nitrates: Not  indicated - SGLT2i: Maximally tolerated dose - GLP-1: Not a candidate - Advanced therapies: Not needed at this time - ICD: Currently uptitrating GDMT  ASSESSMENT & PLAN:  Chronic Systolic Heart Failure: Echo 10/24 EF 25-30%, RV nl, NICM. Etiology includes ETOH mediated (though not drinking heavily enough likely) +/- Hypertensive Heart Disease (BP markedly elevated on admit) +/- viral (recent proceeding URI symptoms prior to HF symptoms). 3/25 EF significantly improved to 45-50%.  - NYHA Class II. Euvolemic on exam. Tachycardic. BP normotensive *** - Continue Entersto to 49-51 mg bid. - Continue Jardiance 10 mg daily. No GU symptoms - Continue Spironolactone 25 mg daily - Increase coreg to 12.5mg  BID - Continue Lasix 20 mg PRN - discuss importance of avoiding pregnancy   Hypertension  - controlled on current regimen   ETOH Use Disorder  - significantly improved  Follow up with *** in ***  [place] Blease Capaldi, NP 05/12/23

## 2023-05-13 ENCOUNTER — Other Ambulatory Visit (HOSPITAL_COMMUNITY): Payer: Self-pay | Admitting: Cardiology

## 2023-05-13 ENCOUNTER — Encounter (HOSPITAL_COMMUNITY)
Admission: RE | Admit: 2023-05-13 | Discharge: 2023-05-13 | Disposition: A | Discharge status: Home / Self Care | Source: Ambulatory Visit | Attending: Cardiology | Admitting: Cardiology

## 2023-05-13 ENCOUNTER — Encounter (HOSPITAL_COMMUNITY): Payer: Self-pay

## 2023-05-13 ENCOUNTER — Ambulatory Visit (HOSPITAL_COMMUNITY)
Admission: RE | Admit: 2023-05-13 | Discharge: 2023-05-13 | Disposition: A | Discharge status: Home / Self Care | Source: Ambulatory Visit | Attending: Cardiology | Admitting: Cardiology

## 2023-05-13 VITALS — BP 124/86 | HR 97 | Ht 64.0 in | Wt 170.0 lb

## 2023-05-13 DIAGNOSIS — I493 Ventricular premature depolarization: Secondary | ICD-10-CM | POA: Diagnosis not present

## 2023-05-13 DIAGNOSIS — I428 Other cardiomyopathies: Secondary | ICD-10-CM | POA: Diagnosis not present

## 2023-05-13 DIAGNOSIS — R002 Palpitations: Secondary | ICD-10-CM | POA: Insufficient documentation

## 2023-05-13 DIAGNOSIS — R42 Dizziness and giddiness: Secondary | ICD-10-CM | POA: Insufficient documentation

## 2023-05-13 DIAGNOSIS — Z79899 Other long term (current) drug therapy: Secondary | ICD-10-CM | POA: Insufficient documentation

## 2023-05-13 DIAGNOSIS — I10 Essential (primary) hypertension: Secondary | ICD-10-CM | POA: Diagnosis not present

## 2023-05-13 DIAGNOSIS — R000 Tachycardia, unspecified: Secondary | ICD-10-CM | POA: Insufficient documentation

## 2023-05-13 DIAGNOSIS — Z7984 Long term (current) use of oral hypoglycemic drugs: Secondary | ICD-10-CM | POA: Insufficient documentation

## 2023-05-13 DIAGNOSIS — F1010 Alcohol abuse, uncomplicated: Secondary | ICD-10-CM | POA: Diagnosis not present

## 2023-05-13 DIAGNOSIS — I110 Hypertensive heart disease with heart failure: Secondary | ICD-10-CM | POA: Insufficient documentation

## 2023-05-13 DIAGNOSIS — I5022 Chronic systolic (congestive) heart failure: Secondary | ICD-10-CM | POA: Insufficient documentation

## 2023-05-13 DIAGNOSIS — F411 Generalized anxiety disorder: Secondary | ICD-10-CM | POA: Diagnosis not present

## 2023-05-13 LAB — BASIC METABOLIC PANEL WITH GFR
Anion gap: 9 (ref 5–15)
BUN: <5 mg/dL — ABNORMAL LOW (ref 6–20)
CO2: 26 mmol/L (ref 22–32)
Calcium: 9.2 mg/dL (ref 8.9–10.3)
Chloride: 100 mmol/L (ref 98–111)
Creatinine, Ser: 0.9 mg/dL (ref 0.44–1.00)
GFR, Estimated: >60 mL/min (ref >60)
Glucose, Bld: 132 mg/dL — ABNORMAL HIGH (ref 70–99)
Potassium: 3.8 mmol/L (ref 3.5–5.1)
Sodium: 135 mmol/L (ref 135–145)

## 2023-05-13 LAB — MAGNESIUM: Magnesium: 1.9 mg/dL (ref 1.7–2.4)

## 2023-05-13 MED ORDER — CARVEDILOL 6.25 MG PO TABS: 6.2500 mg | ORAL_TABLET | Freq: Two times a day (BID) | ORAL | 3 refills | Status: DC

## 2023-05-13 NOTE — Patient Instructions (Addendum)
 Medication Changes:  DECREASE CARVEDILOL TO 6.25MG  TWICE DAILY   Lab Work:  Labs done today, your results will be available in MyChart, we will contact you for abnormal readings.'  Testing/Procedures:  Your provider has recommended that  you wear a Zio Patch for 14 days.  This monitor will record your heart rhythm for our review.  IF you have any symptoms while wearing the monitor please press the button.  If you have any issues with the patch or you notice a red or orange light on it please call the company at [phone].  Once you remove the patch please mail it back to the company as soon as possible so we can get the results.  REFERRAL TO PHYSICIAN REFERRAL TO EXERCISE PROGRAM- THEY WILL CALL TO GET THIS ARRANGED FOR YOU   Follow-Up in: 1 MONTH AS SCHEDULED WITH APP   At the [facility], you and your health needs are our priority. We have a designated team specialized in the treatment of Heart Failure. This Care Team includes your primary Heart Failure Specialized Cardiologist (physician), Advanced Practice Providers (APPs- Physician Assistants and Nurse Practitioners), and Pharmacist who all work together to provide you with the care you need, when you need it.   You may see any of the following providers on your designated Care Team at your next follow up:  Dr. Arvilla Meres Dr. Marca Ancona Dr. Dorthula Nettles Dr. Theresia Bough Tonye Becket, NP Robbie Lis, [place] [facility] [place], [place] Brynda Peon, NP [place] Lee, NP Karle Plumber, PharmD   Please be sure to bring in all your medications bottles to every appointment.   Need to Contact us:  If you have any questions or concerns before your next appointment please send [place] a message through [place] or call our office at [phone].    TO LEAVE A MESSAGE FOR THE NURSE SELECT OPTION 2, PLEASE LEAVE A MESSAGE INCLUDING: YOUR NAME DATE OF BIRTH CALL BACK NUMBER REASON FOR CALL**this is  important as we prioritize the call backs  YOU WILL RECEIVE A CALL BACK THE SAME DAY AS LONG AS YOU CALL BEFORE 4:00 PM

## 2023-05-13 NOTE — Addendum Note (Signed)
 Encounter addended by: Baird Cancer, RN on: 05/13/2023 12:52 PM  Actions taken: Order list changed, Diagnosis association updated, Clinical Note Signed

## 2023-05-13 NOTE — Progress Notes (Signed)
 Zio patch placed onto patient.  All instructions and information reviewed with patient, they verbalize understanding with no questions.

## 2023-05-15 ENCOUNTER — Encounter (HOSPITAL_COMMUNITY)
Admission: RE | Admit: 2023-05-15 | Discharge: 2023-05-15 | Disposition: A | Discharge status: Home / Self Care | Source: Ambulatory Visit | Attending: Cardiology | Admitting: Cardiology

## 2023-05-15 DIAGNOSIS — I5022 Chronic systolic (congestive) heart failure: Secondary | ICD-10-CM | POA: Diagnosis not present

## 2023-05-15 NOTE — Progress Notes (Signed)
 Discharge Progress Report  Patient Details  Name: Hannah Bullock MRN: [id number] Date of Birth: 1975/10/02 Referring Provider:   Flowsheet Row INTENSIVE CARDIAC REHAB ORIENT from 01/26/2023 in [facility]  Referring Provider Clearnce Hasten, MD        Number of Visits: 37  Reason for Discharge:  Patient reached a stable level of exercise. Patient independent in their exercise. Patient has met program and personal goals.  Smoking History:  Social History   Tobacco Use  Smoking Status Former   Current packs/day: 0.00   Types: Cigarettes   Quit date: 04/06/2016   Years since quitting: 7.1  Smokeless Tobacco Never    Diagnosis:  Heart failure, chronic systolic (HCC)  ADL UCSD:   Initial Exercise Prescription:  Initial Exercise Prescription - 01/26/23 1500       Date of Initial Exercise RX and Referring Provider   Date 01/26/23    Referring Provider Clearnce Hasten, MD    Expected Discharge Date 04/22/23      Treadmill   MPH 2.8    Grade 0    Minutes 15    METs 3.2      Elliptical   Level 1    Speed 1    Minutes 15    METs 3      Prescription Details   Frequency (times per week) 3    Duration Progress to 30 minutes of continuous aerobic without signs/symptoms of physical distress      Intensity   THRR 40-80% of Max Heartrate 69-139    Ratings of Perceived Exertion 11-13    Perceived Dyspnea 0-4      Progression   Progression Continue progressive overload as per policy without signs/symptoms or physical distress.      Resistance Training   Training Prescription Yes    Weight 3    Reps 10-15             Discharge Exercise Prescription (Final Exercise Prescription Changes):  Exercise Prescription Changes - 05/15/23 1421       Response to Exercise   Blood Pressure (Admit) 112/80    Blood Pressure (Exercise) 112/72    Blood Pressure (Exit) 104/70    Heart Rate (Admit) 103 bpm     Heart Rate (Exercise) 144 bpm    Heart Rate (Exit) 113 bpm    Rating of Perceived Exertion (Exercise) 9    Perceived Dyspnea (Exercise) 0    Symptoms none    Comments Pt graduated the [employer] program    Duration Continue with 30 min of aerobic exercise without signs/symptoms of physical distress.    Intensity THRR unchanged      Progression   Progression Continue to progress workloads to maintain intensity without signs/symptoms of physical distress.    Average METs 5.39      Resistance Training   Training Prescription Yes    Weight 6    Reps 10-15    Time 10 Minutes      Elliptical   Level 3    Speed 4    Minutes 15    METs 7.6      Track   Laps 9    Minutes 6    METs 5.39   Post     Home Exercise Plan   Plans to continue exercise at [facility] (comment)    Frequency Add 2 additional days to program exercise sessions.    Initial Home Exercises  Provided 03/23/23             Functional Capacity:  6 Minute Walk     Row Name 01/26/23 1527 05/15/23 1414       6 Minute Walk   Phase Initial Discharge    Distance 1610 feet 1980 feet    Distance Feet Change -- 370 ft    Walk Time 6 minutes 6 minutes    # of Rest Breaks 0 0    MPH 3.05 3.75    METS 4.71 5.39    RPE 9 9    Perceived Dyspnea  0 0    VO2 Peak 16.5 18.85    Symptoms No No    Resting HR 95 bpm 103 bpm    Resting BP 122/84 112/80    Resting Oxygen Saturation  95 % --    Exercise Oxygen Saturation  during 6 min walk 95 % --    Max Ex. HR 112 bpm 132 bpm    Max Ex. BP 126/72 112/72    2 Minute Post BP 122/80 118/76             Psychological, QOL, Others - Outcomes: PHQ 2/9:    05/15/2023    1:14 PM 01/26/2023    2:16 PM  Depression screen PHQ 2/9  Decreased Interest 0 0  Down, Depressed, Hopeless 0 0  PHQ - 2 Score 0 0  Altered sleeping 1 0  Tired, decreased energy 0 0  Change in appetite 0 0  Feeling bad or failure about yourself  0 0  Trouble concentrating 1 0   Moving slowly or fidgety/restless 1 0  Suicidal thoughts 0 0  PHQ-9 Score 3 0  Difficult doing work/chores Not difficult at all     Quality of Life:  Quality of Life - 05/13/23 1656       Quality of Life Scores   Health/Function Post 23.97 %    Socioeconomic Post 22.29 %    Psych/Spiritual Post 21.58 %    Family Post 19.13 %    GLOBAL Post 22.55 %             Personal Goals: Goals established at orientation with interventions provided to work toward goal.  Personal Goals and Risk Factors at Admission - 01/26/23 1400       Core Components/Risk Factors/Patient Goals on Admission    Weight Management Yes;Weight Loss   pt goal   Intervention Weight Management: Develop a combined nutrition and exercise program designed to reach desired caloric intake, while maintaining appropriate intake of nutrient and fiber, sodium and fats, and appropriate energy expenditure required for the weight goal.;Weight Management: Provide education and appropriate resources to help participant work on and attain dietary goals.    Expected Outcomes Short Term: Continue to assess and modify interventions until short term weight is achieved;Long Term: Adherence to nutrition and physical activity/exercise program aimed toward attainment of established weight goal;Weight Loss: Understanding of general recommendations for a balanced deficit meal plan, which promotes 1-2 lb weight loss per week and includes a negative energy balance of [phone] kcal/d;Understanding recommendations for meals to include 15-35% energy as protein, 25-35% energy from fat, 35-60% energy from carbohydrates, less than 200mg  of dietary cholesterol, 20-35 gm of total fiber daily;Understanding of distribution of calorie intake throughout the day with the consumption of 4-5 meals/snacks    Heart Failure Yes    Intervention Provide a combined exercise and nutrition program that is supplemented with education, support and counseling  about heart  failure. Directed toward relieving symptoms such as shortness of breath, decreased exercise tolerance, and extremity edema.    Expected Outcomes Improve functional capacity of life;Short term: Attendance in program 2-3 days a week with increased exercise capacity. Reported lower sodium intake. Reported increased fruit and vegetable intake. Reports medication compliance.;Short term: Daily weights obtained and reported for increase. Utilizing diuretic protocols set by physician.;Long term: Adoption of self-care skills and reduction of barriers for early signs and symptoms recognition and intervention leading to self-care maintenance.    Hypertension Yes    Intervention Provide education on lifestyle modifcations including regular physical activity/exercise, weight management, moderate sodium restriction and increased consumption of fresh fruit, vegetables, and low fat dairy, alcohol moderation, and smoking cessation.;Monitor prescription use compliance.    Expected Outcomes Short Term: Continued assessment and intervention until BP is < 140/27mm HG in hypertensive participants. < 130/55mm HG in hypertensive participants with diabetes, heart failure or chronic kidney disease.;Long Term: Maintenance of blood pressure at goal levels.    Stress Yes    Intervention Offer individual and/or small group education and counseling on adjustment to heart disease, stress management and health-related lifestyle change. Teach and support self-help strategies.;Refer participants experiencing significant psychosocial distress to appropriate mental health specialists for further evaluation and treatment. When possible, include family members and significant others in education/counseling sessions.    Expected Outcomes Short Term: Participant demonstrates changes in health-related behavior, relaxation and other stress management skills, ability to obtain effective social support, and compliance with psychotropic medications if  prescribed.;Long Term: Emotional wellbeing is indicated by absence of clinically significant psychosocial distress or social isolation.              Personal Goals Discharge:  Goals and Risk Factor Review     Row Name 02/12/23 0849 02/17/23 1249 03/16/23 1727 04/14/23 1504 05/08/23 1303     Core Components/Risk Factors/Patient Goals Review   Personal Goals Review Weight Management/Obesity;Heart Failure;Hypertension;Stress Weight Management/Obesity;Heart Failure;Hypertension;Stress Weight Management/Obesity;Heart Failure;Hypertension;Stress Weight Management/Obesity;Heart Failure;Hypertension;Stress Weight Management/Obesity;Heart Failure;Hypertension;Stress   Review Rayaan started cardiac rehab on 02/09/23. Lenetta did very  well with exercise. Vital signs were stable. Maurisa is off to a good start to exercise.. Vital signs have been stable. Donyea has been increasing her met levels. Amayra is doing well with exercise at cardiac rehab. Vital signs have been stable. Kordelia has been increasing her met levels. Brookelle continues to do  well with exercise at cardiac rehab. Vital signs remain stable. Daniyla contiues to  increase her met levels. Aunika continues to do  well with exercise at cardiac rehab. Vital signs remain stable. Elaynah contiues to  increase her met levels. Aydin has been absent due to cold symptoms. Latecia's last day of attendance was on 05/01/23   Expected Outcomes Eyvette will continue to participate in cardiac rehab for exercise, nutrition and lifestyle modifications Alsace will continue to participate in cardiac rehab for exercise, nutrition and lifestyle modifications Charnice will continue to participate in cardiac rehab for exercise, nutrition and lifestyle modifications [place] will continue to participate in cardiac rehab for exercise, nutrition and lifestyle modifications Lashane will continue to participate in cardiac rehab for exercise, nutrition and lifestyle modifications            Exercise Goals and  Review:  Exercise Goals     Row Name 01/26/23 1359             Exercise Goals   Increase Physical Activity Yes       Intervention Provide  advice, education, support and counseling about physical activity/exercise needs.;Develop an individualized exercise prescription for aerobic and resistive training based on initial evaluation findings, risk stratification, comorbidities and participant's personal goals.       Expected Outcomes Short Term: Attend rehab on a regular basis to increase amount of physical activity.;Long Term: Exercising regularly at least 3-5 days a week.;Long Term: Add in home exercise to make exercise part of routine and to increase amount of physical activity.       Increase Strength and Stamina Yes       Intervention Develop an individualized exercise prescription for aerobic and resistive training based on initial evaluation findings, risk stratification, comorbidities and participant's personal goals.;Provide advice, education, support and counseling about physical activity/exercise needs.       Expected Outcomes Short Term: Increase workloads from initial exercise prescription for resistance, speed, and METs.;Short Term: Perform resistance training exercises routinely during rehab and add in resistance training at home;Long Term: Improve cardiorespiratory fitness, muscular endurance and strength as measured by increased METs and functional capacity ( )       Able to understand and use rate of perceived exertion (RPE) scale Yes       Intervention Provide education and explanation on how to use RPE scale       Expected Outcomes Short Term: Able to use RPE daily in rehab to express subjective intensity level;Long Term:  Able to use RPE to guide intensity level when exercising independently       Knowledge and understanding of Target Heart Rate Range (THRR) Yes       Intervention Provide education and explanation of THRR including how the numbers were predicted and where they  are located for reference       Expected Outcomes Short Term: Able to state/look up THRR;Short Term: Able to use daily as guideline for intensity in rehab;Long Term: Able to use THRR to govern intensity when exercising independently       Understanding of Exercise Prescription Yes       Intervention Provide education, explanation, and written materials on patient's individual exercise prescription       Expected Outcomes Short Term: Able to explain program exercise prescription;Long Term: Able to explain home exercise prescription to exercise independently                Exercise Goals Re-Evaluation:  Exercise Goals Re-Evaluation     Row Name 02/09/23 1659 02/23/23 1632 03/23/23 1637 04/29/23 1633 05/15/23 1423     Exercise Goal Re-Evaluation   Exercise Goals Review Increase Physical Activity;Understanding of Exercise Prescription;Increase Strength and Stamina;Knowledge and understanding of Target Heart Rate Range (THRR);Able to understand and use rate of perceived exertion (RPE) scale Increase Physical Activity;Understanding of Exercise Prescription;Increase Strength and Stamina;Knowledge and understanding of Target Heart Rate Range (THRR);Able to understand and use rate of perceived exertion (RPE) scale Increase Physical Activity;Understanding of Exercise Prescription;Increase Strength and Stamina;Knowledge and understanding of Target Heart Rate Range (THRR);Able to understand and use rate of perceived exertion (RPE) scale Increase Physical Activity;Understanding of Exercise Prescription;Increase Strength and Stamina;Knowledge and understanding of Target Heart Rate Range (THRR);Able to understand and use rate of perceived exertion (RPE) scale Increase Physical Activity;Understanding of Exercise Prescription;Increase Strength and Stamina;Knowledge and understanding of Target Heart Rate Range (THRR);Able to understand and use rate of perceived exertion (RPE) scale   Comments Pt first day in [facility]  program. Pt tolerated exercise well with an avg MET level of 4.37 with no s/sx. Pt is learning her THRR,  RPE scale, and ExRx. Pt did great and is eager to progress workloads. reviewed MET's and goals. Pt tolerated exercise well with an avg MET level of 4.53. We are working together to increase WL's without exceeding THRR, she has been able to make gradual increases and is doing well. She feels good about her goals of feeling more confident with exercise. Reviewed MET's, home ExRx and goals. Pt tolerated exercise well with an avg MET level of 4.68. We will work toward gradual increases as HR allows. She is doing well and talked over home exercise in great detail and weight lifting progression. She will go to PF, weights walking and gym equipment for 1-2 day for 30-45 mins per session Reviewed MET's and goals. Pt tolerated exercise well with an avg MET level of 6.18. We have been working to increase WL with heart rate tolerance and she has been feeling good with exercise. She is exercising on her own more and has a walking buddy. Overall she is feeling more confident and is increasing MET's Pt graduated the [employer] [facility] program. Pt tolerated exercise well with an avg MET level of 5.39. Pt did very well and increased MET's. She increased on her post by 370 ft for a total of 1927ft. She will continue to exercise on her own by walking, going to [facility] and maybe [facility]  for 30-60 mins for 4 days a week.   Expected Outcomes Will progress workloads as tolerated without s/sx. Will continue to monitor pt and progress workloads as tolerated without sign or symptom Will continue to monitor pt and progress workloads as tolerated without sign or symptom Will continue to monitor pt and progress workloads as tolerated without sign or symptom Will continue to monitor pt and progress workloads as tolerated without sign or symptom            Nutrition & Weight - Outcomes:  Pre Biometrics - 01/26/23 1529       Pre  Biometrics   Waist Circumference 34 inches    Hip Circumference 43 inches    Waist to Hip Ratio 0.79 %    Triceps Skinfold 23 mm    % Body Fat 36.4 %    Grip Strength 24 kg    Flexibility 19.5 in    Single Leg Stand 30 seconds              Nutrition:  Nutrition Therapy & Goals - 05/15/23 1514       Nutrition Therapy   Diet Heart Healthy Diet      Personal Nutrition Goals   Nutrition Goal Patient to identify strategies for reducing cardiovascular risk by attending the [employer] education and nutrition series weekly.   goal in progress.   Personal Goal #2 Patient to improve diet quality by using the plate method as a guide for meal planning to include lean protein/plant protein, fruits, vegetables, whole grains, nonfat dairy as part of a well-balanced diet.   goal in progress.   Comments Goals in progress. Jeannemarie has medical history of Congestive Systolic Heart Failure, HTN, ETOH use disorder. HTN remains well controlled per cardiology notes 02/17/23. She continues to work on reducing alcohol intake. She has maintained her weight since starting with our program. Lipids WNL. She continues to attend the [employer] education and nutrition series regularly. Easton will continue to benefit from adherence to nutrition, exercise, and lifestyle modification recommendations.      Intervention Plan   Intervention Prescribe, educate and counsel regarding individualized specific dietary  modifications aiming towards targeted core components such as weight, hypertension, lipid management, diabetes, heart failure and other comorbidities.;Nutrition handout(s) given to patient.    Expected Outcomes Short Term Goal: Understand basic principles of dietary content, such as calories, fat, sodium, cholesterol and nutrients.;Long Term Goal: Adherence to prescribed nutrition plan.             Nutrition Discharge:  Nutrition Assessments - 05/13/23 1655       Rate Your Plate Scores   Post Score 69              Education Questionnaire Score:  Knowledge Questionnaire Score - 05/13/23 1652       Knowledge Questionnaire Score   Post Score 23/24             Goals reviewed with patient; copy given to patient. Matie  graduated from  Intensive/Traditional cardiac rehab program on 05/15/23 with completion of  37 exercise and education sessions. Pt maintained good attendance and progressed nicely during their participation in rehab as evidenced by increased MET level. Murielle increased her distance on her post exercise walk test by 370 feet.  Medication list reconciled. Repeat  PHQ score-  3.  Pt has made significant lifestyle changes and should be commended for their success.  Maddeline  achieved her goals during cardiac rehab.   Pt plans to continue exercise at the gym 3 days a week and walking.  Merrilyn says that participating in cardiac rehab has been helpful for her. We are proud of Moani's progress!Thayer Headings RN BSN

## 2023-05-19 ENCOUNTER — Telehealth: Payer: Self-pay

## 2023-05-19 NOTE — Telephone Encounter (Signed)
 Called  re: PREP program referral, left voicemail requesting return call.

## 2023-05-30 DIAGNOSIS — I493 Ventricular premature depolarization: Secondary | ICD-10-CM | POA: Diagnosis not present

## 2023-06-17 ENCOUNTER — Encounter (HOSPITAL_COMMUNITY): Payer: Self-pay

## 2023-06-17 ENCOUNTER — Ambulatory Visit (HOSPITAL_COMMUNITY)
Admission: RE | Admit: 2023-06-17 | Discharge: 2023-06-17 | Disposition: A | Discharge status: Home / Self Care | Source: Ambulatory Visit | Attending: Family Medicine | Admitting: Family Medicine

## 2023-06-17 VITALS — BP 110/70 | HR 108 | Wt 171.0 lb

## 2023-06-17 DIAGNOSIS — R202 Paresthesia of skin: Secondary | ICD-10-CM | POA: Diagnosis not present

## 2023-06-17 DIAGNOSIS — I493 Ventricular premature depolarization: Secondary | ICD-10-CM | POA: Insufficient documentation

## 2023-06-17 DIAGNOSIS — I5022 Chronic systolic (congestive) heart failure: Secondary | ICD-10-CM | POA: Diagnosis not present

## 2023-06-17 DIAGNOSIS — F17200 Nicotine dependence, unspecified, uncomplicated: Secondary | ICD-10-CM | POA: Insufficient documentation

## 2023-06-17 DIAGNOSIS — F411 Generalized anxiety disorder: Secondary | ICD-10-CM | POA: Diagnosis not present

## 2023-06-17 DIAGNOSIS — I10 Essential (primary) hypertension: Secondary | ICD-10-CM

## 2023-06-17 DIAGNOSIS — Z79899 Other long term (current) drug therapy: Secondary | ICD-10-CM | POA: Insufficient documentation

## 2023-06-17 DIAGNOSIS — R200 Anesthesia of skin: Secondary | ICD-10-CM | POA: Insufficient documentation

## 2023-06-17 DIAGNOSIS — R059 Cough, unspecified: Secondary | ICD-10-CM | POA: Diagnosis not present

## 2023-06-17 DIAGNOSIS — R053 Chronic cough: Secondary | ICD-10-CM | POA: Diagnosis not present

## 2023-06-17 DIAGNOSIS — F1090 Alcohol use, unspecified, uncomplicated: Secondary | ICD-10-CM | POA: Insufficient documentation

## 2023-06-17 DIAGNOSIS — I428 Other cardiomyopathies: Secondary | ICD-10-CM | POA: Diagnosis not present

## 2023-06-17 DIAGNOSIS — I110 Hypertensive heart disease with heart failure: Secondary | ICD-10-CM | POA: Insufficient documentation

## 2023-06-17 LAB — BASIC METABOLIC PANEL WITH GFR
Anion gap: 9 (ref 5–15)
BUN: 6 mg/dL (ref 6–20)
CO2: 25 mmol/L (ref 22–32)
Calcium: 9.7 mg/dL (ref 8.9–10.3)
Chloride: 106 mmol/L (ref 98–111)
Creatinine, Ser: 0.88 mg/dL (ref 0.44–1.00)
GFR, Estimated: >60 mL/min (ref >60)
Glucose, Bld: 108 mg/dL — ABNORMAL HIGH (ref 70–99)
Potassium: 4 mmol/L (ref 3.5–5.1)
Sodium: 140 mmol/L (ref 135–145)

## 2023-06-17 LAB — VITAMIN B12: Vitamin B-12: 432 pg/mL (ref 180–914)

## 2023-06-17 LAB — IRON AND TIBC
Iron: 102 ug/dL (ref 28–170)
Saturation Ratios: 24 % (ref 10.4–31.8)
TIBC: 421 ug/dL (ref 250–450)
UIBC: 319 ug/dL

## 2023-06-17 LAB — CBC
HCT: 40.5 % (ref 36.0–46.0)
Hemoglobin: 13.9 g/dL (ref 12.0–15.0)
MCH: 34.4 pg — ABNORMAL HIGH (ref 26.0–34.0)
MCHC: 34.3 g/dL (ref 30.0–36.0)
MCV: 100.2 fL — ABNORMAL HIGH (ref 80.0–100.0)
Platelets: 334 10*3/uL (ref 150–400)
RBC: 4.04 MIL/uL (ref 3.87–5.11)
RDW: 12.1 % (ref 11.5–15.5)
WBC: 5.9 10*3/uL (ref 4.0–10.5)
nRBC: 0 % (ref 0.0–0.2)

## 2023-06-17 LAB — TSH: TSH: 3.546 u[IU]/mL (ref 0.350–4.500)

## 2023-06-17 LAB — MAGNESIUM: Magnesium: 2.1 mg/dL (ref 1.7–2.4)

## 2023-06-17 LAB — FERRITIN: Ferritin: 74 ng/mL (ref 11–307)

## 2023-06-17 MED ORDER — METOPROLOL SUCCINATE ER 25 MG PO TB24: 25.0000 mg | ORAL_TABLET | Freq: Every day | ORAL | 5 refills | Status: DC

## 2023-06-17 NOTE — Progress Notes (Addendum)
 ADVANCED HEART FAILURE CLINIC NOTE  Primary Care: Hannah Crigler, MD HF Cardiologist: Dr. Zenaida  HPI: Hannah Bullock is a 48 y.o. female with a PMH of HTN, chronic ETOH use disorder, generalized anxiety disorder on Adderall, h/o gastric ulcers/GIB, tobacco use, and new HFrEF.  No previous cardiac history.  Admitted 10/24 with new systolic heart failure. Echo showed LVEF 25-30%, global HK, LV mildly dilated, RV normal. She was diuresed w/ IV Lasix  and placed on HF GDMT/ antihypertensive regimen. Underwent R/LHC which demonstrated no significant coronary disease, mildly elevated left and right heart filling pressures (RA/RVEDP 10 mmHg, PCWP 15 mmHg, LVEDP 18 mmHg and mild to moderately reduced Fick cardiac output/index (CO 3.6 L/min, CI 2.0 L/min/m^2). During the case, she was note to have transient hypotension suspected to be due to combination of sedation and vasovagal response, successfully treated with normal saline and phenylephrine  boluses. GDMT further titrated but limited by soft BP. She was referred to [facility] clinic, weight 167 lbs.   Follow up echo 3/25 showed improved EF 45-50%, normal RV. CM felt to be 2/2 to HTN +/- viral.  Acute visit 3/25 with dizziness and fatigue. ECG showed PVCs and Zio placed. Coreg  dose decreased with fatigue. 2 week Zio showed mostly NSR, 2.3% PVC burden.      SUBJECTIVE: Today she returns for HF follow up. Overall feeling fine. SHe has SOB up and down steps, but does TM and elliptical 30 minutes without dyspnea. Rare positional dizziness. Denies palpitations, abnormal bleeding, CP, edema, or PND/Orthopnea. Appetite ok. Weight at home 166 pounds. Taking all medications. Drinks 6 ETOH drinks/day, has had chronic cough x 5 months. Inhaler helps. Having R foot numbness and electric tingling in legs in AM,  looking for new PCP. 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 No kids, lives with husband.  PMH, current medications, allergies, social history, and family  history reviewed in epic.  PHYSICAL EXAM: General:  NAD. No resp difficulty, walked into clinic HEENT: Normal Neck: Supple. No JVD. Cor: Regular rate & rhythm. No rubs, gallops or murmurs. Lungs: Clear Abdomen: Soft, nontender, nondistended.  Extremities: No cyanosis, clubbing, rash, edema Neuro: Alert & oriented x 3, moves all 4 extremities w/o difficulty. Affect pleasant.  BP 110/70   Pulse (!) 108   Wt 77.6 kg (171 lb)   SpO2 98%   BMI 29.35 kg/m   Wt Readings from Last 3 Encounters:  06/17/23 77.6 kg (171 lb)  05/13/23 77.1 kg (170 lb)  04/23/23 77.3 kg (170 lb 6.4 oz)     DATA REVIEW  ECG: 1/25: Sinus tachycardia with strain, minimal PVCs 12/24: Sinus tachycardia with PVCs   ECHO: 3/25: EF 45-50%, gHK, G1DD, nl RV, RVSP 24.3, trival MR 10/24: LVEF 25 to 30%, global hypokinesis, mildly dilated.  Normal RV size and function.  Mild to moderate MR.   CATH: 10/24: LHC: no significant coronary disease. RHC: RA 10, PCWP 15, PA 27/11, 18, assumed Fick cardiac output 3.58/1.983  REDS: N/a  ZIO: 4/25: mostly NSR, 2.3 % PVC burden  ASSESSMENT & PLAN:  Chronic Systolic Heart Failure: Echo 10/24 EF 25-30%, RV nl, NICM. Etiology includes ETOH mediated (though not drinking heavily enough likely) +/- Hypertensive Heart Disease (BP markedly elevated on admit) +/- viral (recent proceeding URI symptoms prior to HF symptoms). Echo 3/25 EF significantly improved to 45-50%.  - NYHA I-II. Euvolemic on exam.  - With elevated HR and soft BP, stop Coreg . - Start Toprol  XL 25 mg daily. - Continue  Entresto  49/51 mg bid. - Continue Jardiance  10 mg daily. No GU symptoms - Continue spironolactone  25 mg daily. - Continue Lasix  20 mg PRN. - Discuss importance of avoiding pregnancy  - Labs today.  PVCs - Palpitations improved. - Zio 4/25 with 2.3% PVC burden. - Needs to cut back on ETOH - Check TSH, CBC and Mag.  HTN - BP stable. - Meds as above.   ETOH Use Disorder  - 6  drinks/day, works as Leisure centre manager. - Discussed cessation. - Should be on MV + thiamine . - May benefit from low dose naltrexone. - Defer to PCP  Cough - symptoms sound like GERD vs esophagitis. - Cut back on ETOH. - Continue loratadine. - Can try OTC PPI. - Defer to PCP.  LE paresthesia - ? RLS vs mineral/vitamin deficiency. - Check B12 and iron studies. - Defer to PCP.  Follow up with Dr. Zenaida as scheduled.  [name], FNP 06/17/23

## 2023-06-17 NOTE — Patient Instructions (Addendum)
 Medication Changes:  STOP CARVEDILOL    START: METOPROLOL  SUCCINATE 25MG  ONCE DAILY   Lab Work:  Labs done today, your results will be available in MyChart, we will contact you for abnormal readings.  Special Instructions // Education:  PCP LIST GIVEN PLEASE REVIEW   Follow-Up in: IN SEPTEMBER WITH DR. Alease Amend PLEASE CALL OUR OFFICE AROUND JULY TO GET SCHEDULED FOR YOUR APPOINTMENT. PHONE NUMBER IS [phone] OPTION 2   At the [facility], you and your health needs are our priority. We have a designated team specialized in the treatment of Heart Failure. This Care Team includes your primary Heart Failure Specialized Cardiologist (physician), Advanced Practice Providers (APPs- Physician Assistants and Nurse Practitioners), and Pharmacist who all work together to provide you with the care you need, when you need it.   You may see any of the following providers on your designated Care Team at your next follow up:  Dr. Jules Oar Dr. Peder Bourdon Dr. Alwin Baars Dr. Judyth Nunnery Nieves Bars, NP Ruddy Corral, [place] [facility] Oakman, [place] Dennise Fitz, NP [place] Lee, NP Luster Salters, PharmD   Please be sure to bring in all your medications bottles to every appointment.   Need to Contact Us :  If you have any questions or concerns before your next appointment please send us  a message through [place] or call our office at [phone].    TO LEAVE A MESSAGE FOR THE NURSE SELECT OPTION 2, PLEASE LEAVE A MESSAGE INCLUDING: YOUR NAME DATE OF BIRTH CALL BACK NUMBER REASON FOR CALL**this is important as we prioritize the call backs  YOU WILL RECEIVE A CALL BACK THE SAME DAY AS LONG AS YOU CALL BEFORE 4:00 PM

## 2023-07-13 ENCOUNTER — Telehealth: Payer: Self-pay

## 2023-07-13 NOTE — Telephone Encounter (Signed)
 Called  re: PREP program referral, left voicemail requesting return call.

## 2023-07-17 DIAGNOSIS — F900 Attention-deficit hyperactivity disorder, predominantly inattentive type: Secondary | ICD-10-CM | POA: Diagnosis not present

## 2023-07-17 DIAGNOSIS — F419 Anxiety disorder, unspecified: Secondary | ICD-10-CM | POA: Diagnosis not present

## 2023-07-17 DIAGNOSIS — Z5181 Encounter for therapeutic drug level monitoring: Secondary | ICD-10-CM | POA: Diagnosis not present

## 2023-09-23 ENCOUNTER — Telehealth (HOSPITAL_COMMUNITY): Payer: Self-pay

## 2023-09-23 NOTE — Telephone Encounter (Signed)
 Advanced Heart Failure Triage Encounter  Patient Name: Hannah Bullock  Date of Call: 09/23/23  Problem:  Patient stats she has a cough some shortness of breath, also noticed some weight gain she in not sure how much. Patient took 40 mg of Lasix  yesterday and has loss 2 pound since. She had not taken any Lasix  today. She would like to be advised my provider what she should do now.    Plan:  Sent to provider for further  review   Rolin LOISE Height, CMA

## 2023-09-23 NOTE — Addendum Note (Signed)
 Encounter addended by: Glena Harlene HERO, FNP on: 09/23/2023 4:52 PM  Actions taken: Clinical Note Signed

## 2023-09-24 NOTE — Telephone Encounter (Signed)
 Patient notified. She states her weight  between 165-170.

## 2023-11-03 ENCOUNTER — Encounter (HOSPITAL_COMMUNITY): Payer: Self-pay | Admitting: Cardiology

## 2023-11-03 ENCOUNTER — Ambulatory Visit (HOSPITAL_COMMUNITY)
Admission: RE | Admit: 2023-11-03 | Discharge: 2023-11-03 | Disposition: A | Discharge status: Home / Self Care | Source: Ambulatory Visit | Attending: Cardiology | Admitting: Cardiology

## 2023-11-03 VITALS — BP 120/88 | HR 101 | Ht 64.0 in | Wt 176.6 lb

## 2023-11-03 DIAGNOSIS — R053 Chronic cough: Secondary | ICD-10-CM | POA: Diagnosis not present

## 2023-11-03 DIAGNOSIS — E669 Obesity, unspecified: Secondary | ICD-10-CM | POA: Diagnosis not present

## 2023-11-03 DIAGNOSIS — Z79899 Other long term (current) drug therapy: Secondary | ICD-10-CM | POA: Diagnosis not present

## 2023-11-03 DIAGNOSIS — I110 Hypertensive heart disease with heart failure: Secondary | ICD-10-CM | POA: Insufficient documentation

## 2023-11-03 DIAGNOSIS — Z7984 Long term (current) use of oral hypoglycemic drugs: Secondary | ICD-10-CM | POA: Diagnosis not present

## 2023-11-03 DIAGNOSIS — R5383 Other fatigue: Secondary | ICD-10-CM | POA: Insufficient documentation

## 2023-11-03 DIAGNOSIS — I493 Ventricular premature depolarization: Secondary | ICD-10-CM | POA: Diagnosis not present

## 2023-11-03 DIAGNOSIS — F411 Generalized anxiety disorder: Secondary | ICD-10-CM | POA: Diagnosis not present

## 2023-11-03 DIAGNOSIS — I5022 Chronic systolic (congestive) heart failure: Secondary | ICD-10-CM | POA: Diagnosis not present

## 2023-11-03 DIAGNOSIS — F1010 Alcohol abuse, uncomplicated: Secondary | ICD-10-CM | POA: Insufficient documentation

## 2023-11-03 MED ORDER — DIGOXIN 125 MCG PO TABS: 0.1250 mg | ORAL_TABLET | Freq: Every day | ORAL | 3 refills | Status: AC

## 2023-11-03 NOTE — Progress Notes (Signed)
 ADVANCED HEART FAILURE FOLLOW UP CLINIC NOTE  Referring Physician: Ilah Crigler, MD  Primary Care: Ilah Crigler, MD Primary Cardiologist:  HPI: Hannah Bullock is a 48 y.o. female with a PMH of HTN, chronic ETOH use disorder, generalized anxiety disorder on Adderal, h/o gastric ulcers/GIB, tobacco use, HFrEF who presents for follow up of chronic systolic heart failure.      No previous cardiac history, presented to the ED 10/24 w/ complaints of increasing exertional dyspnea, dry cough and congestion. In the ED was noted to be tachycardic and hypertensive.  Echo showed severely reduced LVEF 25-30%, global HK, LV mildly dilated, RV normal. She was diuresed w/ IV Lasix  and placed on HF GDMT/ antihypertensive regimen. Underwent R/LHC which demonstrated no significant coronary disease, mildly elevated left and right heart filling pressures (RA/RVEDP 10 mmHg, PCWP 15 mmHg, LVEDP 18 mmHg and mild to moderately reduced Fick cardiac output/index (CO 3.6 L/min, CI 2.0 L/min/m^2). During the case, she was note to have transient hypotension suspected to be due to combination of sedation and vasovagal response, successfully treated with normal saline and phenylephrine  boluses. She was diuresed further and she was started all 4 pillars of goal-directed medical therapy, however it was noted that several dose reductions were made due to soft BP. [facility] d[facility]c. D/c wt was 167 lb.         SUBJECTIVE:  Had two sick visits for symptoms of fatigue and dizziness, coreg  was decreased with some improvement in her symptoms. She is overall doing fairly well, continues to have a chronic cough. Worse in the morning, no change with laying down at night. Discussed trial of PPI given concern for silent GERD overnight.Otherwise doing well with her medications.   PMH, current medications, allergies, social history, and family history reviewed in epic.  PHYSICAL EXAM: Vitals:   11/03/23 1409   BP: 120/88  Pulse: (!) 101  SpO2: 98%    GENERAL: NAD, well appearing PULM:  Normal work of breathing, CTAB CARDIAC:  JVP: flat         Tachycardic rate with regular rhythm. No murmurs, rubs or gallops.  No edema. Warm and well perfused extremities. ABDOMEN: Soft, non-tender, non-distended. NEUROLOGIC: Patient is oriented x3 with no focal or lateralizing neurologic deficits.    DATA REVIEW  ECG: 12/24: Sinus tachycardia with PVCs 1/25: Sinus tachycardia with strain, minimal PVCs   ECHO: 10/24: LVEF 25 to 30%, global hypokinesis, mildly dilated.  Normal RV size and function.  Mild to moderate MR. 04/2023: LVEF 45-50%, grade I DD, normal RV function   CATH: 12/08/2022: Left heart catheterization with no angiographically significant coronary disease.  Right heart cath: RA 10, PCWP 15, PA 27/11, 18, assumed Fick cardiac output 3.58/1.98  ASSESSMENT & PLAN:  Chronic Systolic Heart Failure: Echo 10/24 EF 25-30%, RV nl, NICM. Etiology includes ETOH mediated (though not drinking heavily enough likely) +/- Hypertensive Heart Disease (BP markedly elevated on admit) +/- viral (recent proceeding URI symptoms prior to HF symptoms). Ejection fraction significantly improved on follow up echocardiogram. Given elevated heart rate, worsening fatigue since stopping, will restart digoxin . - Start digoxin  0.125mg  daliy - NYHA class II, euvolemic - Continue Entresto  49/51 mg twice daily -Continue Jardiance  10 mg daily, spironolactone  25 mg daily -Continue carvedilol  6.25 mg twice daily, did not tolerate increase - Continue Lasix  20 mg PRN - discuss importance of avoiding pregnancy   Hypertension  - controlled on current regimen   ETOH Use Disorder  -  significantly improved  Obesity: Asked about GLP-1's in clinic today.  Has had difficulty losing weight. - Safe from a cardiovascular standpoint, likely some benefit given her phenotype of heart failure - GLP-1 referral placed  Morene Brownie,  MD Advanced Heart Failure Mechanical Circulatory Support 11/03/23

## 2023-11-03 NOTE — Patient Instructions (Signed)
 START Digoxin  125 mcg daily.  Your physician recommends that you schedule a follow-up appointment in: 3 months.  If you have any questions or concerns before your next appointment please send us  a message through [place] or call our office at [phone].    TO LEAVE A MESSAGE FOR THE NURSE SELECT OPTION 2, PLEASE LEAVE A MESSAGE INCLUDING: YOUR NAME DATE OF BIRTH CALL BACK NUMBER REASON FOR CALL**this is important as we prioritize the call backs  YOU WILL RECEIVE A CALL BACK THE SAME DAY AS LONG AS YOU CALL BEFORE 4:00 PM  At the [facility], you and your health needs are our priority. As part of our continuing mission to provide you with exceptional heart care, we have created designated Provider Care Teams. These Care Teams include your primary Cardiologist (physician) and Advanced Practice Providers (APPs- Physician Assistants and Nurse Practitioners) who all work together to provide you with the care you need, when you need it.   You may see any of the following providers on your designated Care Team at your next follow up: Dr Toribio Fuel Dr Ezra Shuck Dr. Ria Commander Dr. Morene Brownie Amy Lenetta, NP Caffie Shed, [place] [facility] [place], [place] Beckey Coe, NP [place] Lee, NP Ellouise Class, NP Tinnie Redman, PharmD Jaun Bash, PharmD   Please be sure to bring in all your medications bottles to every appointment.    Thank you for choosing [facility]

## 2023-11-05 ENCOUNTER — Telehealth (HOSPITAL_COMMUNITY): Payer: Self-pay | Admitting: Cardiology

## 2023-11-05 DIAGNOSIS — I5022 Chronic systolic (congestive) heart failure: Secondary | ICD-10-CM

## 2023-11-05 NOTE — Telephone Encounter (Signed)
 Patient called to express interest in GLP1 Medication. Reports this was discussed at [facility] and would like to proceed with process   Ok to place referral

## 2023-11-06 ENCOUNTER — Encounter (HOSPITAL_COMMUNITY): Payer: Self-pay

## 2023-11-06 DIAGNOSIS — I5022 Chronic systolic (congestive) heart failure: Secondary | ICD-10-CM

## 2023-11-11 NOTE — Telephone Encounter (Signed)
 Referral placed.

## 2023-11-19 ENCOUNTER — Other Ambulatory Visit (HOSPITAL_COMMUNITY)

## 2023-11-29 ENCOUNTER — Other Ambulatory Visit (HOSPITAL_COMMUNITY): Payer: Self-pay | Admitting: Internal Medicine

## 2023-12-09 ENCOUNTER — Telehealth (HOSPITAL_COMMUNITY): Payer: Self-pay | Admitting: Cardiology

## 2023-12-09 MED ORDER — SPIRONOLACTONE 25 MG PO TABS: 25.0000 mg | ORAL_TABLET | Freq: Every day | ORAL | 3 refills | Status: AC

## 2023-12-09 NOTE — Telephone Encounter (Signed)
 Refills sent as requested

## 2023-12-22 ENCOUNTER — Other Ambulatory Visit (HOSPITAL_COMMUNITY): Payer: Self-pay | Admitting: Cardiology

## 2023-12-22 MED ORDER — EMPAGLIFLOZIN 10 MG PO TABS: 10.0000 mg | ORAL_TABLET | Freq: Every day | ORAL | 11 refills | Status: AC

## 2023-12-22 MED ORDER — SACUBITRIL-VALSARTAN 49-51 MG PO TABS: 1.0000 | ORAL_TABLET | Freq: Two times a day (BID) | ORAL | 11 refills | Status: AC

## 2023-12-23 ENCOUNTER — Other Ambulatory Visit (HOSPITAL_COMMUNITY): Payer: Self-pay

## 2023-12-23 ENCOUNTER — Telehealth (HOSPITAL_COMMUNITY): Payer: Self-pay

## 2023-12-23 NOTE — Telephone Encounter (Signed)
 Advanced Heart Failure Patient Advocate Encounter  Prior authorization for Jardiance  has been submitted and approved. Test billing returns $60 for 30 day supply. This patient is eligible to use copay savings card.  KeyBETHA KID Effective: 12/23/2023 to 12/22/2024  Rachel DEL, CPhT Rx Patient Advocate Phone: [phone]

## 2023-12-29 ENCOUNTER — Other Ambulatory Visit (HOSPITAL_COMMUNITY): Payer: Self-pay | Admitting: Family Medicine

## 2024-01-12 DIAGNOSIS — F419 Anxiety disorder, unspecified: Secondary | ICD-10-CM | POA: Diagnosis not present

## 2024-01-12 DIAGNOSIS — F900 Attention-deficit hyperactivity disorder, predominantly inattentive type: Secondary | ICD-10-CM | POA: Diagnosis not present

## 2024-01-25 ENCOUNTER — Telehealth (HOSPITAL_COMMUNITY): Payer: Self-pay | Admitting: Cardiology

## 2024-01-25 NOTE — Telephone Encounter (Signed)
 Called to confirm/remind patient of their appointment at the [facility] on 01/25/2024.   Appointment:   [x] Confirmed  [] Left mess   [] No answer/No voice mail  [] VM Full/unable to leave message  [] Phone not in service  Patient reminded to bring all medications and/or complete list.  Confirmed patient has transportation. Gave directions, instructed to utilize valet parking.

## 2024-01-26 ENCOUNTER — Ambulatory Visit (HOSPITAL_COMMUNITY)
Admission: RE | Admit: 2024-01-26 | Discharge: 2024-01-26 | Disposition: A | Discharge status: Home / Self Care | Source: Ambulatory Visit | Attending: Cardiology | Admitting: Cardiology

## 2024-01-26 VITALS — BP 122/78 | HR 104 | Wt 180.2 lb

## 2024-01-26 DIAGNOSIS — F1090 Alcohol use, unspecified, uncomplicated: Secondary | ICD-10-CM

## 2024-01-26 DIAGNOSIS — F411 Generalized anxiety disorder: Secondary | ICD-10-CM | POA: Insufficient documentation

## 2024-01-26 DIAGNOSIS — I10 Essential (primary) hypertension: Secondary | ICD-10-CM | POA: Diagnosis not present

## 2024-01-26 DIAGNOSIS — Z79899 Other long term (current) drug therapy: Secondary | ICD-10-CM | POA: Insufficient documentation

## 2024-01-26 DIAGNOSIS — I110 Hypertensive heart disease with heart failure: Secondary | ICD-10-CM | POA: Insufficient documentation

## 2024-01-26 DIAGNOSIS — E669 Obesity, unspecified: Secondary | ICD-10-CM | POA: Insufficient documentation

## 2024-01-26 DIAGNOSIS — I5022 Chronic systolic (congestive) heart failure: Secondary | ICD-10-CM | POA: Diagnosis not present

## 2024-01-26 NOTE — Progress Notes (Incomplete)
 ADVANCED HEART FAILURE FOLLOW UP CLINIC NOTE  Referring Physician: Ilah Crigler, MD  Primary Care: Ilah Crigler, MD Primary Cardiologist:  HPI: Hannah Bullock is a 48 y.o. female with a PMH of HTN, chronic ETOH use disorder, generalized anxiety disorder on Adderal, h/o gastric ulcers/GIB, tobacco use, HFrEF who presents for follow up of chronic systolic heart failure.      No previous cardiac history, presented to the ED 10/24 w/ complaints of increasing exertional dyspnea, dry cough and congestion. In the ED was noted to be tachycardic and hypertensive.  Echo showed severely reduced LVEF 25-30%, global HK, LV mildly dilated, RV normal. She was diuresed w/ IV Lasix  and placed on HF GDMT/ antihypertensive regimen. Underwent R/LHC which demonstrated no significant coronary disease, mildly elevated left and right heart filling pressures (RA/RVEDP 10 mmHg, PCWP 15 mmHg, LVEDP 18 mmHg and mild to moderately reduced Fick cardiac output/index (CO 3.6 L/min, CI 2.0 L/min/m^2). During the case, she was note to have transient hypotension suspected to be due to combination of sedation and vasovagal response, successfully treated with normal saline and phenylephrine  boluses. She was diuresed further and she was started all 4 pillars of goal-directed medical therapy, however it was noted that several dose reductions were made due to soft BP. She was referred to [facility] clinic at d/c. D/c wt was 167 lb.         SUBJECTIVE:  Had two sick visits for symptoms of fatigue and dizziness, coreg  was decreased with some improvement in her symptoms. She is overall doing fairly well, continues to have a chronic cough. Worse in the morning, no change with laying down at night. Discussed trial of PPI given concern for silent GERD overnight.Otherwise doing well with her medications.   PMH, current medications, allergies, social history, and family history reviewed in epic.  PHYSICAL EXAM: There were no vitals filed  for this visit.   GENERAL: NAD, well appearing PULM:  Normal work of breathing, CTAB CARDIAC:  JVP: flat         Tachycardic rate with regular rhythm. No murmurs, rubs or gallops.  No edema. Warm and well perfused extremities. ABDOMEN: Soft, non-tender, non-distended. NEUROLOGIC: Patient is oriented x3 with no focal or lateralizing neurologic deficits.    DATA REVIEW  ECG: 12/24: Sinus tachycardia with PVCs 1/25: Sinus tachycardia with strain, minimal PVCs   ECHO: 10/24: LVEF 25 to 30%, global hypokinesis, mildly dilated.  Normal RV size and function.  Mild to moderate MR. 04/2023: LVEF 45-50%, grade I DD, normal RV function   CATH: 12/08/2022: Left heart catheterization with no angiographically significant coronary disease.  Right heart cath: RA 10, PCWP 15, PA 27/11, 18, assumed Fick cardiac output 3.58/1.98  ASSESSMENT & PLAN:  Chronic Systolic Heart Failure: Echo 10/24 EF 25-30%, RV nl, NICM. Etiology includes ETOH mediated (though not drinking heavily enough likely) +/- Hypertensive Heart Disease (BP markedly elevated on admit) +/- viral (recent proceeding URI symptoms prior to HF symptoms). Ejection fraction significantly improved on follow up echocardiogram. Given elevated heart rate, worsening fatigue since stopping, will restart digoxin . - Start digoxin  0.125mg  daliy - NYHA class II, euvolemic - Continue Entresto  49/51 mg twice daily -Continue Jardiance  10 mg daily, spironolactone  25 mg daily -Continue carvedilol  6.25 mg twice daily, did not tolerate increase - Continue Lasix  20 mg PRN - discuss importance of avoiding pregnancy   Hypertension  - controlled on current regimen   ETOH Use Disorder  - significantly improved  Obesity: Asked about GLP-1's  in clinic today.  Has had difficulty losing weight. - Safe from a cardiovascular standpoint, likely some benefit given her phenotype of heart failure - GLP-1 referral placed  Morene Brownie, MD Advanced Heart  Failure Mechanical Circulatory Support 01/26/2024

## 2024-01-26 NOTE — Patient Instructions (Signed)
°  Follow-Up in: 6 months with Dr. Zenaida PLEASE CALL OUR OFFICE AROUND MAY 2026 TO GET SCHEDULED FOR YOUR APPOINTMENT. PHONE NUMBER IS [phone] OPTION 2   At the [facility], you and your health needs are our priority. We have a designated team specialized in the treatment of Heart Failure. This Care Team includes your primary Heart Failure Specialized Cardiologist (physician), Advanced Practice Providers (APPs- Physician Assistants and Nurse Practitioners), and Pharmacist who all work together to provide you with the care you need, when you need it.   You may see any of the following providers on your designated Care Team at your next follow up:  Dr. Toribio Fuel Dr. Ezra Shuck Dr. Odis Zenaida Greig Mosses, NP Caffie Shed, [place] [facility] [place], [place] Beckey Coe, NP [place] Lee, NP Tinnie Redman, PharmD   Please be sure to bring in all your medications bottles to every appointment.   Need to Contact Us :  If you have any questions or concerns before your next appointment please send us  a message through [place] or call our office at [phone].    TO LEAVE A MESSAGE FOR THE NURSE SELECT OPTION 2, PLEASE LEAVE A MESSAGE INCLUDING: YOUR NAME DATE OF BIRTH CALL BACK NUMBER REASON FOR CALL**this is important as we prioritize the call backs  YOU WILL RECEIVE A CALL BACK THE SAME DAY AS LONG AS YOU CALL BEFORE 4:00 PM

## 2024-02-05 ENCOUNTER — Ambulatory Visit: Attending: Cardiology | Admitting: Pharmacist Clinician (PhC)/ Clinical Pharmacy Specialist

## 2024-02-05 ENCOUNTER — Telehealth: Payer: Self-pay | Admitting: Pharmacist Clinician (PhC)/ Clinical Pharmacy Specialist

## 2024-02-05 ENCOUNTER — Encounter: Payer: Self-pay | Admitting: Pharmacist Clinician (PhC)/ Clinical Pharmacy Specialist

## 2024-02-05 VITALS — Ht 64.0 in | Wt 176.8 lb

## 2024-02-05 DIAGNOSIS — E66811 Obesity, class 1: Secondary | ICD-10-CM | POA: Diagnosis not present

## 2024-02-05 NOTE — Assessment & Plan Note (Signed)
" °  Patient has not met goal of at least 5% of body weight loss with comprehensive lifestyle modifications alone in the past 3-6 months. Pharmacotherapy is appropriate to pursue as augmentation. Will start Zepbound or [id number].   Confirmed patient not pregnant and no personal or family history of medullary thyroid  carcinoma (MTC) or Multiple Endocrine Neoplasia syndrome type 2 (MEN 2).   Advised patient on common side effects including nausea, diarrhea, dyspepsia, decreased appetite, and fatigue. Counseled patient on reducing meal size and how to titrate medication to minimize side effects. Patient aware to call if intolerable side effects or if experiencing dehydration, abdominal pain, or dizziness. Patient will adhere to dietary modifications and will target at least 150 minutes of moderate intensity exercise weekly, plus resistance training twice a week (as recommended by the American Heart Association). This resistance training--such as weightlifting, bodyweight exercises, or using resistance bands, adapted to the patient's ability--will help prevent muscle loss.  Injection technique reviewed at today's visit.    Follow up in 1-2 days regarding coverage of Zepbound or Wegovy . If therapy is initiated, phone or MyChart follow-ups will be conducted every 4 weeks for dose titration until the patient reaches the effective therapeutic dose and target weight.  "

## 2024-02-05 NOTE — Telephone Encounter (Signed)
 Please try PA for Zepbound or [facility]

## 2024-02-05 NOTE — Progress Notes (Signed)
 "    Office Visit    Patient Name: Hannah Bullock Date of Encounter: 02/05/2024  Primary Care Provider:  Ilah Crigler, MD Primary Cardiologist:  None  Chief Complaint    Weight management  Significant Past Medical History   HTN Controlled with HF GDMT  HFrEF On GDMT, digoxin , furosemide  prn  ETOH 6 or more drinks most days          Allergies[1]  History of Present Illness    Hannah Bullock is a 48 y.o. female patient of Dr Zenaida, in the office today to discuss options for weight management.    Patient reports that she tried SkinnyRx injections (compounded semaglutide) in the past.  Took for 2 weeks, then when dose was increased developed GI issues, nausea and blood in her stool.  She stopped immediately and recovered without issue.   States she would ideally like to weigh around 150 pounds.  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    Current weight management medications: none  Baseline weight/BMI: 80.2 kg // 30.33  Insurance payor:  [employer]  Diet: eats out some, likes [employer], but avoids french fries.  Eats most proteins, less fish than others; vegetables - usually salad; snacks on chips and cookies, also apples with peanut butter, raw vegetables with dip.     Family History: father had heart disease as did paternal grandparents;   Confirmed patient not pregnant and no personal or family history of medullary thyroid  carcinoma (MTC) or Multiple Endocrine Neoplasia syndrome type 2 (MEN 2).   Social History:   Tobacco: quit in 2018  Alcohol: 6 mixed drinks daily  Caffeine: has cut diet coke down now more tea   Accessory Clinical Findings    Lab Results  Component Value Date   CREATININE 0.88 06/17/2023   BUN 6 06/17/2023   NA 140 06/17/2023   K 4.0 06/17/2023   CL 106 06/17/2023   CO2 25 06/17/2023   Lab Results  Component Value Date   ALT 30 12/09/2022   AST 34 12/09/2022   ALKPHOS 69  12/09/2022   BILITOT 0.6 12/09/2022   Lab Results  Component Value Date   HGBA1C 5.1 12/07/2022      Home Medications/Allergies    Current Outpatient Medications  Medication Sig Dispense Refill   albuterol (VENTOLIN HFA) 108 (90 Base) MCG/ACT inhaler Inhale 2 puffs into the lungs every 4 (four) hours as needed.     amphetamine-dextroamphetamine (ADDERALL XR) 20 MG 24 hr capsule Take 20 mg by mouth every morning.     Cholecalciferol (VITAMIN D-3 PO) Take 1 capsule by mouth daily.     DHEA 25 MG CAPS Take 1 capsule by mouth daily.     digoxin  (LANOXIN ) 0.125 MG tablet Take 1 tablet (0.125 mg total) by mouth daily. 90 tablet 3   empagliflozin  (JARDIANCE ) 10 MG TABS tablet Take 1 tablet (10 mg total) by mouth daily. 30 tablet 11   estrogens, conjugated, (PREMARIN) 1.25 MG tablet Take 1.25 mg by mouth daily.     furosemide  (LASIX ) 20 MG tablet TAKE 1 TABLET BY MOUTH DAILY AS NEEDED FOR FLUID OR EDEMA (WEIGHT GAIN OF 3LB IN 1 DAY OR 5LB IN 1 WEEK) 30 tablet 5   hydrOXYzine (ATARAX) 25 MG tablet Take 25-50 mg by mouth at bedtime.     loratadine (CLARITIN) 10 MG tablet Take 10 mg by mouth daily.     metoprolol  succinate (TOPROL -XL) 25  MG 24 hr tablet TAKE 1 TABLET (25 MG TOTAL) BY MOUTH DAILY. 30 tablet 5   progesterone (PROMETRIUM) 100 MG capsule Take 100 mg by mouth daily.     sacubitril -valsartan  (ENTRESTO ) 49-51 MG Take 1 tablet by mouth 2 (two) times daily. 60 tablet 11   spironolactone  (ALDACTONE ) 25 MG tablet Take 1 tablet (25 mg total) by mouth daily. 90 tablet 3   traZODone (DESYREL) 50 MG tablet Take 50-100 mg by mouth at bedtime.     No current facility-administered medications for this visit.     Allergies[2]  Assessment & Plan    Obesity (BMI 30.0-34.9)  Patient has not met goal of at least 5% of body weight loss with comprehensive lifestyle modifications alone in the past 3-6 months. Pharmacotherapy is appropriate to pursue as augmentation. Will start Zepbound or [id number].    Confirmed patient not pregnant and no personal or family history of medullary thyroid  carcinoma (MTC) or Multiple Endocrine Neoplasia syndrome type 2 (MEN 2).   Advised patient on common side effects including nausea, diarrhea, dyspepsia, decreased appetite, and fatigue. Counseled patient on reducing meal size and how to titrate medication to minimize side effects. Patient aware to call if intolerable side effects or if experiencing dehydration, abdominal pain, or dizziness. Patient will adhere to dietary modifications and will target at least 150 minutes of moderate intensity exercise weekly, plus resistance training twice a week (as recommended by the American Heart Association). This resistance training--such as weightlifting, bodyweight exercises, or using resistance bands, adapted to the patient's ability--will help prevent muscle loss.  Injection technique reviewed at today's visit.    Follow up in 1-2 days regarding coverage of Zepbound or Wegovy . If therapy is initiated, phone or MyChart follow-ups will be conducted every 4 weeks for dose titration until the patient reaches the effective therapeutic dose and target weight.   Mia Winthrop PharmD CPP [facility]  [address] [place], [place] [address] [phone]      [1]  Allergies Allergen Reactions   Prednisone Rash  [2]  Allergies Allergen Reactions   Prednisone Rash   "

## 2024-02-05 NOTE — Telephone Encounter (Signed)
 Please do PA for Zepbound?Wegovy - weight loss only, no other indications

## 2024-02-05 NOTE — Patient Instructions (Signed)
 We will start the prior authorization process to get Zepbound or Wegovy covered by your insurance.   TIPS FOR SUCCESS Write down the reasons why you want to lose weight and post it in a place where you'll see it often. Start small and work your way up. Keep in mind that it takes time to achieve goals, and small steps add up. Any additional movements help to burn calories. Taking the stairs rather than the elevator and parking at the far end of your parking lot are easy ways to start. Brisk walking for at least 30 minutes 4 or more days of the week is an excellent goal to work toward  [employer] WHAT IT MEANS TO FEEL FULL Did you know that it can take 15 minutes or more for your brain to receive the message that you've eaten? That means that, if you eat less food, but consume it slower, you may still feel satisfied. Eating a lot of fruits and vegetables can also help you feel fuller. Eat off of smaller plates so that moderate portions don't seem too small  TITRATION PLAN Will plan to follow the titration plan as below, pending patient is tolerating each dose before increasing to the next. Can slow titration if needed for tolerability.   If you have any questions or concerns, please reach out to us .  MyChart is bs  THANK YOU FOR CHOOSING [facility]

## 2024-02-08 ENCOUNTER — Other Ambulatory Visit (HOSPITAL_COMMUNITY): Payer: Self-pay

## 2024-02-08 ENCOUNTER — Encounter (INDEPENDENT_AMBULATORY_CARE_PROVIDER_SITE_OTHER): Payer: Self-pay

## 2024-02-08 NOTE — Telephone Encounter (Signed)
 Pharmacy Patient Advocate Encounter   Received notification from Physician's Office that prior authorization for [facility] is required/requested.   Insurance verification completed.   The patient is insured through [employer].   Per test claim: Per test claim, medication is not covered due to plan/benefit exclusion, PA not submitted at this time  For Zepbound PA approval, we would need a sleep study showing moderate to severe OSA (15 AHI/hr or greater) for approval. Weight loss drugs are excluded on plan.

## 2024-02-17 ENCOUNTER — Telehealth: Payer: Self-pay | Admitting: Pharmacy Technician

## 2024-02-17 ENCOUNTER — Other Ambulatory Visit (HOSPITAL_COMMUNITY): Payer: Self-pay

## 2024-02-17 NOTE — Telephone Encounter (Signed)
 New insurance card for 2026 now in system

## 2024-02-17 NOTE — Telephone Encounter (Signed)
" ° ° °  Pharmacy Patient Advocate Encounter   Received notification from Pt Calls Messages that prior authorization for ZEPBOUND is required/requested.   Insurance verification completed.   The patient is insured through [employer].   Per test claim: PA required; PA submitted to above mentioned insurance via Latent Key/confirmation #/EOC [id number] Status is pending     "

## 2024-02-22 NOTE — Telephone Encounter (Signed)
" ° °  Pharmacy Patient Advocate Encounter  Received notification from [facility] that Prior Authorization for zepbound has been DENIED.  See denial reason below. No denial letter attached in CMM. Will attach denial letter to Media tab once received.    "

## 2024-03-01 ENCOUNTER — Other Ambulatory Visit (HOSPITAL_COMMUNITY): Payer: Self-pay

## 2024-03-01 ENCOUNTER — Telehealth: Payer: Self-pay | Admitting: Pharmacy Technician

## 2024-03-01 NOTE — Telephone Encounter (Signed)
 Pharmacy Patient Advocate Encounter   Received notification from Physician's Office that prior authorization for wegovy tablets is required/requested.   Insurance verification completed.   The patient is insured through [employer].   Per test claim: PA required; PA submitted to above mentioned insurance via Latent Key/confirmation #/EOC [id number] Status is pending

## 2024-03-04 NOTE — Telephone Encounter (Signed)
 Pharmacy Patient Advocate Encounter  Received notification from [facility] that Prior Authorization for [facility] tabs has been DENIED.  Full denial letter will be uploaded to the media tab. See denial reason below.   PA #/Case ID/Reference #: [id number]

## 2024-03-07 MED ORDER — WEGOVY 1.5 MG PO TABS: 1.5000 mg | ORAL_TABLET | Freq: Every day | ORAL | 0 refills | Status: AC

## 2024-03-07 NOTE — Addendum Note (Signed)
 Addended by: Sherrilyn Nairn L on: 03/07/2024 06:31 AM   Modules accepted: Orders
